# Patient Record
Sex: Male | Born: 1935 | Race: White | Hispanic: No | Smoking: Current every day smoker
Health system: Southern US, Community
[De-identification: ages and names within clinical notes are randomized; demographics above are authoritative.]

## PROBLEM LIST (undated history)

## (undated) DIAGNOSIS — R0981 Nasal congestion: Secondary | ICD-10-CM

## (undated) DIAGNOSIS — I639 Cerebral infarction, unspecified: Secondary | ICD-10-CM

## (undated) DIAGNOSIS — R531 Weakness: Secondary | ICD-10-CM

## (undated) DIAGNOSIS — E119 Type 2 diabetes mellitus without complications: Secondary | ICD-10-CM

## (undated) DIAGNOSIS — M1990 Unspecified osteoarthritis, unspecified site: Secondary | ICD-10-CM

## (undated) DIAGNOSIS — F0390 Unspecified dementia without behavioral disturbance: Secondary | ICD-10-CM

## (undated) DIAGNOSIS — F1910 Other psychoactive substance abuse, uncomplicated: Secondary | ICD-10-CM

## (undated) DIAGNOSIS — C801 Malignant (primary) neoplasm, unspecified: Secondary | ICD-10-CM

## (undated) DIAGNOSIS — I10 Essential (primary) hypertension: Secondary | ICD-10-CM

## (undated) DIAGNOSIS — R39198 Other difficulties with micturition: Secondary | ICD-10-CM

## (undated) HISTORY — DX: Malignant (primary) neoplasm, unspecified: C80.1

## (undated) HISTORY — DX: Unspecified dementia, unspecified severity, without behavioral disturbance, psychotic disturbance, mood disturbance, and anxiety: F03.90

## (undated) HISTORY — DX: Weakness: R53.1

## (undated) HISTORY — DX: Nasal congestion: R09.81

## (undated) HISTORY — DX: Essential (primary) hypertension: I10

## (undated) HISTORY — DX: Other psychoactive substance abuse, uncomplicated: F19.10

## (undated) HISTORY — DX: Cerebral infarction, unspecified: I63.9

## (undated) HISTORY — DX: Other difficulties with micturition: R39.198

## (undated) HISTORY — DX: Unspecified osteoarthritis, unspecified site: M19.90

---

## 1998-07-28 ENCOUNTER — Emergency Department (HOSPITAL_COMMUNITY): Admission: EM | Admit: 1998-07-28 | Discharge: 1998-07-28 | Payer: Self-pay | Admitting: Emergency Medicine

## 2003-09-19 ENCOUNTER — Ambulatory Visit: Admission: RE | Admit: 2003-09-19 | Discharge: 2003-10-24 | Payer: Self-pay | Admitting: Radiation Oncology

## 2003-09-23 ENCOUNTER — Inpatient Hospital Stay (HOSPITAL_COMMUNITY): Admission: EM | Admit: 2003-09-23 | Discharge: 2003-10-19 | Payer: Self-pay | Admitting: *Deleted

## 2003-09-29 ENCOUNTER — Encounter (INDEPENDENT_AMBULATORY_CARE_PROVIDER_SITE_OTHER): Payer: Self-pay | Admitting: *Deleted

## 2003-10-04 ENCOUNTER — Encounter (INDEPENDENT_AMBULATORY_CARE_PROVIDER_SITE_OTHER): Payer: Self-pay | Admitting: Cardiology

## 2003-10-26 ENCOUNTER — Inpatient Hospital Stay (HOSPITAL_COMMUNITY): Admission: EM | Admit: 2003-10-26 | Discharge: 2003-11-01 | Payer: Self-pay | Admitting: Emergency Medicine

## 2003-10-29 DIAGNOSIS — C61 Malignant neoplasm of prostate: Secondary | ICD-10-CM

## 2003-12-05 ENCOUNTER — Encounter: Payer: Self-pay | Admitting: *Deleted

## 2003-12-05 ENCOUNTER — Inpatient Hospital Stay (HOSPITAL_COMMUNITY): Admission: EM | Admit: 2003-12-05 | Discharge: 2003-12-08 | Payer: Self-pay | Admitting: *Deleted

## 2004-10-30 ENCOUNTER — Emergency Department (HOSPITAL_COMMUNITY): Admission: EM | Admit: 2004-10-30 | Discharge: 2004-10-30 | Payer: Self-pay | Admitting: Emergency Medicine

## 2005-02-21 ENCOUNTER — Inpatient Hospital Stay (HOSPITAL_COMMUNITY): Admission: EM | Admit: 2005-02-21 | Discharge: 2005-02-25 | Payer: Self-pay | Admitting: Emergency Medicine

## 2005-08-29 IMAGING — CT CT HEAD W/O CM
1 of 2 series · 15 of 30 positions shown, 19 images · non-contrast
Comparison: none

CLINICAL DATA: Status post fall.  Altered mental status. 
 ROUTINE UNENHANCED CT BRAIN 09/23/03
 Moderate age-related atrophy.  The cerebral and cerebellar parenchyma are symmetric and have a normal appearance.  Negative for extraaxial fluid collections.  The mastoid air cells and sinuses are clear.  
 IMPRESSION
 CT brain is negative for acute intracranial hemorrhage or edema.

[Series 2: brain · axial · 0.47mm/px · z∈[+133,+258]mm · 15 of 38 slices shown, 19 images]
[im 3/38  brain]
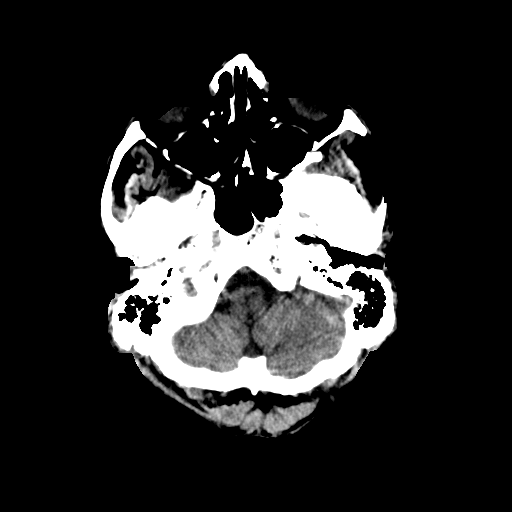
[im 3/38  bone]
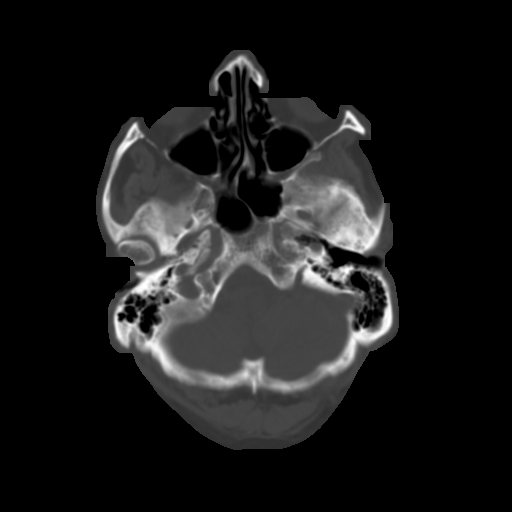
[im 5/38  brain]
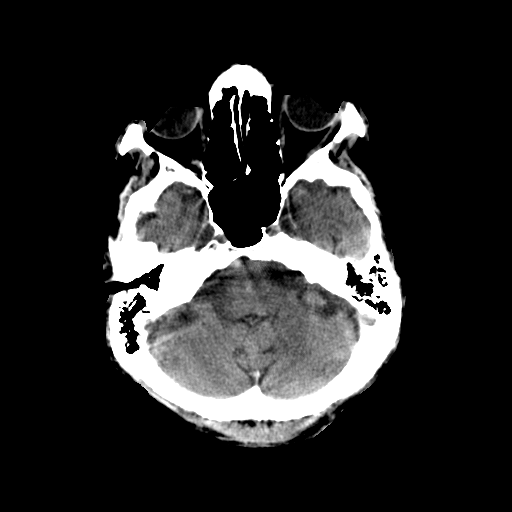
[im 7/38  brain]
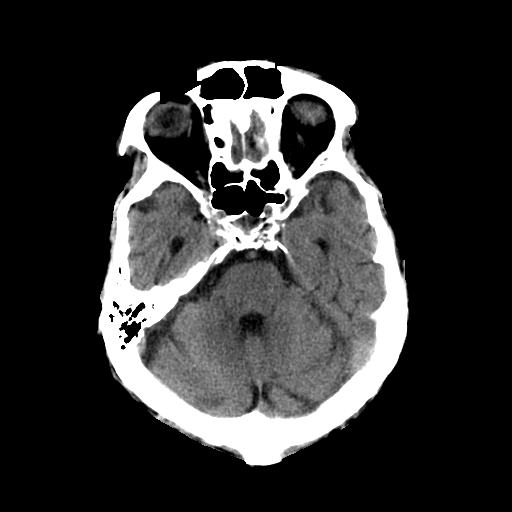
[im 10/38  brain]
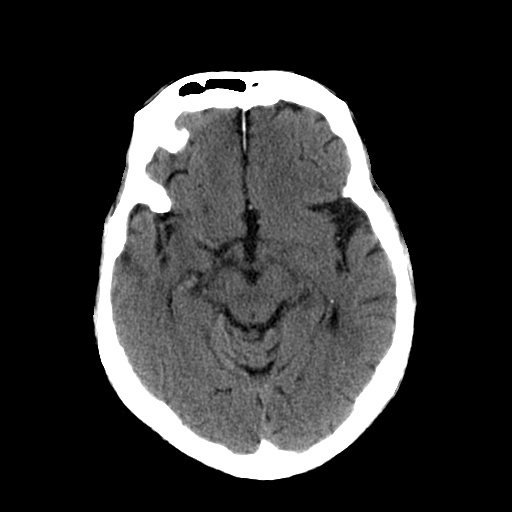
[im 12/38  brain]
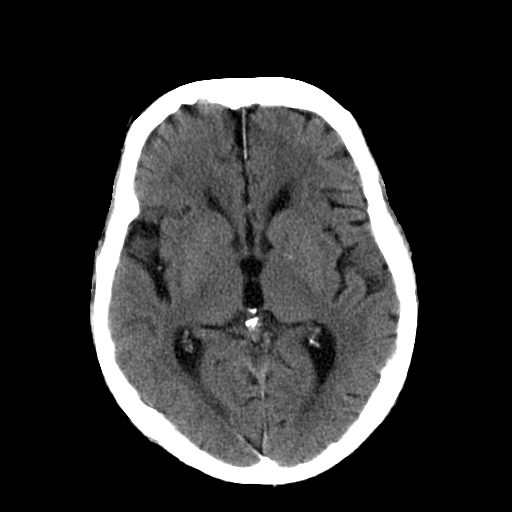
[im 12/38  bone]
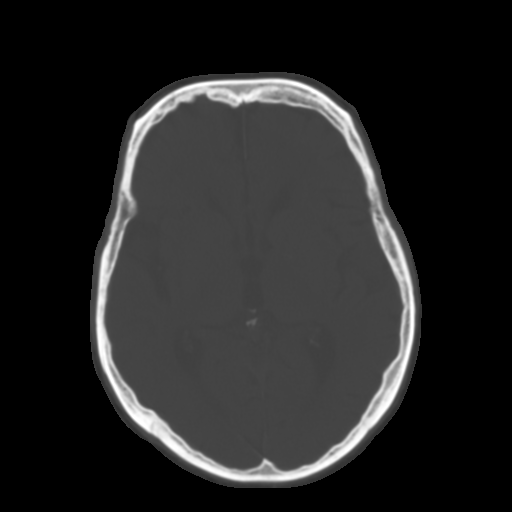
[im 14/38  brain]
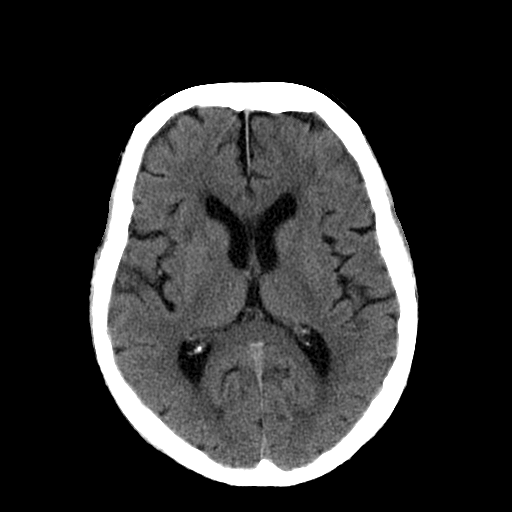
[im 17/38  brain]
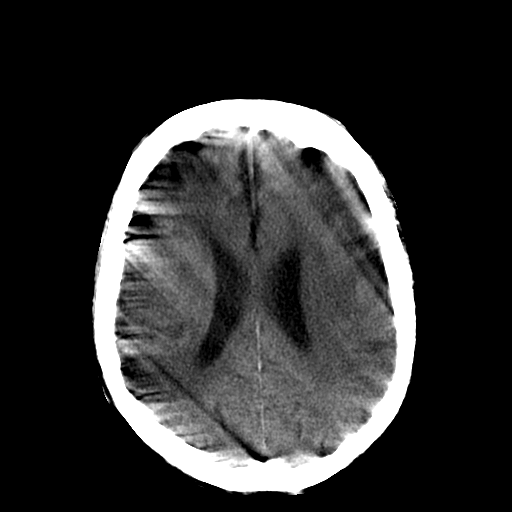
[im 19/38  brain]
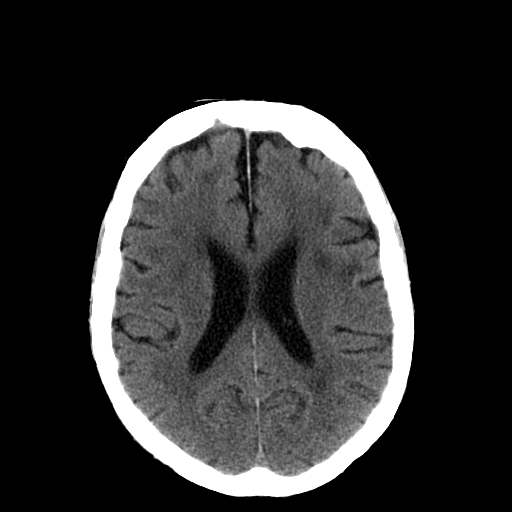
[im 21/38  brain]
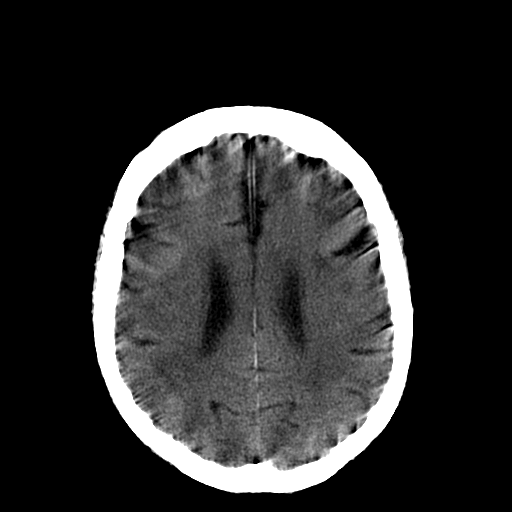
[im 21/38  bone]
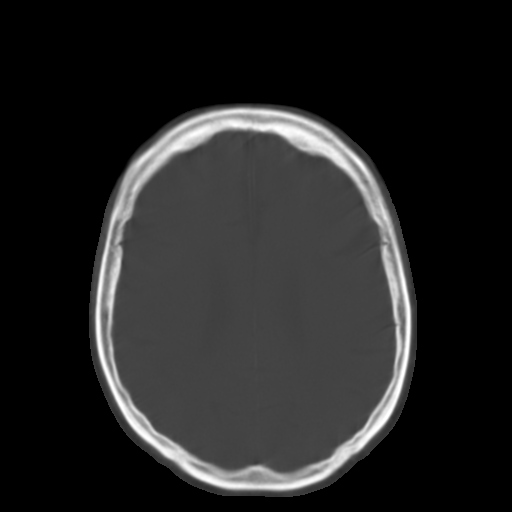
[im 24/38  brain]
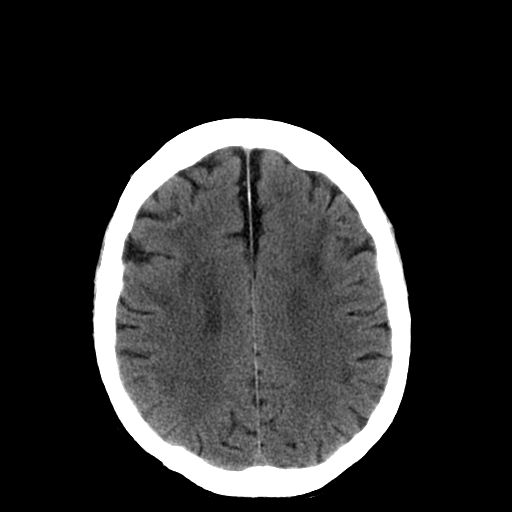
[im 26/38  brain]
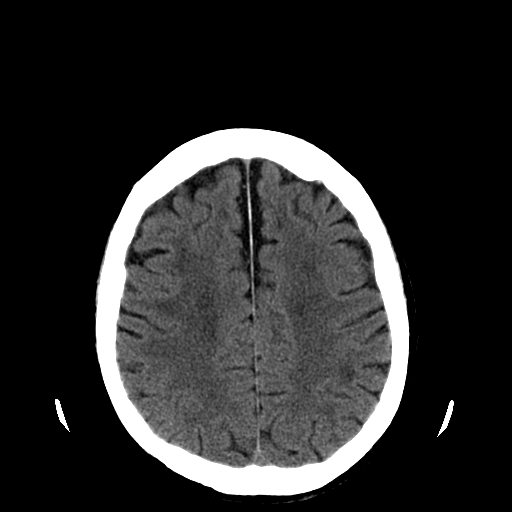
[im 28/38  brain]
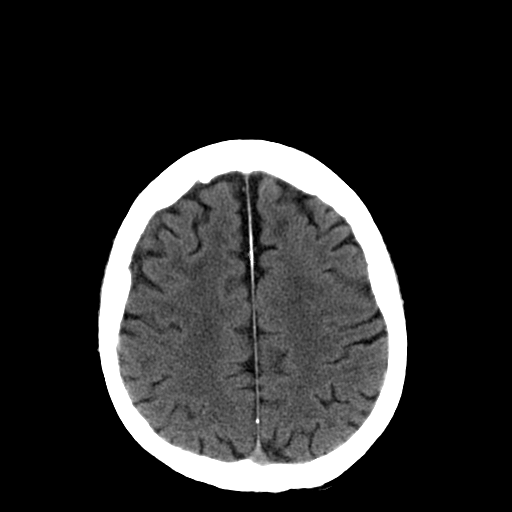
[im 31/38  brain]
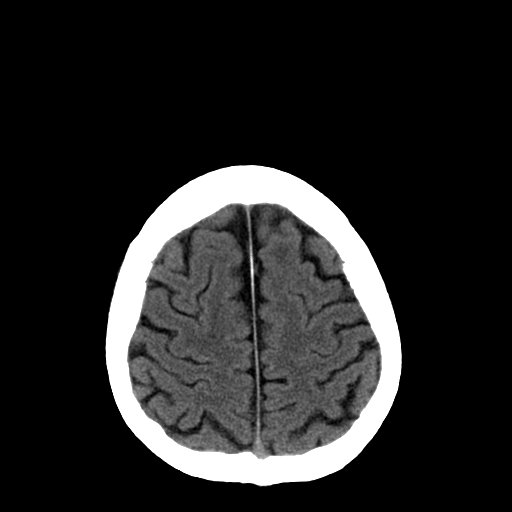
[im 31/38  bone]
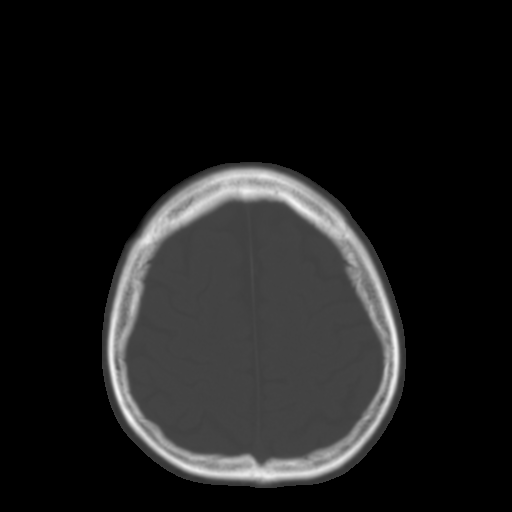
[im 33/38  brain]
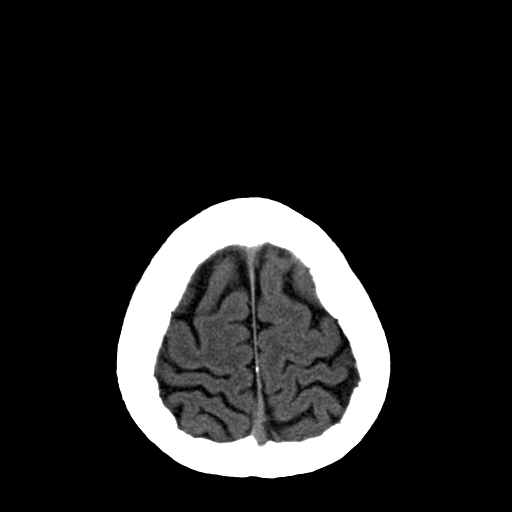
[im 35/38  brain]
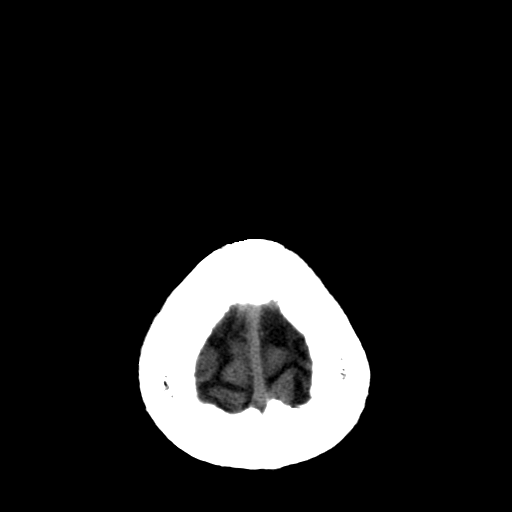

[15 of 30 positions shown; findings below may reference images not displayed]

## 2007-08-14 ENCOUNTER — Ambulatory Visit (HOSPITAL_COMMUNITY): Admission: RE | Admit: 2007-08-14 | Discharge: 2007-08-14 | Payer: Self-pay | Admitting: Urology

## 2007-12-24 ENCOUNTER — Emergency Department (HOSPITAL_COMMUNITY): Admission: EM | Admit: 2007-12-24 | Discharge: 2007-12-24 | Payer: Self-pay | Admitting: Emergency Medicine

## 2009-01-26 ENCOUNTER — Ambulatory Visit: Payer: Self-pay | Admitting: Internal Medicine

## 2009-01-26 DIAGNOSIS — F0280 Dementia in other diseases classified elsewhere without behavioral disturbance: Secondary | ICD-10-CM | POA: Insufficient documentation

## 2009-01-26 DIAGNOSIS — M25579 Pain in unspecified ankle and joints of unspecified foot: Secondary | ICD-10-CM

## 2009-01-26 DIAGNOSIS — G4700 Insomnia, unspecified: Secondary | ICD-10-CM

## 2009-01-26 DIAGNOSIS — R439 Unspecified disturbances of smell and taste: Secondary | ICD-10-CM

## 2009-01-26 DIAGNOSIS — G309 Alzheimer's disease, unspecified: Secondary | ICD-10-CM

## 2009-01-26 LAB — CONVERTED CEMR LAB
CO2: 21 meq/L (ref 19–32)
Calcium: 9.6 mg/dL (ref 8.4–10.5)
Potassium: 4.3 meq/L (ref 3.5–5.3)

## 2009-02-01 ENCOUNTER — Telehealth (INDEPENDENT_AMBULATORY_CARE_PROVIDER_SITE_OTHER): Payer: Self-pay | Admitting: Internal Medicine

## 2009-02-02 ENCOUNTER — Encounter (INDEPENDENT_AMBULATORY_CARE_PROVIDER_SITE_OTHER): Payer: Self-pay | Admitting: Internal Medicine

## 2009-07-11 ENCOUNTER — Encounter: Admission: RE | Admit: 2009-07-11 | Discharge: 2009-07-13 | Payer: Self-pay | Admitting: Neurology

## 2010-01-29 ENCOUNTER — Emergency Department (HOSPITAL_COMMUNITY): Admission: EM | Admit: 2010-01-29 | Discharge: 2010-01-29 | Payer: Self-pay | Admitting: Emergency Medicine

## 2010-03-21 ENCOUNTER — Ambulatory Visit: Payer: Self-pay | Admitting: Internal Medicine

## 2010-03-21 ENCOUNTER — Observation Stay (HOSPITAL_COMMUNITY): Admission: EM | Admit: 2010-03-21 | Discharge: 2010-03-23 | Payer: Self-pay | Admitting: Emergency Medicine

## 2010-03-22 ENCOUNTER — Encounter (INDEPENDENT_AMBULATORY_CARE_PROVIDER_SITE_OTHER): Payer: Self-pay | Admitting: Family Medicine

## 2010-04-04 ENCOUNTER — Telehealth (INDEPENDENT_AMBULATORY_CARE_PROVIDER_SITE_OTHER): Payer: Self-pay | Admitting: *Deleted

## 2010-04-05 ENCOUNTER — Encounter (HOSPITAL_COMMUNITY): Admission: RE | Admit: 2010-04-05 | Discharge: 2010-05-29 | Payer: Self-pay | Admitting: Cardiovascular Disease

## 2010-04-05 ENCOUNTER — Encounter: Payer: Self-pay | Admitting: Cardiovascular Disease

## 2010-04-05 ENCOUNTER — Ambulatory Visit: Payer: Self-pay

## 2010-04-05 ENCOUNTER — Ambulatory Visit: Payer: Self-pay | Admitting: Cardiovascular Disease

## 2010-05-25 ENCOUNTER — Encounter (INDEPENDENT_AMBULATORY_CARE_PROVIDER_SITE_OTHER): Payer: Self-pay | Admitting: *Deleted

## 2010-07-26 ENCOUNTER — Ambulatory Visit: Payer: Self-pay | Admitting: Internal Medicine

## 2010-08-05 ENCOUNTER — Emergency Department (HOSPITAL_COMMUNITY)
Admission: EM | Admit: 2010-08-05 | Discharge: 2010-08-06 | Payer: Self-pay | Source: Home / Self Care | Admitting: Emergency Medicine

## 2010-08-07 ENCOUNTER — Encounter (INDEPENDENT_AMBULATORY_CARE_PROVIDER_SITE_OTHER): Payer: Self-pay | Admitting: Internal Medicine

## 2010-08-09 ENCOUNTER — Telehealth (INDEPENDENT_AMBULATORY_CARE_PROVIDER_SITE_OTHER): Payer: Self-pay | Admitting: Internal Medicine

## 2010-09-04 ENCOUNTER — Ambulatory Visit (HOSPITAL_COMMUNITY): Admission: RE | Admit: 2010-09-04 | Discharge: 2010-09-04 | Payer: Self-pay | Admitting: Urology

## 2010-09-07 ENCOUNTER — Telehealth (INDEPENDENT_AMBULATORY_CARE_PROVIDER_SITE_OTHER): Payer: Self-pay | Admitting: Internal Medicine

## 2010-09-13 ENCOUNTER — Encounter (INDEPENDENT_AMBULATORY_CARE_PROVIDER_SITE_OTHER): Payer: Self-pay | Admitting: Internal Medicine

## 2010-09-23 ENCOUNTER — Ambulatory Visit: Payer: Self-pay | Admitting: Internal Medicine

## 2010-09-23 ENCOUNTER — Observation Stay (HOSPITAL_COMMUNITY): Admission: EM | Admit: 2010-09-23 | Discharge: 2010-09-24 | Payer: Self-pay | Admitting: Emergency Medicine

## 2010-09-28 ENCOUNTER — Ambulatory Visit: Payer: Self-pay | Admitting: Internal Medicine

## 2010-09-28 ENCOUNTER — Telehealth (INDEPENDENT_AMBULATORY_CARE_PROVIDER_SITE_OTHER): Payer: Self-pay | Admitting: Internal Medicine

## 2010-09-28 DIAGNOSIS — R079 Chest pain, unspecified: Secondary | ICD-10-CM

## 2010-09-28 DIAGNOSIS — F1010 Alcohol abuse, uncomplicated: Secondary | ICD-10-CM | POA: Insufficient documentation

## 2010-10-01 ENCOUNTER — Encounter (INDEPENDENT_AMBULATORY_CARE_PROVIDER_SITE_OTHER): Payer: Self-pay | Admitting: Internal Medicine

## 2010-10-28 HISTORY — PX: HERNIA REPAIR: SHX51

## 2010-11-01 ENCOUNTER — Telehealth (INDEPENDENT_AMBULATORY_CARE_PROVIDER_SITE_OTHER): Payer: Self-pay | Admitting: Internal Medicine

## 2010-11-05 ENCOUNTER — Telehealth (INDEPENDENT_AMBULATORY_CARE_PROVIDER_SITE_OTHER): Payer: Self-pay | Admitting: Internal Medicine

## 2010-11-09 ENCOUNTER — Telehealth (INDEPENDENT_AMBULATORY_CARE_PROVIDER_SITE_OTHER): Payer: Self-pay | Admitting: *Deleted

## 2010-11-12 ENCOUNTER — Encounter (INDEPENDENT_AMBULATORY_CARE_PROVIDER_SITE_OTHER): Payer: Self-pay | Admitting: Internal Medicine

## 2010-11-17 ENCOUNTER — Encounter: Payer: Self-pay | Admitting: Urology

## 2010-11-18 ENCOUNTER — Encounter: Payer: Self-pay | Admitting: Urology

## 2010-11-19 ENCOUNTER — Telehealth (INDEPENDENT_AMBULATORY_CARE_PROVIDER_SITE_OTHER): Payer: Self-pay | Admitting: Internal Medicine

## 2010-11-29 NOTE — Letter (Signed)
Summary: REQUESTING RECORDS FROM DR.[facility]  REQUESTING RECORDS FROM DR.ELKINS   Imported By: Silvio Pate Stanislawscyk 08/07/2010 15:42:09  _____________________________________________________________________  External Attachment:    Type:   Image     Comment:   External Document
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
## 2010-11-29 NOTE — Letter (Signed)
Summary: Generic Letter  [facility]  [address] [place], [place] [address]   Phone: [phone]  Fax: [phone]    11/12/2010  Hoveround  Re:  [name]      [address]      [place], [place]  [address]  To Whom It May Concern:  [name] is and 77 yo male who receives his primary medical care at our office.  Please see the enclosed office note from December regarding a mobility exam performed related to his difficulties with ambulation and falls.  He has had multiple falls due to generalized weakness, shortness of breath and balance difficulties in the past year, often with resulting significant injuries requiring ED visits or hospital stays.    We are asking for consideration for him to obtain a Hoveround to decrease his difficulties both with mobility and injury.  Thank you for attention to his concerns.       Sincerely,   Julieanne Manson MD

## 2010-11-29 NOTE — Assessment & Plan Note (Signed)
Summary: TO RENEW HIS MEDS///KT   Vital Signs:  Patient profile:   77 year old male Weight:      123.3 pounds Temp:     98.4 degrees F oral Pulse rate:   88 / minute Pulse rhythm:   regular Resp:     18 per minute BP sitting:   136 / 84  (left arm)  Vitals Entered By: Michelle Nasuti (July 26, 2010 3:49 PM) CC: pt is having trouble falling asleep Pain Assessment Patient in pain? no        CC:  pt is having trouble falling asleep.  History of Present Illness: 1.  Needs meds refilled.  Owes a copay to his primary physician, so is back here.  2.  Would like to get a Hovaround--has had several falls with broken bones.  Just gives out--very tired, dyspneic.  Sometimes loses balance.  Has had a fractured nose,  left ankle, rib fracture since beginning of 2010.    Since the ankle fracture, falls have been worse.  3.  Insomnia:  Chronic issue.  Goes to bed at 8 p.m.  Tosses and turns.  Lies in bed--does not get out.  Thinks he falls asleep about 3-4 in A.M.  Gets up at 8 a.m. and does not feel rested.  Little in way of caffeine.  Does go outside every day for 4-5 hours.  Tries to nap at times.  Lunesta not much help.  Allergies: No Known Drug Allergies  Physical Exam  General:  Thin elderly male, NAD Lungs:  Normal respiratory effort, chest expands symmetrically. Lungs are clear to auscultation, no crackles or wheezes. Heart:  Normal rate and regular rhythm. S1 and S2 normal without gallop, murmur, click, rub or other extra sounds.  Radial pulses normal and equal.   Impression & Recommendations:  Problem # 1:  ALZHEIMER'S DISEASE (ICD-331.0) Encouraged to take Donepezil daily and not miss or use a lower dose. Increase to 10 mg daily. Consider addition of Namenda   Problem # 2:  INSOMNIA (ICD-780.52) Discussed regular exercise, getting outside for sun exposure, avoiding naps and other sleep hygiene improvements. Consider low dose Trazodone if above no help. Consider  having him go to bed later. His updated medication list for this problem includes:    Lunesta 1 Mg Tabs (Eszopiclone) .Marland Kitchen... 1 tab by mouth at bedtime.  only 30 tabs every 30 days  Complete Medication List: 1)  Donepezil Hcl 10 Mg Tabs (Donepezil hcl) .Marland Kitchen.. 1 tab by mouth daily 2)  Lunesta 1 Mg Tabs (Eszopiclone) .Marland Kitchen.. 1 tab by mouth at bedtime.  only 30 tabs every 30 days  Patient Instructions: 1)  Release of information from Dr. Elesa Hacker 2)  Follow up with Dr. Delrae Alfred in 3 months --dementia and insomnia Prescriptions: LUNESTA 1 MG TABS (ESZOPICLONE) 1 tab by mouth at bedtime.  Only 30 tabs every 30 days  #30 x 1   Entered and Authorized by:   Julieanne Manson MD   Signed by:   Julieanne Manson MD on 07/26/2010   Method used:   Print then Give to Patient   RxID:   [id number] DONEPEZIL HCL 10 MG TABS (DONEPEZIL HCL) 1 tab by mouth daily  #30 x 4   Entered and Authorized by:   Julieanne Manson MD   Signed by:   Julieanne Manson MD on 07/26/2010   Method used:   [facility]  [address] [phone]  [address]       [place], [place]  [address]       Ph: [phone] or [phone]       Fax: [phone]   RxID:   [id number]

## 2010-11-29 NOTE — Letter (Signed)
Summary: [name] Rice   Imported By: Arta Bruce 10/01/2010 16:14:00  _____________________________________________________________________  External Attachment:    Type:   Image     Comment:   External Document

## 2010-11-29 NOTE — Progress Notes (Signed)
Summary: Nuclear Pre-Procedure  Phone Note Outgoing Call   Call placed by: Milana Na, EMT-P,  April 04, 2010 2:46 PM Summary of Call: Reviewed information on Myoview Information Sheet (see scanned document for further details).  No Answer     Nuclear Med Background Indications for Stress Test: Evaluation for Ischemia, Post Hospital  Indications Comments: 03/21/10 Atypical CP (-) enzymes x3  History: COPD, Echo  History Comments: 05/11 ECHO EF 60-65% COPD  Symptoms: Chest Pain, SOB    Nuclear Pre-Procedure Cardiac Risk Factors: Lipids, NIDDM, RBBB Height (in): 63  Nuclear Med Study Referring MD:  P.Sara Lee

## 2010-11-29 NOTE — Progress Notes (Signed)
Summary: ARE HOVEAROUND FORMS READY  Phone Note Call from Patient Call back at Home Phone [phone]   Caller: [name] - AID Summary of Call: [name] CALLED TO SEE IF THE HOVEAROUND FORMS ARE FILLED OUT. THEY NEED TO BE FAXED TO [phone] NO LATER THAN MONDAY THE 16th AND IF THEY DON'T GET THEM BY MONDA, SHE WAS TOLD THAT HE WOULD HAVE TO COME BACK IN AND START OVER. Initial call taken by: Leodis Rains,  November 09, 2010 3:50 PM  Follow-up for Phone Call        Let them know I apologize.  I thought the note for the evaluation was already sent. See office note from 12.2/11--this needs to be faxed today to the number above along with the letter dated today. Follow-up by: Julieanne Manson MD,  November 12, 2010 3:30 PM  Additional Follow-up for Phone Call Additional follow up Details #1::        FAXED REQUESTED DOCUMENTS Additional Follow-up by: Arta Bruce,  November 12, 2010 3:56 PM
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
## 2010-11-29 NOTE — Letter (Signed)
Summary: [employer]IES  [employer]IES   Imported By: Arta Bruce 09/13/2010 11:13:53  _____________________________________________________________________  External Attachment:    Type:   Image     Comment:   External Document

## 2010-11-29 NOTE — Progress Notes (Signed)
  Phone Note Outgoing Call   Summary of Call: Please let Mr. Lenderman and his caregiver know that the paperwork for the [facility] was received--there are many things we will have to address and have documented in his chart to make sure he is qualified--if I have an opening sooner than 12/29, please see if can get him in sooner to evaluate for this Initial call taken by: Julieanne Manson MD,  August 09, 2010 6:12 PM  Follow-up for Phone Call        pt and pts caregiver would like to keep appt as it is scheduled Follow-up by: Michelle Nasuti,  August 13, 2010 4:23 PM

## 2010-11-29 NOTE — Progress Notes (Signed)
  Phone Note Call from Patient   Summary of Call: spoke with pts nurse Toni Amend and she said that the pharmacy says the trazadone refill can not be picked up til Jan. 18.... nurse said provider told them that if they runs  out to call clinic and provider will call pharmacy to get med refilled earlier.... nurse says he is taking three pills to go to sleep... nurse says he is not drinking but she is not there 24 hours daily... [facility] nurse says they have to step out to run errands that if they dont answer the phone they will just give pharmacy a call to see if script was sent Initial call taken by: Armenia Shannon,  November 05, 2010 3:24 PM  Follow-up for Phone Call        Increased Rx sent. Follow-up by: Julieanne Manson MD,  November 05, 2010 10:30 PM  Additional Follow-up for Phone Call Additional follow up Details #1::        pt aware. Additional Follow-up by: Levon Hedger,  November 06, 2010 11:58 AM    New/Updated Medications: TRAZODONE HCL 50 MG TABS (TRAZODONE HCL) 3 tabs by mouth at bedtime Prescriptions: TRAZODONE HCL 50 MG TABS (TRAZODONE HCL) 3 tabs by mouth at bedtime  #90 x 2   Entered and Authorized by:   Julieanne Manson MD   Signed by:   Julieanne Manson MD on 11/05/2010   Method used:   [facility]  [address] [id number]       [address]       [place], [place]  [address]       Ph: [phone] or [phone]       Fax: [phone]   RxID:   [id number]

## 2010-11-29 NOTE — Progress Notes (Signed)
Summary: meds refill-Trazodone  Phone Note Refill Request   Refills Requested: Medication #1:  TRAZODONE HCL 50 MG TABS 1 tab by mouth at bedtime.  May increase by 50 mg every 24 hours to max of 200 mg if no response.[address]    Initial call taken by: Domenic Polite,  November 01, 2010 4:43 PM  Follow-up for Phone Call        Advised pt. of available refills and for him to call pharmacy-verbalized agreement.  Dutch Quint RN  November 01, 2010 5:51 PM

## 2010-11-29 NOTE — Progress Notes (Signed)
Summary: In home care  Phone Note Outgoing Call   Summary of Call: Nora--needs referral through Medicare for in home care--feel he cannot live alone at this point with dementia, alcoholism, frequent falls.  He already has an in home care person, but is not being covered through Medicare.  Also, please fax a request to [facility] for an application for one of their scooters.  Actually--may need to call, as cannot find a fax number [phone] [phone] Initial call taken by: Julieanne Manson MD,  September 28, 2010 1:25 PM  Follow-up for Phone Call        I SEND THE REFERRAL TO [facility]  I WILL F/U .Marland KitchenCheryll Dessert  October 10, 2010 6:12 PM [facility] call me back and sue the nurse told me that they can't help him because his medicare is evercare and i will try another places .Marland KitchenCheryll Dessert  October 12, 2010 9:30 AM  * I call [facility] and the nurse told me that medicare [employer] don't pay for personal care or aid  the only want that they pay for is for a bath visit  I don't know where else to call .Marland KitchenCheryll Dessert  October 12, 2010 11:01 AM     Additional Follow-up for Phone Call Additional follow up Details #1::        Can you check with [facility] --not sure if he qualifies for them--see if they are aware of how to get his care covered--if they can get involved.  Julieanne Manson MD  October 17, 2010 1:05 PM  * I talk to [facility] she said that right know they are not taking medicare and maybe next year they will and she will let us know. I ask her if she knows someone and she said nobody take medicare  only both medicaid or medicaid  Additional Follow-up by: Cheryll Dessert,  October 18, 2010 9:01 AM    Additional Follow-up for Phone Call Additional follow up Details #2::    Nora--I think I need to redefine what needs to be done--He already has in home care, we are just looking for coverage.  Is there anything within Medicare we can check to see if his current home caregiver can be  covered?  I don't want another home health group involved, just would like coverage for what he has currently.  Julieanne Manson MD  October 25, 2010 8:39 AM   I talk to Reita Cliche the aid of Mr Gardella she get paid out of pocket  she told me that the [employer] [place] give her a name that they work basic sliding scale fee and is thru the help dept Toni Amend is suppose to call me back with the information so we can referral mr Jensen there she also mentioned that they probably need a letter from Dr Delrae Alfred about his medical condition. I still waiting for the nurse .Marland KitchenCheryll Dessert  October 31, 2010 4:43 PM I call Mr Lightsey to talk to his aid Ms Randa Evens and she call sick I still waiting for her information I will try back tomorrow .Marland KitchenCheryll Dessert  November 07, 2010 4:27 PM   Additional Follow-up for Phone Call Additional follow up Details #3:: Details for Additional Follow-up Action Taken: Ms Ellie Lunch call me and told me that she and Mr Lovelady they talk to the Medicared lady ans she said that in other for him to get the services with them he need to get another nurse aid so and  Mr Maldonado said nothat he wants Ms Randa Evens to keep been his aid   and she is happy working with him. Additional Follow-up by: Cheryll Dessert,  November 08, 2010 3:14 PM

## 2010-11-29 NOTE — Letter (Signed)
Summary: Appointment - Missed  [facility], Main Office  [address]   [place], [place] [address]   Phone: [phone]  Fax: [phone]     May 25, 2010 MRN: [id number]   [facility] [address] [place], [place]  [address]   Dear Mr. Lukacs,  Our records indicate you missed your appointment on  05/15/10 with Dr.Nishan . It is very important that we reach you to reschedule this appointment. We look forward to participating in your health care needs. Please contact us at the number listed above at your earliest convenience to reschedule this appointment.     Sincerely, Artist

## 2010-11-29 NOTE — Assessment & Plan Note (Signed)
Summary: Cardiology Nuclear Study  Nuclear Med Background Indications for Stress Test: Evaluation for Ischemia, Post Hospital  Indications Comments: 03/21/10 Atypical CP, (-) enzymes x3  History: COPD, Echo  History Comments: 05/11 Echo:EF=60-65%  Symptoms: Chest Pressure, DOE, Fatigue, Light-Headedness, Rapid HR, SOB  Symptoms Comments: Poor historian   Nuclear Pre-Procedure Cardiac Risk Factors: Family History - CAD, History of Smoking, Lipids, NIDDM, RBBB Caffeine/Decaff Intake: 10:30 am NPO After: 8:00 PM Lungs: Clear.  O2 Sat 94% on RA. IV 0.9% NS with Angio Cath: 20g     IV Site: (R) wrist area IV Started by: Stanton Kidney EMT-P Chest Size (in) 40     Height (in): 63 Weight (lb): 125 BMI: 22.22 Tech Comments: Patient was scheduled for lexiscan, but due to caffeine and transportation issues, he was changed to dobutamine.  Nuclear Med Study 1 or 2 day study:  1 day     Stress Test Type:  Dobutamine Reading MD:  Charlton Haws, MD     Referring MD:  Charlton Haws, MD Resting Radionuclide:  Technetium 26m Tetrofosmin     Resting Radionuclide Dose:  9.0 mCi  Stress Radionuclide:  Technetium 60m Tetrofosmin     Stress Radionuclide Dose:  32.0 mCi   Stress Protocol Exercise Time (min):  12:30 min     Max HR:  134 bpm     Predicted Max HR:  137 bpm  Max Systolic BP: 125 mm Hg     Percent Max HR:  97.81 %Rate Pressure Product:  98119  Dose of Atropine: 0.25 mg  Dose of Dobutamine:  40 mcg/kg/min (at max HR)  Stress Test Technologist:  Rea [employer] CMA-N     Nuclear Technologist:  Burna Mortimer Deal RT-N  Rest Procedure  Myocardial perfusion imaging was performed at rest 45 minutes following the intravenous administration of Myoview Technetium 63m Tetrofosmin.  Stress Procedure  The patient received IV dobutamine and 0.25 mg IV atropine. There were no significant changes with infusion, only occasional PVC's. Myoview was injected at peak heart rate and quantitative spect images were  obtained after a 45 minute delay.  QPS Raw Data Images:  Normal; no motion artifact; normal heart/lung ratio. Stress Images:  NI: Uniform and normal uptake of tracer in all myocardial segments. Rest Images:  Normal homogeneous uptake in all areas of the myocardium. Subtraction (SDS):  Normal Transient Ischemic Dilatation:  1.28  (Normal <1.22)  Lung/Heart Ratio:  .34  (Normal <0.45)  Quantitative Gated Spect Images QGS EDV:  51 ml QGS ESV:  22 ml QGS EF:  57 % QGS cine images:  normal  Findings Normal nuclear study      Overall Impression  Exercise Capacity: Dobutamine study with no exercise. BP Response: Normal blood pressure response. Clinical Symptoms: No chest pain ECG Impression: No significant ST segment change suggestive of ischemia. Overall Impression: Normal stress nuclear study.  Appended Document: Cardiology Nuclear Study normal nuclear study  Appended Document: Cardiology Nuclear Study pt aware of results

## 2010-12-05 NOTE — Progress Notes (Signed)
  Phone Note Call from Patient   Summary of Call: nurse called to let us know that hooverround has to start over again because provider did not send it in on time and left out the prescription order to recieve the hooverround.... they are scheduled to come back on Feb 9 to reapply for the hoverround again... nurse said they semi-closed his case til next office visit Initial call taken by: Arm[place] Shannon,  November 19, 2010 2:52 PM  Follow-up for Phone Call        Called Mr. Tyminski--[name], his caregiver not there currenly.  Tried to cal her cell, but temporarily not in service.l I previously called Hover[employer] and in discussion, became evident that we did not receive the provider's information for filling out the paperwork--did not realize they required a prescription as I was going by the paperwork the pt; brought in.  The letter required was actually faxed on the 16th They are sending the required paperwork Follow-up by: Julieanne Manson MD,  November 21, 2010 2:03 PM

## 2010-12-06 ENCOUNTER — Encounter: Payer: Self-pay | Admitting: Internal Medicine

## 2010-12-06 ENCOUNTER — Encounter (INDEPENDENT_AMBULATORY_CARE_PROVIDER_SITE_OTHER): Payer: Self-pay | Admitting: Internal Medicine

## 2010-12-06 DIAGNOSIS — R0602 Shortness of breath: Secondary | ICD-10-CM | POA: Insufficient documentation

## 2010-12-06 DIAGNOSIS — R269 Unspecified abnormalities of gait and mobility: Secondary | ICD-10-CM | POA: Insufficient documentation

## 2010-12-07 ENCOUNTER — Encounter (INDEPENDENT_AMBULATORY_CARE_PROVIDER_SITE_OTHER): Payer: Self-pay | Admitting: Internal Medicine

## 2010-12-10 ENCOUNTER — Encounter (INDEPENDENT_AMBULATORY_CARE_PROVIDER_SITE_OTHER): Payer: Self-pay | Admitting: Internal Medicine

## 2010-12-13 NOTE — Letter (Signed)
Summary: HOVEROUND//FAXED  HOVEROUND//FAXED   Imported By: Arta Bruce 12/07/2010 10:45:04  _____________________________________________________________________  External Attachment:    Type:   Image     Comment:   External Document

## 2010-12-13 NOTE — Assessment & Plan Note (Signed)
Summary: Paperwork for hooveround   Vital Signs:  Patient profile:   77 year old male Height:      63 inches (160.02 cm) Weight:      127.38 pounds (57.90 kg) Temp:     96.7 degrees F (35.94 degrees C) oral Pulse rate:   90 / minute Pulse rhythm:   regular Resp:     28 per minute BP sitting:   102 / 58  (left arm) Cuff size:   regular  Vitals Entered By: Hale Drone CMA (December 06, 2010 3:59 PM) CC: Needs paperwork for [facility] completed. Paperwork was not rec'd on time last time it was completed. Having back pain. Having problems w/Trazadon. When he wakes up he feels like he is drunk and falling over.  Is Patient Diabetic? No Pain Assessment Patient in pain? yes     Location: back Intensity: 8 Type: thobbing  Does patient need assistance? Functional Status Self care Ambulation Normal   CC:  Needs paperwork for Hoveround completed. Paperwork was not rec'd on time last time it was completed. Having back pain. Having problems w/Trazadon. When he wakes up he feels like he is drunk and falling over. Marland Kitchen  History of Present Illness:  Pt. here again for mobility evaluation--never received paperwork to complete in time previously.  Pt. has had multiple falls in past year and states he just feels weak at times.  Falls going to the mailbox--1/8 of mile to mailbox.  States does not trip, legs just give out from weakness.  Also, gets dyspneic with walking around in house frequently.   Impacts life in home with getting to the bathroom without a fall or in a timely fashion to prevent soiling or wetting clothes.  Difficulty preparing a small simple meal with falls moving back and forth in kitchen.  Difficulty answering the phone.  Has had several broken bones requiring hospital or ED visits following falls.  Needs to be able to go from the different living areas of his home:  kitchen, living room, bathroom without falling and without getting dyspneic. Lives in double wide mobile home and  space too limited for regular scooter.   Pt. already using a cane and still having above difficulties.   He has used a walker in the past, and was not strong enough to use--became weaker and dyspneic more quickly. Has used a manual wheelchair in the past, and again, does not have the strength nor respiratory capacity to use adequately.   Caregiver states she feels pt. will be able to operate a power wheelchair at home.   He states he is very willing and motivated to learn use of power wheelchair. Plan would be for ramp to be built at steps for pt. to safely use power wheelchair to mailbox, etc.   He promises not use to mobilize beyond mailbox without caregiver's supervision.  Current Medications (verified): 1)  Donepezil Hcl 10 Mg Tabs (Donepezil Hcl) .Marland Kitchen.. 1 Tab By Mouth Daily 2)  Omeprazole 20 Mg Cpdr (Omeprazole) .... Take One Tablet By Mouth Daily 3)  Thiamine Hcl 100 Mg Tabs (Thiamine Hcl) .... Take One Tablet By Mouth Daily 4)  Folic Acid 1 Mg Tabs (Folic Acid) .... Take One Tablet By Mouth Daily 5)  Zofran 4 Mg Tabs (Ondansetron Hcl) .... Take One Tablet By Mouth Two Times A Day 6)  Trazodone Hcl 50 Mg Tabs (Trazodone Hcl) .... 3 Tabs By Mouth At Bedtime  Allergies (verified): No Known Drug Allergies  Physical Exam  General:  NAD, but generalized weakness with walking in hallway. Desaturates to 87% with walking 25 feet, has to stop every 4-5 steps as feels weak and unsteady. Lungs:  Normal respiratory effort, chest expands symmetrically. Lungs are clear to auscultation, no crackles or wheezes. Heart:  Normal rate and regular rhythm. S1 and S2 normal without gallop, murmur, click, rub or other extra sounds.  Radial pulses normal and equal Neurologic:  Unsteady gait, even with cane walking in hallway.  Has definite decreased strength throughout all limbs at 4/5 strength.   cranial nerves II-XII intact and DTRs symmetrical and normal.     Impression & Recommendations:  Problem # 1:   GAIT DISTURBANCE (ICD-781.2) Feel pt. would definitely benefit from use of power wheelchair in home--hopefully will help avoid further injury and ability to perform more of ADLs.  Problem # 2:  ACCIDENTAL FALLS, RECURRENT (ICD-E888.9) Multiple falls in past 1 1/2 years resulting in broken bones, injuries.  Problem # 3:  DYSPNEA (ICD-786.05) With desaturation to 87% with walking a short distance in clinic--suspect due to COPD. Pt. has upcoming visit where we will readdress this issue, but will certainly benefit with motorized wheelchair regarding this as well.  Complete Medication List: 1)  Donepezil Hcl 10 Mg Tabs (Donepezil hcl) .Marland Kitchen.. 1 tab by mouth daily 2)  Omeprazole 20 Mg Cpdr (Omeprazole) .... Take one tablet by mouth daily 3)  Thiamine Hcl 100 Mg Tabs (Thiamine hcl) .... Take one tablet by mouth daily 4)  Folic Acid 1 Mg Tabs (Folic acid) .... Take one tablet by mouth daily 5)  Zofran 4 Mg Tabs (Ondansetron hcl) .... Take one tablet by mouth two times a day 6)  Trazodone Hcl 50 Mg Tabs (Trazodone hcl) .... 3 tabs by mouth at bedtime 7)  Hoveround Group 2 Power Wheelchair  .... Date of exam:  12/06/10 dx:  dyspnea, gait disturbance, recurrent falls length of need:  99 months weight:  127 lbs  Patient Instructions: 1)  OV next available to discuss meds. Prescriptions: HOVEROUND GROUP 2 POWER WHEELCHAIR Date of exam:  12/06/10 DX:  Dyspnea, gait disturbance, recurrent falls Length of Need:  99 months Weight:  127 lbs  #1 x 0   Entered and Authorized by:   Julieanne Manson MD   Signed by:   Julieanne Manson MD on 12/07/2010   Method used:   Printed then faxed to ...       [facility]  [address] [id number] ([facility])       [address]       [place], [place]  [address]       Ph: [phone] or [phone]       Fax: [phone]   RxID:   [id number] DONEPEZIL HCL 10 MG TABS (DONEPEZIL HCL) 1 tab by mouth daily  #30 x 4   Entered and Authorized by:   Julieanne Manson MD   Signed by:   Julieanne Manson MD on 12/06/2010   Method used:   Electronically to        [facility]  [address] [id number] ([facility]       [address]       [place], [place]  [address]       Ph: [phone] or [phone]       Fax: [phone]   RxID:   [id number]    Orders Added: 1)  Est. Patient Level III [51761] 2)  Est. Patient Level IV [id number]

## 2010-12-18 ENCOUNTER — Encounter (INDEPENDENT_AMBULATORY_CARE_PROVIDER_SITE_OTHER): Payer: Self-pay | Admitting: Internal Medicine

## 2010-12-18 ENCOUNTER — Ambulatory Visit: Payer: Medicare Other | Attending: Radiation Oncology | Admitting: Radiation Oncology

## 2010-12-18 DIAGNOSIS — C61 Malignant neoplasm of prostate: Secondary | ICD-10-CM | POA: Insufficient documentation

## 2010-12-18 DIAGNOSIS — K219 Gastro-esophageal reflux disease without esophagitis: Secondary | ICD-10-CM | POA: Insufficient documentation

## 2010-12-18 DIAGNOSIS — E785 Hyperlipidemia, unspecified: Secondary | ICD-10-CM | POA: Insufficient documentation

## 2010-12-18 DIAGNOSIS — Z87891 Personal history of nicotine dependence: Secondary | ICD-10-CM | POA: Insufficient documentation

## 2010-12-18 DIAGNOSIS — E119 Type 2 diabetes mellitus without complications: Secondary | ICD-10-CM | POA: Insufficient documentation

## 2010-12-18 DIAGNOSIS — Z79899 Other long term (current) drug therapy: Secondary | ICD-10-CM | POA: Insufficient documentation

## 2010-12-18 DIAGNOSIS — Z809 Family history of malignant neoplasm, unspecified: Secondary | ICD-10-CM | POA: Insufficient documentation

## 2010-12-19 NOTE — Letter (Signed)
Summary: HOVEROUND//FAXED  HOVEROUND//FAXED   Imported By: Arta Bruce 12/10/2010 10:16:54  _____________________________________________________________________  External Attachment:    Type:   Image     Comment:   External Document

## 2010-12-28 ENCOUNTER — Encounter (INDEPENDENT_AMBULATORY_CARE_PROVIDER_SITE_OTHER): Payer: Self-pay | Admitting: Internal Medicine

## 2011-01-03 NOTE — Letter (Signed)
Summary: [facility] ONCOLOGY  RADIATION ONCOLOGY   Imported By: Arta Bruce 12/28/2010 10:16:55  _____________________________________________________________________  External Attachment:    Type:   Image     Comment:   External Document

## 2011-01-03 NOTE — Miscellaneous (Signed)
Summary: PRostate cancer update--Dr. Chinita Greenland onc  Clinical Lists Changes  Problems: Changed problem from PROSTATE CANCER, HX OF (ICD-V10.46) - Dr. at [facility] to ADENOCARCINOMA, PROSTATE, GLEASON GRADE 7 (ICD-185) - Dr. Laverle Patter:   Stage T3a Considering IMRT--Last seen 12/18/10 by Dr. Dayton Scrape, [facility]

## 2011-01-08 LAB — POCT CARDIAC MARKERS
CKMB, poc: <1 ng/mL — ABNORMAL LOW (ref 1.0–8.0)
Myoglobin, poc: 98.1 ng/mL (ref 12–200)
Myoglobin, poc: 98.1 ng/mL (ref 12–200)

## 2011-01-08 LAB — URINALYSIS, ROUTINE W REFLEX MICROSCOPIC
Hgb urine dipstick: NEGATIVE
Ketones, ur: NEGATIVE mg/dL
Protein, ur: NEGATIVE mg/dL
Specific Gravity, Urine: 1.015 (ref 1.005–1.030)
Urobilinogen, UA: 0.2 mg/dL (ref 0.0–1.0)
pH: 6 (ref 5.0–8.0)

## 2011-01-08 LAB — CBC
HCT: 41.2 % (ref 39.0–52.0)
HCT: 42.7 % (ref 39.0–52.0)
Hemoglobin: 14.1 g/dL (ref 13.0–17.0)
Hemoglobin: 14.5 g/dL (ref 13.0–17.0)
MCH: 31.7 pg (ref 26.0–34.0)
MCH: 31.8 pg (ref 26.0–34.0)
MCHC: 34 g/dL (ref 30.0–36.0)
MCHC: 34.2 g/dL (ref 30.0–36.0)
MCV: 92.8 fL (ref 78.0–100.0)
MCV: 93.2 fL (ref 78.0–100.0)
Platelets: 280 10*3/uL (ref 150–400)
RBC: 4.44 MIL/uL (ref 4.22–5.81)
RDW: 13.7 % (ref 11.5–15.5)
WBC: 5.6 10*3/uL (ref 4.0–10.5)

## 2011-01-08 LAB — BASIC METABOLIC PANEL
BUN: 16 mg/dL (ref 6–23)
Chloride: 105 mEq/L (ref 96–112)
Creatinine, Ser: 0.96 mg/dL (ref 0.4–1.5)
GFR calc Af Amer: >60 mL/min (ref >60)
GFR calc Af Amer: >60 mL/min (ref >60)
GFR calc non Af Amer: >60 mL/min (ref >60)
GFR calc non Af Amer: >60 mL/min (ref >60)
Sodium: 139 mEq/L (ref 135–145)

## 2011-01-08 LAB — DIFFERENTIAL
Eosinophils Absolute: 0.4 10*3/uL (ref 0.0–0.7)
Monocytes Absolute: 0.5 10*3/uL (ref 0.1–1.0)
Neutro Abs: 2.7 10*3/uL (ref 1.7–7.7)
Neutrophils Relative %: 55 % (ref 43–77)

## 2011-01-08 LAB — CARDIAC PANEL(CRET KIN+CKTOT+MB+TROPI)
CK, MB: 1.3 ng/mL (ref 0.3–4.0)
CK, MB: 1.4 ng/mL (ref 0.3–4.0)
Relative Index: 1.7 (ref 0.0–2.5)
Relative Index: INVALID (ref 0.0–2.5)
Relative Index: INVALID (ref 0.0–2.5)
Total CK: 88 U/L (ref 7–232)
Troponin I: 0.03 ng/mL (ref 0.00–0.06)

## 2011-01-08 LAB — LIPID PANEL
HDL: 50 mg/dL (ref >39)
LDL Cholesterol: 150 mg/dL — ABNORMAL HIGH (ref 0–99)

## 2011-01-08 LAB — PROTIME-INR: INR: 0.92 (ref 0.00–1.49)

## 2011-01-09 LAB — CBC
MCHC: 35 g/dL (ref 30.0–36.0)
MCV: 92.5 fL (ref 78.0–100.0)
Platelets: 322 10*3/uL (ref 150–400)

## 2011-01-09 LAB — DIFFERENTIAL
Basophils Relative: 0 % (ref 0–1)
Eosinophils Absolute: 0 10*3/uL (ref 0.0–0.7)
Monocytes Absolute: 0.8 10*3/uL (ref 0.1–1.0)
Monocytes Relative: 7 % (ref 3–12)

## 2011-01-09 LAB — ETHANOL: Alcohol, Ethyl (B): <5 mg/dL (ref 0–10)

## 2011-01-09 LAB — COMPREHENSIVE METABOLIC PANEL
AST: 30 U/L (ref 0–37)
Albumin: 4 g/dL (ref 3.5–5.2)
Alkaline Phosphatase: 77 U/L (ref 39–117)
BUN: 14 mg/dL (ref 6–23)
CO2: 23 mEq/L (ref 19–32)
Calcium: 9.3 mg/dL (ref 8.4–10.5)
Chloride: 102 mEq/L (ref 96–112)
Creatinine, Ser: 1.02 mg/dL (ref 0.4–1.5)
GFR calc Af Amer: >60 mL/min (ref >60)
GFR calc non Af Amer: >60 mL/min (ref >60)
Potassium: 3.5 mEq/L (ref 3.5–5.1)
Total Protein: 7.5 g/dL (ref 6.0–8.3)

## 2011-01-09 LAB — URINALYSIS, ROUTINE W REFLEX MICROSCOPIC
Bilirubin Urine: NEGATIVE
Glucose, UA: NEGATIVE mg/dL
Nitrite: NEGATIVE
Urobilinogen, UA: 0.2 mg/dL (ref 0.0–1.0)

## 2011-01-09 LAB — URINE MICROSCOPIC-ADD ON

## 2011-01-09 LAB — APTT: aPTT: 22 seconds — ABNORMAL LOW (ref 24–37)

## 2011-01-09 LAB — PROTIME-INR: INR: 0.91 (ref 0.00–1.49)

## 2011-01-14 LAB — BASIC METABOLIC PANEL
Calcium: 9.3 mg/dL (ref 8.4–10.5)
Chloride: 106 mEq/L (ref 96–112)
Creatinine, Ser: 1.03 mg/dL (ref 0.4–1.5)
GFR calc Af Amer: >60 mL/min (ref >60)
Sodium: 138 mEq/L (ref 135–145)

## 2011-01-14 LAB — POCT CARDIAC MARKERS
CKMB, poc: <1 ng/mL — ABNORMAL LOW (ref 1.0–8.0)
Troponin i, poc: <0.05 ng/mL (ref 0.00–0.09)

## 2011-01-14 LAB — TROPONIN I: Troponin I: 0.01 ng/mL (ref 0.00–0.06)

## 2011-01-14 LAB — LIPID PANEL
Cholesterol: 214 mg/dL — ABNORMAL HIGH (ref 0–200)
LDL Cholesterol: 116 mg/dL — ABNORMAL HIGH (ref 0–99)
VLDL: 35 mg/dL (ref 0–40)

## 2011-01-14 LAB — CARDIAC PANEL(CRET KIN+CKTOT+MB+TROPI)
CK, MB: 1.6 ng/mL (ref 0.3–4.0)
Relative Index: INVALID (ref 0.0–2.5)
Total CK: 66 U/L (ref 7–232)
Troponin I: <0.01 ng/mL (ref 0.00–0.06)

## 2011-01-14 LAB — CBC
Hemoglobin: 14.6 g/dL (ref 13.0–17.0)
MCV: 98.1 fL (ref 78.0–100.0)
RBC: 4.33 MIL/uL (ref 4.22–5.81)
WBC: 5.9 10*3/uL (ref 4.0–10.5)

## 2011-01-14 LAB — CK TOTAL AND CKMB (NOT AT ARMC): CK, MB: 1.9 ng/mL (ref 0.3–4.0)

## 2011-01-14 LAB — HEMOGLOBIN A1C
Hgb A1c MFr Bld: 5.9 % — ABNORMAL HIGH (ref <5.7)
Mean Plasma Glucose: 123 mg/dL — ABNORMAL HIGH (ref <117)

## 2011-01-14 LAB — BRAIN NATRIURETIC PEPTIDE: Pro B Natriuretic peptide (BNP): 30 pg/mL (ref 0.0–100.0)

## 2011-01-15 ENCOUNTER — Emergency Department (HOSPITAL_COMMUNITY): Payer: PRIVATE HEALTH INSURANCE

## 2011-01-15 ENCOUNTER — Emergency Department (HOSPITAL_COMMUNITY)
Admission: EM | Admit: 2011-01-15 | Discharge: 2011-01-15 | Disposition: A | Discharge status: Home / Self Care | Payer: PRIVATE HEALTH INSURANCE | Attending: Emergency Medicine | Admitting: Emergency Medicine

## 2011-01-15 DIAGNOSIS — S2239XA Fracture of one rib, unspecified side, initial encounter for closed fracture: Secondary | ICD-10-CM | POA: Insufficient documentation

## 2011-01-15 DIAGNOSIS — S2249XA Multiple fractures of ribs, unspecified side, initial encounter for closed fracture: Secondary | ICD-10-CM | POA: Insufficient documentation

## 2011-01-15 DIAGNOSIS — R109 Unspecified abdominal pain: Secondary | ICD-10-CM | POA: Insufficient documentation

## 2011-01-15 DIAGNOSIS — F0390 Unspecified dementia without behavioral disturbance: Secondary | ICD-10-CM | POA: Insufficient documentation

## 2011-01-15 DIAGNOSIS — R0602 Shortness of breath: Secondary | ICD-10-CM | POA: Insufficient documentation

## 2011-01-15 DIAGNOSIS — Y92009 Unspecified place in unspecified non-institutional (private) residence as the place of occurrence of the external cause: Secondary | ICD-10-CM | POA: Insufficient documentation

## 2011-01-15 DIAGNOSIS — R296 Repeated falls: Secondary | ICD-10-CM | POA: Insufficient documentation

## 2011-01-15 DIAGNOSIS — E119 Type 2 diabetes mellitus without complications: Secondary | ICD-10-CM | POA: Insufficient documentation

## 2011-01-15 DIAGNOSIS — Z8546 Personal history of malignant neoplasm of prostate: Secondary | ICD-10-CM | POA: Insufficient documentation

## 2011-01-15 DIAGNOSIS — R079 Chest pain, unspecified: Secondary | ICD-10-CM | POA: Insufficient documentation

## 2011-01-15 LAB — COMPREHENSIVE METABOLIC PANEL
ALT: 20 U/L (ref 0–53)
Alkaline Phosphatase: 84 U/L (ref 39–117)
CO2: 24 mEq/L (ref 19–32)
Calcium: 9 mg/dL (ref 8.4–10.5)
GFR calc non Af Amer: 60 mL/min — ABNORMAL LOW (ref >60)
Glucose, Bld: 131 mg/dL — ABNORMAL HIGH (ref 70–99)
Sodium: 136 mEq/L (ref 135–145)

## 2011-01-15 LAB — DIFFERENTIAL
Basophils Absolute: 0.1 10*3/uL (ref 0.0–0.1)
Lymphocytes Relative: 11 % — ABNORMAL LOW (ref 12–46)
Lymphs Abs: 1.4 10*3/uL (ref 0.7–4.0)
Monocytes Absolute: 1 10*3/uL (ref 0.1–1.0)
Monocytes Relative: 8 % (ref 3–12)
Neutro Abs: 9.6 10*3/uL — ABNORMAL HIGH (ref 1.7–7.7)

## 2011-01-15 LAB — CBC
HCT: 44.2 % (ref 39.0–52.0)
Hemoglobin: 15.3 g/dL (ref 13.0–17.0)
MCHC: 34.6 g/dL (ref 30.0–36.0)
MCV: 93.8 fL (ref 78.0–100.0)

## 2011-01-15 MED ORDER — IOHEXOL 300 MG/ML  SOLN
100.0000 mL | Freq: Once | INTRAMUSCULAR | Status: AC | PRN
  Administered 2011-01-15: 100 mL via INTRAVENOUS

## 2011-01-16 LAB — BASIC METABOLIC PANEL
Calcium: 9.1 mg/dL (ref 8.4–10.5)
Chloride: 107 mEq/L (ref 96–112)
Creatinine, Ser: 0.94 mg/dL (ref 0.4–1.5)
GFR calc Af Amer: >60 mL/min (ref >60)

## 2011-01-16 LAB — DIFFERENTIAL
Lymphocytes Relative: 32 % (ref 12–46)
Lymphs Abs: 1.4 10*3/uL (ref 0.7–4.0)
Monocytes Relative: 9 % (ref 3–12)
Neutro Abs: 2.2 10*3/uL (ref 1.7–7.7)
Neutrophils Relative %: 53 % (ref 43–77)

## 2011-01-16 LAB — POCT CARDIAC MARKERS
CKMB, poc: 1.5 ng/mL (ref 1.0–8.0)
CKMB, poc: <1 ng/mL — ABNORMAL LOW (ref 1.0–8.0)
Myoglobin, poc: 60.7 ng/mL (ref 12–200)
Myoglobin, poc: 81.5 ng/mL (ref 12–200)
Troponin i, poc: <0.05 ng/mL (ref 0.00–0.09)

## 2011-01-16 LAB — CBC
RBC: 4.64 MIL/uL (ref 4.22–5.81)
WBC: 4.3 10*3/uL (ref 4.0–10.5)

## 2011-01-16 LAB — PROTIME-INR
INR: 0.99 (ref 0.00–1.49)
Prothrombin Time: 13 seconds (ref 11.6–15.2)

## 2011-01-17 ENCOUNTER — Telehealth (INDEPENDENT_AMBULATORY_CARE_PROVIDER_SITE_OTHER): Payer: Self-pay | Admitting: Internal Medicine

## 2011-01-22 ENCOUNTER — Encounter: Payer: Self-pay | Admitting: Internal Medicine

## 2011-01-22 ENCOUNTER — Encounter (INDEPENDENT_AMBULATORY_CARE_PROVIDER_SITE_OTHER): Payer: Self-pay | Admitting: Internal Medicine

## 2011-01-29 NOTE — Progress Notes (Signed)
  Phone Note From Other Clinic   Summary of Call: ED note with hx of minimally displace left posterior rib fracture after fall--please call and see if doing okay--occurred on 21st. Schedule a follow up next week if not doing well. Initial call taken by: Julieanne Manson MD,  January 17, 2011 6:03 PM  Follow-up for Phone Call        Dr. Delrae Alfred... called pt. and he says he's in a lot of pain.Marland KitchenMarland KitchenTaking Vicodin 325 about 4 times daily... Could not get a good hx but at first he said he did not have any more left and needed more but then I asked him how many pills he had left and he said his nurse had them... Not sure but I told him to call his nurse and ask her/him to give him his Vicodin if he's due.... He said he would... He has a f/u appt w/you on 01/22/11 @ 9:30... Hale Drone CMA  January 18, 2011 3:49 PM  Follow-up by: Julieanne Manson MD,  January 22, 2011 10:08 AM

## 2011-01-29 NOTE — Assessment & Plan Note (Signed)
Summary: f/u for sleep and hosp f/u    Vital Signs:  Patient profile:   77 year old male Weight:      124.56 pounds BMI:     22.14 Temp:     98.0 degrees F oral Pulse rate:   76 / minute Pulse rhythm:   regular Resp:     16 per minute BP sitting:   167 / 76  (right arm) Cuff size:   small  Vitals Entered By: Hale Drone CMA (January 22, 2011 10:02 AM) CC: Hosp f/u for broken ribs... Pt. is still not sleeping well.  Is Patient Diabetic? No Pain Assessment Patient in pain? yes       Does patient need assistance? Functional Status Self care Ambulation Normal, Impaired:Risk for fall   CC:  Hosp f/u for broken ribs... Pt. is still not sleeping well. Marland Kitchen  History of Present Illness: 1.  Fx  9th and likely 10th Left posterior ribs:  Pt. wants more pain meds, but nurse's aide, who accompanies him today, states he and a friend drank 37 beers the night he fell off his porch and fractured his ribs.  Pt. apparently drank 3 more beers the next day.  Using anincentive spirometer once to three times a day to avoid pneumonia.  No coughing.  No definite increase in dyspnea.  Was having more pain yesterday.  Nurse's aide is holding onto pain meds for him--Hydrocodone APAP 5 mg.  Has 6 tabs left.  Currently taking 2 tabs two times a day.   Has not received his Hovaround yet.  2.  Insomnia:  Pt. states Trazodone not working, but Animator, his aide, states the Trazodone was stopped as he was drinking and she was worried about him taking the med as well.  He has restarted the Trazodone since his fall, but also taking Hydrocodone at same time at bedtime.  Falls asleep, but awakens from the pain and cannot get back to sleep.  Current Medications (verified): 1)  Donepezil Hcl 10 Mg Tabs (Donepezil Hcl) .Marland Kitchen.. 1 Tab By Mouth Daily 2)  Omeprazole 20 Mg Cpdr (Omeprazole) .... Take One Tablet By Mouth Daily 3)  Thiamine Hcl 100 Mg Tabs (Thiamine Hcl) .... Take One Tablet By Mouth Daily 4)  Folic Acid 1 Mg Tabs  (Folic Acid) .... Take One Tablet By Mouth Daily 5)  Zofran 4 Mg Tabs (Ondansetron Hcl) .... Take One Tablet By Mouth Two Times A Day 6)  Trazodone Hcl 50 Mg Tabs (Trazodone Hcl) .... 3 Tabs By Mouth At Bedtime 7)  Hoveround Group 2 Power Wheelchair .... Date of Exam:  12/06/10 Dx:  Dyspnea, Gait Disturbance, Recurrent Falls Length of Need:  99 Months Weight:  127 Lbs 8)  Hydrocodone-Acetaminophen 5-325 Mg Tabs (Hydrocodone-Acetaminophen) .Marland Kitchen.. 1-2 Tabs By Mouth Every 4-6 Hours As Needed For Pain ([facility])  Allergies (verified): No Known Drug Allergies  Physical Exam  General:  NAD--speaks easily and at rest, breathing easily Lungs:  Clear with good air movement throughout. Heart:  Normal rate and regular rhythm. S1 and S2 normal without gallop, murmur, click, rub or other extra sounds. Extremities:  skin tear, about 3 cm in diameter on left forearm.  No surrounding erythema.  Old bandage removed and redressed with 2X2 and Kling.  Bacitracin applied to keep from sticking to dressing.   Impression & Recommendations:  Problem # 1:  ALCOHOL ABUSE (ICD-305.00)  Will have SW see today to discuss alcoholism and depression.  Orders: Psychology Referral (Psychology)  Problem # 2:  CLOSED FRACTURE OF TWO RIBS (ICD-807.02) 9th and 10th posterior left ribs. Pain control Continue incentive spirometry--brace with pillow.  Problem # 3:  INSOMNIA (ICD-780.52) Will readdress whether Trazodone works or not when returns in 3-4 months. Discussed how alcohol can raise havoc with sleep as well. Pt. denies alcohol abuse.  Complete Medication List: 1)  Donepezil Hcl 10 Mg Tabs (Donepezil hcl) .Marland Kitchen.. 1 tab by mouth daily 2)  Omeprazole 20 Mg Cpdr (Omeprazole) .... Take one tablet by mouth daily 3)  Thiamine Hcl 100 Mg Tabs (Thiamine hcl) .... Take one tablet by mouth daily 4)  Folic Acid 1 Mg Tabs (Folic acid) .... Take one tablet by mouth daily 5)  Zofran 4 Mg Tabs (Ondansetron hcl) .... Take one  tablet by mouth two times a day 6)  Trazodone Hcl 50 Mg Tabs (Trazodone hcl) .... 3 tabs by mouth at bedtime 7)  Hoveround Group 2 Power Wheelchair  .... Date of exam:  12/06/10 dx:  dyspnea, gait disturbance, recurrent falls length of need:  99 months weight:  127 lbs 8)  Hydrocodone-acetaminophen 5-325 Mg Tabs (Hydrocodone-acetaminophen) .Marland Kitchen.. 1-2 tabs by mouth every 4-6 hours as needed for pain ([facility])  Patient Instructions: 1)  No alcohol. 2)  Follow up with Dr. Delrae Alfred in 3 months -4 months 3)  Call if develops increased shortness of breath, cough, fever 4)  Pain pills to be controlled by Toni Amend Prescriptions: HYDROCODONE-ACETAMINOPHEN 5-325 MG TABS (HYDROCODONE-ACETAMINOPHEN) 1-2 tabs by mouth every 4-6 hours as needed for pain ([facility])  #20 x 0   Entered and Authorized by:   Julieanne Manson MD   Signed by:   Julieanne Manson MD on 01/22/2011   Method used:   Print then Give to Patient   RxID:   [id number]    Orders Added: 1)  Est. Patient Level IV [13244] 2)  Psychology Referral [Psychology]

## 2011-03-15 NOTE — Discharge Summary (Signed)
NAME:  [name], [name]                            ACCOUNT NO.:  [id number]   MEDICAL RECORD NO.:  [id number]                   PATIENT TYPE:  INP   LOCATION:  5004                                 FACILITY:  [facility]   PHYSICIAN:  Jackie Plum, M.D.             DATE OF BIRTH:  Jun 07, 1925   DATE OF ADMISSION:  09/23/2003  DATE OF DISCHARGE:                                 DISCHARGE SUMMARY   DISCHARGE DIAGNOSES:  1. Ventilator dependent hypoxic respiratory failure secondary to methicillin-     sensitive aspiration pneumonia, resolved.  2. Septic shock secondary to #1, resolved.  3. Toxic metabolic encephalopathy secondary to alcohol withdrawal and acute     illness, resolved.  4. Alcohol dependence.  5. Normocytic anemia and thrombocytosis secondary to acute illness,     outpatient follow-up recommended per primary care physician.  6. History of depression, anxiety and asthma.  7. Aspiration.   DISPOSITION:  The patient is going home with [facility] PT, home  health social worker follow-up, and PCS.   CONSULTATIONS:  1. Antonietta Breach, M.D., of psychiatry.  2. Danice Goltz, M.D., of critical care.   PROCEDURE:  Oral intubation on October 01, 2003.   CONDITION ON DISCHARGE:  Improved and stable.   DISCHARGE LABORATORY DATA:  The WBC count 7.7, hemoglobin 10.8, hematocrit  32.3, MCV 96.4, platelet count 652.  Sodium 136, potassium 3.5, chloride  103, CO2 26, BUN 18, glucose 99, creatinine 1.3, calcium 8.8.   DIET:  The patient will be changed from a regular diet to mechanical soft  diet with thin liquids.   DISCHARGE INSTRUCTIONS:  The patient instructed to stop drinking alcohol.  He is to follow up with [facility] as discussed and just prior to discharge, he is  to call his primary care physician, Dr. Lillia Mountain, for appointment to see him  in the office next week.  The patient is to call for appointment.   DISCHARGE MEDICATIONS:  1. Combivent MDI two puffs q.i.d.  2.  Iron sulfate 300 mg/5 mL t.i.d.  3. Aspirin 325 daily.  4. Multivitamins one tablet daily.  5. Vitamin 100 mg daily.  6. Protonix 40 mg daily.  7. Seroquel 15 mg b.i.d.  8. Ensure vanilla pudding 150 mL p.o. t.i.d.  9. Klonopin.   REASON FOR ADMISSION:  [name] presented with confusion.  He was found  down on the floor by neighbors.  He was disoriented.  In the ER, CBC,  electrolytes and ammonia as well as alcohol level of within normal limits.  CT of the head was done which was negative for any acute changes and Dr.  Hollice Espy, who was the admitting Hospitalist on call, was asked to  evaluate the patient for admission for confusion with probable dementia  versus acute delirium.  At the time of admission there were no signs of  infection or acute neurologic deficits.  Therefore, the patient was admitted  for further evaluation and management.   Please see the admission H&P dictated by Dr. Virginia Rochester dated September 23, 2003, for further insights into the patient's presenting signs and  symptoms and assessment and plan at that time.   HOSPITAL COURSE:  The patient was admitted to the Hospitalist Service.  Initial metabolic work-up including TSH and comprehensive metabolic profile  were unremarkable.  He was started on supportive care including IV fluid  supplementation.  EKG and CBC were unremarkable.  He became agitated while  he was in the hospital and had to be restrained.  He had a follow-up CT with  MRI of the head which was also negative.  He also was found to exhibit signs  of withdrawal and therefore, it was thought that his basic metabolic  presentation was related to his alcohol withdrawal.  He was started on  alcohol withdrawal protocol with Librium taper.  He also received  intermittent Haldol for control of his agitation.  Ativan was added for  synergistic effect with Haldol for control of his agitation with some  minimal effect.  On September 28, 2003,  there were concerns about aspiration  and the patient was started on dysphagia I with honey-thick liquids.  He was  to receive full supervision with p.o. intake and medications were crushed  and given in puree.  He had some electrolyte abnormalities with respect to  his potassium which was low and was appropriately corrected.  His diet was  changed to dysphagia II diet with thin liquids.  However, on October 01, 2003, the patient was noted to be febrile with temperatures in the range of  104 to 105 degrees Fahrenheit with increasing work of breathing.  and  decreasing O2 saturation.  He was suspected to have aspirated and he was  started on IV Zosyn.  Critical care was consulted immediately and he was  transferred to the ICU.  Critical care agreed with IV antibiotics and  thoughts regarding aspiration. He was started on aggressive oxygen  treatment, DVT prophylaxis and GI prophylaxis.  On the same day, the  patient's blood pressures dropped and he was started on pressor support.  His trachea aspirate culture grew gram positive cocci in clusters.  Therefore, he was started on vancomycin in addition to the Zosyn.   A family conference was held with the help of critical care on October 04, 2003, to discuss the patient's clinical status and it was agreed that the  patient be made no code blue since he did not want any prolonged life  sustaining measures as necessary.   On October 05, 2003, the patient's aspirate culture grew methicillin-  sensitive Staphylococcus aureus, therefore vancomycin was discontinued.  Blood cultures remain negative at the time of discharge.  The patient was  supported in the ICU on parenteral nutrition.  He was successfully extubated  on October 06, 2003, and was gradually weaned from face mask oxygen finally  to nasal cannula.  He was ultimately transferred to the floor where his total metabolic encephalopathy was supported with treatment with Klonopin  and  Seroquel with subsequent improvement at the time of discharge.  At the  time of discharge, the patient's sensorium had cleared to his baseline.  In  fact, he was deemed competent by psychiatry, Dr. Jeanie Sewer, yesterday in  consultation.   Social concerns:  The patient's brother told us that the patient lives in a  trailer and did not have  appropriate window coverage and wanted the patient  to be placed.  However, the patient has refused and insists on going back to  where he came from.  Psychiatrist says the patient is competent and  therefore we cannot hold the patient against his will in the hospital and he  is being discharged home today with appropriate outpatient follow-up by home  health PT, social worker follow-up and personal care services.  The patient  is advised to follow up with [facility] for his alcohol dependence and he has  agreed in principal to do so.  The patient will need to follow up with [facility]  as mentioned above.  Seroquel taper can take place along with alcohol  counseling.   I spent more than 45 minutes preparing this patient for discharge.  Carefully we reviewed his prolonged stay in the hospital to make sure that  no important information is left out in his outpatient planning.                                                Jackie Plum, M.D.    GO/MEDQ  D:  10/19/2003  T:  10/19/2003  Job:  [id number]   cc:   Danice Goltz, M.D. [facility]   Windle Guard, M.D.

## 2011-03-15 NOTE — Consult Note (Signed)
NAME:  [name] [name], BORN                            ACCOUNT NO.:  [id number]   MEDICAL RECORD NO.:  [id number]                   PATIENT TYPE:  INP   LOCATION:  0357                                 FACILITY:  [facility]   PHYSICIAN:  Iva Boop, M.D. [facility]           DATE OF BIRTH:  11-16-1924   DATE OF CONSULTATION:  12/07/2003  DATE OF DISCHARGE:                                   CONSULTATION   REQUESTING PHYSICIAN:  Hannah Beat, M.D.   REASON FOR CONSULTATION:  Heme-positive stool and anemia.   HISTORY:  This is a 77 year old white man that was admitted to the hospital  with confusion and was found to be demented by psychiatry.  He has a history  of chronic alcohol use and abuse with several recent admissions.  He was on  a ventilator in December of last year for pneumonia with MRSA.  He was  discharged four days prior to readmission on February 7th with confusion and  weakness.  He really cannot give much of a history otherwise because of his  dementia.  There is no known history GI bleeding reported.  No other  significant GI history reported as well.  He did have hemoglobin in the 10  range in December.  Now his hemoglobin is 9.7.  He has an MCV of 91.  His  stool was found to be Hemoccult positive this admission.  He denies any  abdominal pain.  He is just trying to sleep.  He does not give much history  other than that.   MEDICATIONS:  1. Protonix 40 mg daily.  2. He is placed on Lovenox subcu here.  3. Combivent.  4. Iron sulfate.  5. Folic acid.  6. Ativan.   ALLERGIES:  None known.   PAST MEDICAL AND SURGICAL HISTORY:  1. COPD.  2. Alcoholism.  3. MRSA pneumonia.  4. Acute renal failure in the past.  5. Prior rhabdomyolysis.   SOCIAL HISTORY:  He had lived alone.  He has a niece that is power of  attorney.  Has been a beer drinker, former smoker.   FAMILY HISTORY:  Is unclear and really difficult to obtain at this time.   REVIEW OF SYSTEMS:  Unobtainable other  than that mentioned above.   PHYSICAL EXAMINATION:  GENERAL:  A thin, chronically-ill, disheveled white  man, elderly.  He does know that he is in [place] and that it is 2005.  VITAL SIGNS:  Temperature 98, blood pressure 130/78, pulse 80s.  HEENT:  Eyes:  Anicteric.  NECK:  Supple, no mass.  LUNGS:  Clear.  HEART:  S1 & S2.  No rubs, murmurs, or gallops.  ABDOMEN:  Soft, nontender.  No organomegaly or mass.  EXTREMITIES:  Without clubbing, cyanosis, or edema.  He has some healing  abrasions in the hands and wrists and knees.  SKIN:  Somewhat pale without acute rash.  LYMPH NODES:  No neck or supraclavicular nodes palpated.  NEUROLOGICAL:  He is alert and oriented as described above.  He drifts off  to sleep.  Will not really open his eyes voluntarily at this point.   ADDITIONAL LABORATORY DATA:  Iron 23, TIBC 27, ferritin 129, iron saturation  10%.  Transaminases:  Alk phos, bilirubin normal, albumin 2.9.  Sed rate 15.  Platelets 396, white count 6.2.  A chest CT showed a hyperdense lesion in  the right hepatic lobe, question hemangioma.  An ultrasound was recommended.   ASSESSMENT:  Hemoccult positive stool and anemia of chronic disease.  The  elevated ferritin or at least the normal ferritin and the low TIBC all goes  along with chronic disease.  He does have a Hemoccult positive stool and a  stable anemia without evidence for hemorrhage.  He has significant dementia  and alcoholism. No evidence for cirrhosis.  There is a right hepatic lobe  lesion that could be a hemangioma versus cyst versus other.   RECOMMENDATIONS AND PLAN:  1. Ultrasound to evaluate the cyst in the liver.  2. Check coags to see if there is any synthetic dysfunction though I do not     suspect any significant liver disease.  3. Colonoscopy plus or minus upper endoscopy are not unreasonable but I do     not think we need to be in any hurry to do this.  He has significant     other problems that need  attention first and this could be done     electively if at all in this man.  I think it would be reasonable and we     will certainly need to discuss it with the niece once health care power     of attorney is obtained.  That is underway.                                               Iva Boop, M.D. [facility]    CEG/MEDQ  D:  12/07/2003  T:  12/07/2003  Job:  [id number]   cc:   Hettie Holstein, D.O.  Fax: [phone]   Windle Guard, M.D.  [address]  [place], [place] [address]  Fax: [phone]

## 2011-03-15 NOTE — Discharge Summary (Signed)
[name] [name], [name] [name]                  ACCOUNT NO.:  [id number]   MEDICAL RECORD NO.:  [id number]          PATIENT TYPE:  INP   LOCATION:  5530                         FACILITY:  [facility]   PHYSICIAN:  Jackie Plum, M.D.DATE OF BIRTH:  10/10/1925   DATE OF ADMISSION:  02/20/2005  DATE OF DISCHARGE:  02/24/2005                                 DISCHARGE SUMMARY   DISCHARGE DIAGNOSES:  1.  Acute delirium deemed secondary to Elavil up titration within one week.  2.  Rhabdomyolysis, mild, almost completely resolved with discharge total      CPK of 228.  3.  Mild hypokalemia with discharge potassium of 3.4, the patient has been      repleted with potassium 80 mEq total.  Outpatient followup.  4.  Mild cognitive deficit.  The patient deemed not to qualify for diagnosis      of dementia per psychiatry.  5.  History of alcohol abuse.   DISCHARGE LABS:  WBC 4.7, hemoglobin 10.7, hematocrit 30.4, MCV 94.3,  platelet count 224, sodium 139, potassium 3.4, chloride 109, CO2 23, glucose  102, BUN 8, creatinine 0.9, total bilirubin 0.4, alkaline phosphatase 42,  AST 16, ALT 14, total protein 4.6, albumin 2.4, calcium 8.1.   CONSULTANTS:  Dr. Jeanie Sewer of Psychiatry.   PROCEDURES:  None performed.   CONDITION ON DISCHARGE:  Improved, satisfactory.   MEDICATIONS ON DISCHARGE:  1.  __________ 237 daily.  2.  Folic acid 1 mg daily.  3.  Multivitamins 1 capsule daily.  4.  Thiamine 100 mg daily.  5.  Desyrel 50 mg q.h.s.   REASON FOR ADMISSION:  Delirium.  The patient presented with delirium.  He  had been found down in his apartment by a friend.  In the emergency room  according to H&P by Dr. Lendell Caprice, the patient had a preliminary exam was  unremarkable, he was said to be mumbling incoherently and moved all  extremities.  He had a BUN of 32 with a creatinine of 1.3, on presentation  and a total CPK of 599.  His alcohol level was less than 5 and a CT scan of  the head was negative.  X-rays did  not show any acute infiltrate.  He was  therefore admitted for further management of his delirium.   HOSPITAL COURSE:  The patient was admitted to the Hospitalist Service, was  given IV fluid supplementation for his mild dehydration.  Supportive care  with control of his delirium and agitation with sedative hypnotics was also  initiated.  He was given thiamine and folate and multivitamins.  RPR was  done which was nonreactive.  We also checked his B12 and folate levels which  were all inconsequential within normal limits.  The patient overnight  started improving remarkably mental status wise to his baseline at time of  discharge.  He was seen in consultation by Dr. Jeanie Sewer of psychiatry who  indicated that the patient has capacity, his TSH was also normal.  He was  seen in consultation by Dr. Jeanie Sewer who had diagnosis of anxiety disorder  NOS and unspecified  persistent mental disorder NOS.  He said that the  patient does not meet criteria for dementia, but has some persistent mild  cognitive deficit.  He also felt that the patients delirium which had  resolved was due to Elavil.  The patients Elavil dose had been titrated up  from 25 mg q.h.s. to 50 mg q.h.s. over one week.  He recommended that the  patient never be given Elavil again.  He also mentioned that the patient  demonstrates a return of the ability to make a consistent choice to  differentiate between options, risks, benefits and appreciate his needs and  reason well and therefore he does have the capacity for informed consent.  He recommended initiation of Desyrel and ADS referral on discharge.  Otherwise today the patient is doing well, he is alert and oriented x3, does  not have any focal neurological deficit.  He is appropriate and he is  appropriate for discharge today.  Physical examination indicated a BP of  127/62 at 20:20 hours last night with temperature of 97.8 degrees  Fahrenheit, pulse of 81, respirations 18,  02saturation 95% on room air.  Vital signs taken this morning __________.  His pupils were equal, round,  reactive to light.  Extraocular movements intact.  Lungs clear to  auscultation.  Cardiac regular, no gallops.  Abdomen soft and nontender.  Extremities no cyanosis, no edema.  He therefore is planned for discharge  today and to follow up with his Corderius Saraceni at the outpatient level and also to  follow up with [facility] for alcohol rehabilitation.  I tried to call the patients  family listed telephone number [phone] __________ the patient's daughter,  to notify her of discharge, but could not reach her.  I am going to ask  social Designer, industrial/product to assist [place] to find the family to be sure they are  aware of the discharge before the patient left the hospital, preferably to  be discharged home with his family.      GO/MEDQ  D:  02/24/2005  T:  02/24/2005  Job:  [id number]

## 2011-03-15 NOTE — Consult Note (Signed)
NAME:  [name] [name], [name] [name]                            ACCOUNT NO.:  [id number]   MEDICAL RECORD NO.:  [id number]                   PATIENT TYPE:  INP   LOCATION:  2314                                 FACILITY:  [facility]   PHYSICIAN:  Wilber Bihari. Caryn Section, M.D.                DATE OF BIRTH:  1925-01-01   DATE OF CONSULTATION:  DATE OF DISCHARGE:                                   CONSULTATION   REASON FOR CONSULTATION:  [name] [name] is a 6 white man admitted with BUN of  59, creatinine of 2.9, and bicarbonate of 13. Renal consult was requested  because of renal insufficiency and metabolic acidosis.  [name] [name] is unable  to give any history. Discharge summary, dated December 22-26, 2004, showed  he was admitted for MRSA pneumonia (respiratory failure on ventilator),  alcoholic withdrawal. When discharged he had a BUN of 18, creatinine 1.3. He  was brought to the emergency room today with worsening confusion. Renal  consult was requested. [name] [name] is unable to give history, so most of this  information comes from [place] and old records.   DISCHARGE MEDICATIONS:  Medications on discharge include Combivent two puffs  b.i.d., iron t.i.d., aspirin 325 mg daily, multivitamins one a day, Protonix  40 daily, Seroquel 15 mg b.i.d.   Family history, social history, and review of systems are unable to be  obtained. His primary care physician is listed as Dr. Windle Guard.   PHYSICAL EXAMINATION:  GENERAL: He is obtunded, arousable, tremulousness. He  does not answer questions. He is not cooperative.  VITAL SIGNS: Temperature 99.7, pulse 100, respirations 20, blood pressure  116/50.  CHEST: Rhonchi are heard.  HEART: No rub is heard.  ABDOMEN: No scars are present. No organs or masses are felt. Bowel sounds  are present. The abdomen is nontender.  GU: Foley catheter is present in the penis.  EXTREMITIES: No edema. There are no atheroemboli. There are abrasions on the  knees.  NEUROLOGIC: He is tremulousness  and agitated.   LABORATORY:  PH 7.92, PO2 95, PCO2 17.  Sodium 148, potassium 4.5, chloride  114, CO2 7, BUN 66, CR 2.9, calcium 7.6, and albumin not measured. CK 3200.  Hemoglobin 10.1, hematocrit 30.7, WBC 193,300, platelet count 322,000.  Lactate 1.3, salicylate less than 4. ETOH less than 5.  Osmolality 331.  Ethylene glycol and serum ketones are pending. Ketones are positive in the  urine. Urinalysis shows no oxalate crystals. There is blood, 3-6 white  cells, 20-30 red cells (Foley catheter in place).   IMPRESSION:  Metabolic acidosis with increased anion gap. Looks like  alcoholic acidosis with positive ketones in urine as well as prerenal  azotemia in this debilitated patient. Blood cultures are pending.  He is on  Zosyn at the current time.   RECOMMENDATIONS:  Serum ketones (acetoacetate and beta hydroxybutyrate).  Check ethylene glycol level.  D5 1/2-normal  saline with one amp of  bicarbonate. Watch closely for alcoholic withdrawal. Follow CK. Suspect  rhabdomyolysis. Decrease Zosyn to 2.25 gm q.8h. Check FE, TIBC, and  ferritin.                                               Richard F. Caryn Section, M.D.    RFF/MEDQ  D:  10/26/2003  T:  10/27/2003  Job:  [id number]

## 2011-03-15 NOTE — Discharge Summary (Signed)
NAME:  [name] [name], JACUINDE                            ACCOUNT NO.:  [id number]   MEDICAL RECORD NO.:  [id number]                   PATIENT TYPE:  INP   LOCATION:  0357                                 FACILITY:  [facility]   PHYSICIAN:  Ara D. Tammi Klippel, M.D.                DATE OF BIRTH:  Jun 12, 1925   DATE OF ADMISSION:  12/05/2003  DATE OF DISCHARGE:                                 DISCHARGE SUMMARY   Accepting facility is [facility].  Primary care  physician is Dr. [name].  Consulting physician is Dr. [name].   FINAL DIAGNOSES:  1. Rhabdomyolysis, resolved.  2. Heme-positive stools with stable hemoglobin and hematocrit.  3. Alcoholic dementia.  4. Chronic alcohol abuse.   FINAL PROCEDURES:  1. Two-view of the chest performed December 04, 2003, showing wedge-shaped     opacity in the peripheral aspect of the left lung with subtle opacity in     the left upper lung.  CT of the chest is recommended.  2. CT of the head performed December 05, 2003, showing stable CT appearance     of the brain with no acute findings apparent on exam.  3. CT of the chest performed December 05, 2003, showing bi-apical pleural     parenchymal scarring likely from old granulomatous disease, hiatal     hernia, mild bibasilar atelectasis, no significant adenopathy at this     time, hepatorenal cyst, hyperdense peripheral lesion in the right hepatic     lobe statistically likely to represent hemangioma or complex cyst.     Nonobstructive right renal calculus.  4. Knee x-rays performed December 05, 2003, showing lateral knee soft tissue     swelling without acute bony abnormality or effusion.  5. Hip x-rays performed December 05, 2003, showing no definite acute     abnormality by plain radiography, decreased bone density.  6. Abdominal ultrasound, final dictation pending at the time of discharge.   PERTINENT LABORATORY DATA AND OTHER TEST RESULTS:  Potassium on day of  discharge 4.2.  PT is 12.9, INR  1.0, PTT 32.  Creatinine kinase peak was  1752, dropping to a low of 243.  Sodium 134, chloride 107, carbon dioxide  26, glucose 110, BUN 4, creatinine 0.7, calcium 7.7.  Fecal occult blood  positive.  Iron 23, TIBC 227, percent saturation 10.  Blood cultures x2, no  growth to this date.  Ferritin is 124.  TSH is 0.777.  RPR is nonreactive.  Hemoglobin A1C is 5.3.  Vitamin B12 is 437.  Urine drug screen was negative.  Alcohol level was less than 5.   SUMMARY OF HOSPITAL COURSE BY SYSTEM:  The patient is a 77 year old white  male with past history as listed above, who was brought in again to the  [facility] emergency room for what appeared to be altered mental status.   Problem 1.  NEUROPSYCHIATRIC:  There were no signs and symptoms of  meningitis, encephalitis, or CVA.  The patient had no acute episodes of  psychosis or mania.  He was evaluated by Dr. Jeanie Sewer of psychiatry, who  once again states that the patient lacks any capacity to make decisions and  that he clearly does not appreciate his disabilities or risks or his health  care needs.  The patient initially did require soft restraints for the first  few days; however, the patient was not violent and did not act out and  required only an occasional p.r.n. lorazepam dosing to help with the  patient's symptoms.  He was maintained on thiamine and folate and was  monitored for alcohol withdrawal, of which there were no signs or symptoms.   Problem 2.  PULMONARY:  There were no in-hospital episodes of hypoxemia,  orthopnea, or PND.   Problem 3.  CARDIOVASCULAR:  There were no in-hospital episodes of chest  pain or cardiac arrhythmia.   Problem 4.  RENAL:  The patient had a slight degree of rhabdomyolysis, which  was secondary to him being found on the floor after being running away from  masked men with machine guns.  The patient was given IV fluids without the  need for bicarb.  His CKs dropped appropriately without any  alteration in  renal function.  Nephrotoxins were avoided, and medications were renally  dosed when appropriate.   Problem 5.  GASTROINTESTINAL:  The patient does have a normocytic anemia  with heme-positive stools.  Gastroenterology was consulted, who felt that  the patient would be better served having an outpatient EGD and colonoscopy  as well as further evaluation of his hepatic cyst by ultrasound.   Problem 6.  FLUIDS, ELECTROLYTES, AND NUTRITION:  The patient was volume-  resuscitated with normal saline and then this was med-locked once his CK  levels dropped accordingly.  Initially the patient was hypokalemic, and this  responded well to p.o. supplementation.  The patient was seen by nutrition,  who offered vanilla Ensure with fiber t.i.d. between meals.   Problem 7.  INFECTIOUS DISEASE:  No active issues.   Problem 8.  ENDOCRINE:  No active issues.   Problem 9.  HEMATOLOGY/ONCOLOGY:  No active issues.   Problem 10.  PROPHYLAXIS:  The patient was full p.o. for GI prophylaxis and  was placed on subcu heparin for DVT prophylaxis.   DISPOSITION:  The patient is being discharged in stable condition to  [facility].  He is a full code.   DISCHARGE MEDICATIONS:  1. Folic acid 1 mg p.o. daily.  2. Multivitamin one tablet p.o. daily.  3. Iron sulfate 325 mg p.o. daily.  4. Lorazepam 0.5 mg p.o. q.8h. p.r.n. anxiety.   DISCHARGE INSTRUCTIONS:  1. He is to be admitted at [facility].  2. He is to have follow-up with Dr. [name] of [facility], the number there     is area code [phone], for an EGD and colonoscopy in approximately     two to four weeks.  The patient is to have his health care power of     attorney available in order to sign consent forms.  3. He is to follow up with Dr. [name] at his next regularly scheduled     appointment.  4. He is to abstain from alcohol ingestion. 5. He is to return to the emergency room if he feels worse.  Ara D. Tammi Klippel, M.D.    ADM/MEDQ  D:  12/08/2003  T:  12/08/2003  Job:  [id number]   cc:   [facility]   Windle Guard, M.D.  [address]  [place], [place] [address]  Fax: [phone]   Iva Boop, M.D. [facility]

## 2011-03-15 NOTE — H&P (Signed)
NAME:  [name] [name], [name] [name]                            ACCOUNT NO.:  [id number]   MEDICAL RECORD NO.:  [id number]                   PATIENT TYPE:  INP   LOCATION:  5008                                 FACILITY:  [facility]   PHYSICIAN:  Hollice Espy, M.D.            DATE OF BIRTH:  [birth year]   DATE OF ADMISSION:  09/23/2003  DATE OF DISCHARGE:                                HISTORY & PHYSICAL   PRIMARY CARE DOCTOR:  Windle Guard, M.D., family practice.   HISTORY OF PRESENT ILLNESS:  This is a 77 year old white male with a past  medical history of question whether or not this is confirmed of asthma,  anxiety, depression, who was found down on the floor by neighbors at his  home.  The patient was found with confusion.  This report is given by the  EMS report.  Unable to get a history from the patient as he mumbles his  speech and appears to be a poor historian.  He was brought to the ED by EMS.  We do not know of his baseline.  He has family in [place] which could not  be reached.  His home phone which is listed on his charge sheet is  disconnected.  PCP is at [facility].  The patient mumbles  his speech.  He denies any medical problems.  He says he feels fine.  He  denies that he takes any medication, but according to the ER report he is on  amitriptyline and Viagra and when I asked about these, he does say, yes, I  do take these,  but he did not know the dose or the last time he took his  medicines, especially his amitriptyline.  He tells me that he does have a  wife who is deceased.  He has family who does live here.  A workup including  ABG, CBC, lytes, ammonia, alcohol level, and CT of the head were all normal.  CT of the head was negative for any acute changes.  I am unable to get a  further review of history from the patient secondary to his confusion and  mumbled speech.  It does not appear to be slurred.   PAST MEDICAL HISTORY:  1. Asthma.  2. Anxiety.  3.  Depression.  4. Some form of cancer.   MEDICATIONS:  1. Amitriptyline  2. Viagra, doses unknown.   ALLERGIES:  NO KNOWN DRUG ALLERGIES.   SOCIAL HISTORY:  He denies any tobacco or drug use.  He does state that he  drinks alcohol about two beers a day.   FAMILY HISTORY:  Noncontributory.   PHYSICAL EXAMINATION:  VITAL SIGNS:  On admission are 123/102, pulse 107,  respirations 20, temp 99.5.  O2 sat 97% on room air.  GENERAL:  He is alert and oriented x 2.  He states that he thinks that the  year is 2002.  Could not tell me the month.  He does know that he is in  [facility], and he knows his own name.  HEENT:  Normocephalic, atraumatic.  He has no carotid bruits.  His mucous  membranes are slightly dry.  HEART:  Regular rate and rhythm, S1, S2.  LUNGS:  Clear to auscultation bilaterally.  ABDOMEN:  Soft, nontender, nondistended.  Positive bowel sounds.  EXTREMITIES:  No clubbing, cyanosis or edema.  NEUROLOGIC:  Although I had a hard time, I was able to do some sort of focal  neurological exam and could not find any deficits.  He appears to have no  focal weakness of his extremities, upper or lower, flexion, extension or  grip.  Negative Babinski.  No evidence of any tremor.  The patient does have  a tendency to wander.   LAB WORK:  The patient's lab work shows he has a pH of 7.45, pCO2 of 30.6,  bicarb 21.  Sodium 140, potassium 3.9, chloride 108, bicarb 21, BUN 30,  creatinine not done.  Ammonia 28 which is normal.  INR 1.1 which is normal.  Lipase 14 which is normal.  Alcohol level less than 5 which is normal and a  CBC with a white count of 8.7, H&H 12.6, 36.2,  MCV of 97 which is slightly  elevated, platelet count of 264, 85% shift.   ASSESSMENT/PLAN:  This is a 77 year old white male past medical history of  asthma, anxiety and depression who was found down on the floor with  confusion.  I do not know if this confusion is chronic dementia or an acute  delirium.   He appears to have not signs of infection, no neurological  findings.  Noted his high blood pressure which initially I do not know if  that was accurate, on repeat appears to be normal.  This may be secondary to  hypertension.  This may be secondary to alcohol abuse, more likely chronic  and this may be a form of Wernicke encephalopathy or Korsakoff, although  against that would be the fact that he has no ocular findings, and he has no  ataxia.  So, we will go ahead and check a thyroid-stimulating hormone,  comprehensive metabolic panel, repeat a complete blood count.  We will give  him intravenous fluids.  With his BUN, he may have a mild dehydration.  I  will ask psychiatry to assess the patient.  We will try to tack down primary  care physician and family to discuss his baseline status.  He may need  placement.                                                Hollice Espy, M.D.    SKK/MEDQ  D:  09/23/2003  T:  09/23/2003  Job:  [id number]

## 2011-03-15 NOTE — H&P (Signed)
NAME:  [name] [name], [name] [name] NO.:  [id number]   MEDICAL RECORD NO.:  [id number]                   PATIENT TYPE:  EMS   LOCATION:  MAJO                                 FACILITY:  [facility]   PHYSICIAN:  Sherin Quarry, MD                   DATE OF BIRTH:  1925-08-13   DATE OF ADMISSION:  10/26/2003  DATE OF DISCHARGE:                                HISTORY & PHYSICAL   [name] [name] is a 77 year old man who was initially admitted on September 23, 2003, when he presented to the emergency room with profound mental  confusion. Subsequently, it became clear that the patient was experiencing  alcohol withdrawal syndrome. During his early evaluation in the hospital he  underwent both a CT scan of the brain and an MRI scan which was negative.  The patient was placed on a lithium alcohol-withdrawal protocol and was also  administered intermittent Haldol for control of his agitation. The patient  became increasingly sedated and began to experience some difficulty with  dysphagia. On October 01, 2003, he became febrile and developed respiratory  distress and was felt to have aspirated. He was moved to the intensive care  unit and was intubated for acute respiratory management. Subsequently his  sputum cultures grew a methicillin-sensitive Staphylococcus aureus. The  patient was subsequently treated as a floor patient after he was extubated  and received Klonopin and Seroquel to control his agitation. Prior to the  patient's discharge on October 19, 2003, all family members agreed that he  was not capable of carrying for himself and that he needed to be placed in a  nursing home.  However, [name] [name] categorically refused and was felt to be  of sound mind and therefore was discharged home.  What has happened since  then is completely unclear. He is brought back to the emergency room tonight  in a state of mental confusion and agitation, and was dropped off here by  his niece  who is not available to answer any questions.  The patient is able  to answer only very simple questions.  As is usually the case, he states  that he has drank only one or two beers. He says that he is not experiencing  a headache. He denies chest pain or abdominal pain. Other than this I cannot  obtain any information from him. When he first came into the emergency room,  he was relatively hypotensive and was also hypothermic. These parameters  have improved with conservative treatment. He will be admitted for  evaluation of these symptoms.  It would appear that he has clearly  demonstrated that he is unable to care for himself at home.   PAST MEDICAL HISTORY:  Allergies--no known drug allergies.   CURRENT MEDICATIONS:  1. Combivent two puffs q.i.d.  2. Iron sulfate 300 mg t.i.d.  3. Aspirin 325 mg daily.  4. Multivitamins one daily.  5. Thiamine 100 mg daily.  6. Protonix 40 mg daily.  7. Seroquel the dose of which is probably 25 mg b.i.d.  8. Ensure Vanilla Pudding.  9. Klonopin the dose of which is uncertain.   ILLNESSES:  As described above.   OPERATIONS:  The patient had reported to Dr. Rito Ehrlich that he had had some  type of cancer operation in the past. The details of this are unclear.   FAMILY HISTORY:  Cannot be obtained at this time.   SOCIAL HISTORY:  The patient lives in a trailer near other family members.  He has a long history of alcohol abuse. Other than this I cannot provide any  further information.   REVIEW OF SYSTEMS:  Essentially cannot be obtained.   PHYSICAL EXAMINATION:  GENERAL: The patient is an agitated man who does  follow simple commands and appears to answer simple questions. His speech is  rather garbled, largely because of his edentulous state.  HEENT:  Remarkable for some generalized muscle rigidity.  CHEST: The chest is quite clear to auscultation.  CARDIOVASCULAR: Normal S1 and S2 without murmurs, rubs, or gallops.  ABDOMEN: Benign. There  are normal bowel sounds.  There are no masses or  tenderness. No guarding or rebound.  NEUROLOGIC/EXTREMITIES: Remarkable for the patient's confusion. He appears  to understand that he is in the hospital. He is not oriented to place.  His  speech is too garbled to really ask him any additional questions. He has  increased muscle tone and a mild tremor.   IMPRESSION:  1. Acute mental confusion. This could be an alcohol withdrawal syndrome.  2. History of respiratory failure attributed to aspiration pneumonia.  3. History of alcohol withdrawal and delirium tremens.  4. Chronic alcohol abuse.  5. History of depression.  6. Chronic obstructive pulmonary disease, asthmatic bronchitis.   Will admit the patient for observation. Will give him intravenous fluids and  enact a modified Ativan alcohol withdrawal protocol. Will also see if we can  give him some Seroquel on a regular basis.  As a precaution, we will follow  his oxygenation and I am also going to empirically put him on broad-spectrum  antibiotics in case this could be an occult sepsis syndrome.  Clearly, this  gentleman needs placement at this point.                                                Sherin Quarry, MD    SY/MEDQ  D:  10/26/2003  T:  10/26/2003  Job:  [id number]   cc:   Windle Guard, M.D.  [address]  [place], [place] [address]  Fax: [phone]

## 2011-03-15 NOTE — H&P (Signed)
NAMECOLTEN, [name]                  ACCOUNT NO.:  [id number]   MEDICAL RECORD NO.:  [id number]          PATIENT TYPE:  INP   LOCATION:  1827                         FACILITY:  [facility]   PHYSICIAN:  Corinna L. Lendell Caprice, MDDATE OF BIRTH:  1925-01-26   DATE OF ADMISSION:  02/20/2005  DATE OF DISCHARGE:                                HISTORY & PHYSICAL   CHIEF COMPLAINT:  Fall.   HISTORY OF PRESENT ILLNESS:  [name] is a 77 year old demented white male  who was reportedly found down in his apartment by a friend.  The patient can  provide no history whatsoever.  All records are per EMS notes and old chart.  According to a bystander, the patient was not acting himself.  According to  old chart, he has a history of dementia from alcohol.   PAST MEDICAL HISTORY:  1.  As above.  2.  He has also had rhabdomyolysis.  3.  Heme positive stools.  4.  Alcohol abuse.   MEDICATIONS:  Unknown.   SOCIAL HISTORY:  Unknown.   FAMILY HISTORY:  Unknown.   REVIEW OF SYSTEMS:  Unknown.   PHYSICAL EXAMINATION:  VITAL SIGNS:  Temperature 98.2; blood pressure  136/94; pulse 122; respiratory rate 26; oxygen saturation 98% on room air.  GENERAL:  The patient is an unkempt thin white male.  HEENT:  Normocephalic, atraumatic.  Pupils equal, round, and reactive to  light.  He is edentulous.  Moist mucous membranes.  NECK:  Supple.  No C-spine tenderness or step-offs.  LUNGS:  Clear to auscultation bilaterally, without wheezes, rhonchi, or  rales.  CARDIOVASCULAR:  Regular rate and rhythm, without murmurs, gallops, or rubs.  ABDOMEN:  Soft, nontender, nondistended.  GU/RECTAL:  Deferred.  EXTREMITIES:  No clubbing, cyanosis, or edema.  SKIN:  No rash.  PSYCHIATRIC:  The patient is initially lying asleep, but when awakened  becomes somewhat agitated.  NEUROLOGIC:  The patient is initially asleep but arousable.  He is mumbling  incoherently and appears to be moving all extremities.   LABORATORIES:   White blood cell count 10.8, hemoglobin 13, hematocrit 38,  platelet count 277.  Complete metabolic panel is significant for BUN 32,  creatinine 1.3, otherwise fairly unremarkable.  Total CPK 599.  Blood  alcohol less than 5.  Lipase 14.  CT of the head shows nothing acute.  Chest  x-ray:  Nothing acute.   ASSESSMENT AND PLAN:  1.  Weakness/fall, with acute delirium.  Consider delirium tremens.  The      patient will get p.r.n. Ativan.  I will give thiamine and folate.  I      will get a PT/OT consult.  He may need placement, as I believe he lives      alone.  2.  History of alcohol abuse.  3.  Mild rhabdomyolysis.  This will be followed.  4.  In addition to above, I will check coagulation panel, TSH, B-12, folate,      and RPR.      CLS/MEDQ  D:  02/21/2005  T:  02/21/2005  Job:  [id number] 

## 2011-03-15 NOTE — Discharge Summary (Signed)
NAME:  [name] [name], [name] [name]                            ACCOUNT NO.:  [id number]   MEDICAL RECORD NO.:  [id number]                   PATIENT TYPE:  INP   LOCATION:  3734                                 FACILITY:  [facility]   PHYSICIAN:  Hollice Espy, M.D.            DATE OF BIRTH:  1925-07-22   DATE OF ADMISSION:  10/26/2003  DATE OF DISCHARGE:  11/01/2003                                 DISCHARGE SUMMARY   PRIMARY CARE PHYSICIAN:  Windle Guard, M.D.   DISCHARGE DIAGNOSES:  1. Metabolic acidosis felt to be secondary to alcoholic ketosis.  2. Alcohol abuse.  3. Alcoholic dementia.  4. Hypothermia.  5. Acute renal failure.  6. Dehydration contributing to renal failure.  7. Rhabdomyolysis.   CONSULTATIONS:  Antonietta Breach, M.D., psychiatry, and Wilber Bihari. Caryn Section, M.D.,  nephrology.   DISCHARGE MEDICATIONS:  1. Combivent two puffs q.i.d.  2. Iron sulfate 300 mg t.i.d.  3. Multivitamin one p.o. daily.  4. Thiamine 100 mg p.o. daily.  5. Aspirin 325 mg p.o. daily.  6. Protonix 40 mg p.o. daily.  7. Seroquel 25 mg p.o. b.i.d.  8. Ativan 0.5 mg p.o. q.6h p.r.n. agitation.   DIET:  Mechanical soft diet with thin liquids.   HOSPITAL COURSE:  This is a 77 year old white male who had been previously  admitted to [facility] with a history of alcohol abuse, acute  delirium, and secondary aspiration pneumonia, who was discharged on October 19, 2003, after the patient was thought to be within sound mind and refused  placement.  He was advised not to drink and he was told by his family that  they would continue to check on him while he was discharged.  The patient  spent approximately six to seven days at home and it was unknown what  happened on the afternoon of October 25, 2003.  The patient was brought to  the ED, it was not known who.  There was no history involved and the patient  was left in the ER.  He was found to be hypothermic with an elevated anion  gap of 26 in addition.   His BUN and creatinine were elevated at 61 and 3.  His CPK level was 3200 consistent with rhabdomyolysis indicating that the  patient probably had been down for an unknown period of time.  It was likely  suspected that he had consumed alcohol of some quantity from some source,  but it was not known how much.  His liver enzymes were slightly elevated at  89 and 45 for AST and ALT, respectively.  Cardiac enzymes including a  troponin I were negative.  The patient's lactic acid level and salicylate  levels were checked and these were normal.  Dr. Caryn Section from nephrology was  consulted for the patient's acidosis and felt that this could be ketosis  versus Ethylene Glycol acidosis.  Ethylene Glycol levels were sent  and this  was stable.   HOSPITAL COURSE:  Problem 1.  The patient's metabolic acidosis.  He was sent  down to the stepdown unit and he was put on IV fluids and IV bicarb.  He was  made NPO.  The patient's gap shortly resolved in the subsequent days and by  October 27, 2003, his anion gap was only 8.  Alcoholic ketosis was  suspected.   Problem 2.  Rhabdomyolysis.  Initially this was felt to be secondary to the  patient being down for an unknown period of time.  He was given IV fluids  and his CPK levels continued to improve.  By October 30, 2003, his CPK level  was 119.  He continued to receive more IV fluids.   Problem 3.  History of alcohol abuse.  Likely suspect that he abused alcohol  on returning home.  Antonietta Breach, M.D. assessed the patient once he was  in the stepdown unit after his acidosis had resolved.  The patient was able  to converse with him, but Dr. Jeanie Sewer felt that the patient could not  appreciate or identify his medical problems and felt his judgment was  impaired.  He felt that the patient had a chronic dementia likely secondary  to alcohol and does not have the capacity for informed consent.  He was  recommended to continue him on his Seroquel with p.r.n.  Ativan for  agitation.  The family conference was discussed and the patient's family  including his daughter and his brother both agreed that the patient needs to  be placed, although, both declined on signing any type of forms to admit the  patient to a nursing home.  Physical therapy assessed the patient and they  recommended further that the patient does need nursing home placement.  The  patient's daughter agreed that the patient's companion, a Mrs. Edwards, who  occasionally visits the patient, was given power to sign the patient to a  nursing facility, [facility] was available and the patient  is to be transferred today on November 01, 2003.  In terms of the patient's  chronic alcohol dementia, this is a chronic problem and is the cause of his  admission.  In terms of the patient's acute renal failure.  He was given IV  fluids and as his CPK levels went down, his renal function improved.  By  October 29, 2003, his BUN and creatinine were 9 and 0.9.  Electrolytes were  replaced as he continued to diurese.  The patient was felt to be medically  stable for transfer to the nursing home facility.   DISPOSITION:  Improved, although, his longterm problems with his dementia  will continue, although, with proper care, he will be in much better  condition than the social situation he presented with when he went home on  his previous discharge.   ACTIVITY:  As predicted by the nursing facility.                                                Hollice Espy, M.D.    SKK/MEDQ  D:  11/01/2003  T:  11/01/2003  Job:  [id number]   cc:   Windle Guard, M.D.  [address]  [place], [place] [address]  Fax: [phone]

## 2011-03-15 NOTE — H&P (Signed)
NAME:  [name] [name], [name] [name]                            ACCOUNT NO.:  [id number]   MEDICAL RECORD NO.:  [id number]                   PATIENT TYPE:  INP   LOCATION:  5008                                 FACILITY:  [facility]   PHYSICIAN:  Hollice Espy, M.D.            DATE OF BIRTH:  12-19-1924   DATE OF ADMISSION:  09/23/2003  DATE OF DISCHARGE:                                HISTORY & PHYSICAL   ADDENDUM:  The patient arrived to the floor and his niece came by who knows  the patient well. I spoke with her over the phone and was able to obtain a  better history. Reportedly, the patient is usually very alert and oriented.  His speech is normal and very clear. He is a Community education officer. He was previously  well two days ago and then started yesterday became more like this very  confused and happened to fall a number of times which is what he has  actually been brought in for as that he was found again not down by the  neighbors, but he had just fallen and was found by the neighbors. According  to the patient his only medical history is that he has a sleep disorder that  he has been on amitriptyline for a while, but the dose was increased lately.  In fact I do not know the dosage of the medication that he was taking, but  believe it to be 200 mg prior to this and then recently increased the dose.  In addition, the patient drinks approximately two 40-ounce beers a night, if  not more.  There is no history of drug use and does not know when his last  drink was. After speaking with the niece and discussing this with the nurse,  the patient has been reportedly trying to get out of bed repeatedly and he  may be having signs of alcohol withdrawal/Wernicke's encephalopathy. Again,  he has no ocular findings though. Will put the patient on a Librium  protocol. In addition, I will be checking an MRI of the head just to rule  out any further problems, to rule out this is not a CVA.  However, given the  acute  nature of his encephalopathy this may indeed be a withdrawal problem.                                                Hollice Espy, M.D.    SKK/MEDQ  D:  09/23/2003  T:  09/24/2003  Job:  [id number]
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
## 2011-07-05 ENCOUNTER — Encounter (INDEPENDENT_AMBULATORY_CARE_PROVIDER_SITE_OTHER): Payer: Self-pay | Admitting: Surgery

## 2011-07-10 ENCOUNTER — Encounter (INDEPENDENT_AMBULATORY_CARE_PROVIDER_SITE_OTHER): Payer: Self-pay | Admitting: Surgery

## 2011-07-10 ENCOUNTER — Ambulatory Visit (INDEPENDENT_AMBULATORY_CARE_PROVIDER_SITE_OTHER): Payer: Medicare Other | Admitting: Surgery

## 2011-07-10 VITALS — BP 130/74 | HR 80 | Temp 96.3°F | Ht 64.5 in | Wt 123.1 lb

## 2011-07-10 DIAGNOSIS — K4090 Unilateral inguinal hernia, without obstruction or gangrene, not specified as recurrent: Secondary | ICD-10-CM

## 2011-07-10 NOTE — Progress Notes (Signed)
Chief Complaint  Patient presents with  . Inguinal Hernia    HPI [name] Rice is a 77 y.o. male.   HPI This [facility] is [facility] by [facility] for evaluation of a large right inguinal hernia. The patient states that he first noticed this hernia about 7 months ago. The patient is a relatively poor historian due to Alzheimer's dementia. He is accompanied by his nurse who is also his legal guardian and power of attorney. The nurse states that this bulge has gotten much larger in the last few months. It is becoming uncomfortable. He has frequent diarrhea but she denies any obstructive symptoms. Past Medical History  Diagnosis Date  . Arthritis   . Cancer     prostate   . Stroke   . 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 Nasal congestion   . Hearing loss   . Difficulty urinating   . Weakness   . Dementia     Past surgical history - none  History reviewed. No pertinent family history.  Social History History  Substance Use Topics  . Smoking status: Former Games developer  . Smokeless tobacco: Not on file  . Alcohol Use: No    No Known Allergies  Current Outpatient Prescriptions  Medication Sig Dispense Refill  . donepezil (ARICEPT) 10 MG tablet Take 10 mg by mouth at bedtime as needed.        . eszopiclone (LUNESTA) 1 MG TABS Take 1 mg by mouth at bedtime. Take immediately before bedtime       . trospium (SANCTURA) 20 MG tablet Take 20 mg by mouth 2 (two) times daily.          Review of Systems Review of Systems  Blood pressure 130/74, pulse 80, temperature 96.3 F (35.7 C), height 5' 4.5" (1.638 m), weight 123 lb 2 oz (55.849 kg). ROS positive for hearing loss, nasal congestion, Alzheimer's dementia with confusion, weakness, difficulty urinating, recent hospitalization for chest pain. He was hospitalized last November for atypical chest pain. He had a negative stress Myoview and cardiology felt that there was no sign of any cardiac ischemia. Physical Exam Physical  Exam WDWN in NAD HEENT:  EOMI, sclera anicteric Neck:  No masses, no thyromegaly Lungs:  CTA bilaterally; normal respiratory effort CV:  Regular rate and rhythm; no murmurs Abd:  +bowel sounds, soft, non-tender, no masses GU:  Bilateral descended testicles; large right inguinal hernia - reducible; no sign of left inguinal hernia Ext:  Well-perfused; no edema Skin:  Warm, dry; no sign of jaundice  Data Reviewed none  Assessment    Large reducible right inguinal hernia Alzheimer's dementia    Plan    Right inguinal hernia repair with mesh.   I described the procedure in detail.  The patient was given educational material. We discussed the risks and benefits including but not limited to bleeding, infection, chronic inguinal pain, nerve entrapment, hernia recurrence, mesh complications, hematoma formation, urinary retention, injury to the testicles , numbness in the groin, blood clots, injury to the surrounding structures, and anesthesia risk. We also discussed the typical post operative recovery course, including no heavy lifting for 6 weeks.        Illya Gienger K. 07/10/2011, 3:20 PM

## 2011-07-19 LAB — CBC
MCHC: 34.4
MCV: 97.4
Platelets: 264
RDW: 13.6

## 2011-07-19 LAB — I-STAT 8, (EC8 V) (CONVERTED LAB)
BUN: 7
Bicarbonate: 23.8
Hemoglobin: 15.3
Operator id: 133351
Potassium: 4.4
Sodium: 136

## 2011-07-19 LAB — POCT CARDIAC MARKERS
Operator id: 133351
Troponin i, poc: <0.05

## 2011-07-19 LAB — D-DIMER, QUANTITATIVE: D-Dimer, Quant: 0.31

## 2011-08-21 ENCOUNTER — Other Ambulatory Visit (INDEPENDENT_AMBULATORY_CARE_PROVIDER_SITE_OTHER): Payer: Self-pay | Admitting: Surgery

## 2011-08-21 ENCOUNTER — Encounter (HOSPITAL_COMMUNITY): Payer: PRIVATE HEALTH INSURANCE

## 2011-08-21 LAB — CBC
Hemoglobin: 13.6 g/dL (ref 13.0–17.0)
MCHC: 33.2 g/dL (ref 30.0–36.0)
RDW: 12.8 % (ref 11.5–15.5)
WBC: 4.4 10*3/uL (ref 4.0–10.5)

## 2011-08-21 LAB — COMPREHENSIVE METABOLIC PANEL
ALT: 9 U/L (ref 0–53)
Albumin: 3.6 g/dL (ref 3.5–5.2)
Alkaline Phosphatase: 61 U/L (ref 39–117)
Chloride: 104 mEq/L (ref 96–112)
Glucose, Bld: 95 mg/dL (ref 70–99)
Potassium: 5 mEq/L (ref 3.5–5.1)
Sodium: 139 mEq/L (ref 135–145)
Total Bilirubin: 0.3 mg/dL (ref 0.3–1.2)
Total Protein: 6.8 g/dL (ref 6.0–8.3)

## 2011-08-27 ENCOUNTER — Ambulatory Visit (HOSPITAL_COMMUNITY)
Admission: RE | Admit: 2011-08-27 | Discharge: 2011-08-29 | Disposition: A | Discharge status: Home / Self Care | Payer: PRIVATE HEALTH INSURANCE | Source: Ambulatory Visit | Attending: Surgery | Admitting: Surgery

## 2011-08-27 DIAGNOSIS — K4090 Unilateral inguinal hernia, without obstruction or gangrene, not specified as recurrent: Secondary | ICD-10-CM

## 2011-08-27 DIAGNOSIS — F0280 Dementia in other diseases classified elsewhere without behavioral disturbance: Secondary | ICD-10-CM | POA: Insufficient documentation

## 2011-08-27 DIAGNOSIS — Z01810 Encounter for preprocedural cardiovascular examination: Secondary | ICD-10-CM | POA: Insufficient documentation

## 2011-08-27 DIAGNOSIS — R197 Diarrhea, unspecified: Secondary | ICD-10-CM | POA: Insufficient documentation

## 2011-08-27 DIAGNOSIS — M129 Arthropathy, unspecified: Secondary | ICD-10-CM | POA: Insufficient documentation

## 2011-08-27 DIAGNOSIS — G309 Alzheimer's disease, unspecified: Secondary | ICD-10-CM | POA: Insufficient documentation

## 2011-08-27 DIAGNOSIS — H9190 Unspecified hearing loss, unspecified ear: Secondary | ICD-10-CM | POA: Insufficient documentation

## 2011-08-27 DIAGNOSIS — R339 Retention of urine, unspecified: Secondary | ICD-10-CM | POA: Insufficient documentation

## 2011-08-27 DIAGNOSIS — Z8546 Personal history of malignant neoplasm of prostate: Secondary | ICD-10-CM | POA: Insufficient documentation

## 2011-08-27 DIAGNOSIS — Z8673 Personal history of transient ischemic attack (TIA), and cerebral infarction without residual deficits: Secondary | ICD-10-CM | POA: Insufficient documentation

## 2011-08-27 LAB — GLUCOSE, CAPILLARY
Glucose-Capillary: 103 mg/dL — ABNORMAL HIGH (ref 70–99)
Glucose-Capillary: 135 mg/dL — ABNORMAL HIGH (ref 70–99)

## 2011-08-27 NOTE — Op Note (Signed)
  NAMESAAHIL, HERBSTER NO.:  [id number]  MEDICAL RECORD NO.:  [id number]  LOCATION:  1531                         FACILITY:  [facility]  PHYSICIAN:  Wilmon Arms. Corliss Skains, M.D. DATE OF BIRTH:  08/28/1925  DATE OF PROCEDURE:  08/27/2011 DATE OF DISCHARGE:                              OPERATIVE REPORT   PREOPERATIVE DIAGNOSIS:  Right inguinal hernia.  POSTOPERATIVE DIAGNOSIS:  Right inguinal hernia.  PROCEDURE:  Right inguinal hernia repair with mesh.  SURGEON:  Wilmon Arms. Anant Agard, M.D..  ANESTHESIA:  General.  INDICATIONS:  This is an 77 year old male who suffers from chronic dementia, who presents with a bulge in his right groin that has been present for several months.  This has become fairly symptomatic.  He presents now for repair.  DESCRIPTION OF PROCEDURE:  The patient was brought to the operating room, placed in a supine position on operating table.  After adequate level of general anesthesia was obtained, the patient's right groin was shaved, prepped with ChloraPrep and draped in sterile fashion.  Time-out was taken to ensure the proper patient and proper procedure.  We made an oblique incision over the right inguinal ligament.  Dissection was carried down in the external oblique fascia.  We opened the fascia and dissected around the spermatic cord.  There was a large area of laxity in the inguinal floor, but no direct hernia defect.  We skeletonized the spermatic cord.  This is a moderate-sized hydrocele which was opened and drained.  Moderate-sized indirect hernia sac was reduced up to the internal ring along with the cord lipoma.  We tightened up the internal ring with 2-0 Vicryl.  We closed the floor of the inguinal canal with a 0 Vicryl.  Ultrapro mesh was cut in a keyhole shape, was secured with 2- 0 Prolene, beginning at the pubic tubercle.  We ran this along the shelving edge inferiorly and an internal oblique fascia superiorly.  The tails of the  mesh was sutured together behind the spermatic cord.  The fascia was reapproximated with 2-0 Vicryl.  We infiltrated the subcutaneous tissues and fascia with 20 mL Exparel.  3-0 Vicryl was used to close subcutaneous tissues and 4-0 Monocryl was used to close the skin.  Steri-Strips and clean dressings were applied.  The patient was extubated and brought to recovery in stable condition.  All sponge, instrument, and needle counts were correct.     Wilmon Arms. Corliss Skains, M.D.    MKT/MEDQ  D:  08/27/2011  T:  08/27/2011  Job:  [id number]  Electronically Signed by Manus Rudd M.D. on 08/27/2011 11:00:48 PM

## 2011-08-28 LAB — GLUCOSE, CAPILLARY: Glucose-Capillary: 120 mg/dL — ABNORMAL HIGH (ref 70–99)

## 2011-08-29 LAB — GLUCOSE, CAPILLARY: Glucose-Capillary: 105 mg/dL — ABNORMAL HIGH (ref 70–99)

## 2011-08-29 NOTE — Discharge Summary (Signed)
  NAMEJOE, GEE NO.:  [id number]  MEDICAL RECORD NO.:  [id number]  LOCATION:  1531                         FACILITY:  [facility]  PHYSICIAN:  Wilmon Arms. Corliss Skains, M.D. DATE OF BIRTH:  [birth year]  DATE OF ADMISSION:  08/27/2011 DATE OF DISCHARGE:  08/29/2011                              DISCHARGE SUMMARY   ADMISSION DIAGNOSIS:  Right inguinal hernia.  DISCHARGE DIAGNOSIS:  Right inguinal hernia.  BRIEF HISTORY:  This is an 77 year old male with baseline dementia, who presents with uncomfortable bulge in his right groin.  This was felt to be an inguinal hernia.  He presents now for repair.  HOSPITAL COURSE:  The patient underwent a hernia repair with mesh.  He was kept overnight due to his age and medical comorbidities.  However, he had difficulty with voiding and required an extra night of hospitalization.  He remains mildly confused, but is otherwise cooperative.  On the date of discharge, his incision has minimal amounts of bruising, but it otherwise looks good.  Minimal swelling.  The patient reports only limited amounts of pain.  DISCHARGE INSTRUCTIONS:  He is given Vicodin p.r.n. for pain.  He may resume all of his other home medications.  No lifting or straining.  He should take stool softeners and laxatives as needed to avoid constipation.  Follow up in 2 to 3 weeks.     Wilmon Arms. Corliss Skains, M.D.     MKT/MEDQ  D:  08/29/2011  T:  08/29/2011  Job:  [id number]  Electronically Signed by Manus Rudd M.D. on 08/29/2011 09:28:46 PM

## 2011-09-03 ENCOUNTER — Encounter (INDEPENDENT_AMBULATORY_CARE_PROVIDER_SITE_OTHER): Payer: Self-pay | Admitting: Surgery

## 2011-09-13 ENCOUNTER — Encounter (INDEPENDENT_AMBULATORY_CARE_PROVIDER_SITE_OTHER): Payer: Medicare Other | Admitting: Surgery

## 2011-09-30 ENCOUNTER — Encounter (INDEPENDENT_AMBULATORY_CARE_PROVIDER_SITE_OTHER): Payer: Medicare Other | Admitting: Surgery

## 2011-10-11 ENCOUNTER — Encounter (INDEPENDENT_AMBULATORY_CARE_PROVIDER_SITE_OTHER): Payer: Self-pay | Admitting: Surgery

## 2012-02-25 ENCOUNTER — Emergency Department (INDEPENDENT_AMBULATORY_CARE_PROVIDER_SITE_OTHER)
Admission: EM | Admit: 2012-02-25 | Discharge: 2012-02-25 | Disposition: A | Discharge status: Home / Self Care | Payer: PRIVATE HEALTH INSURANCE | Source: Home / Self Care | Attending: Emergency Medicine | Admitting: Emergency Medicine

## 2012-02-25 ENCOUNTER — Encounter (HOSPITAL_COMMUNITY): Payer: Self-pay | Admitting: *Deleted

## 2012-02-25 DIAGNOSIS — G4700 Insomnia, unspecified: Secondary | ICD-10-CM

## 2012-02-25 MED ORDER — HYDROXYZINE HCL 25 MG PO TABS: 25.0000 mg | ORAL_TABLET | Freq: Four times a day (QID) | ORAL | Status: AC

## 2012-02-25 NOTE — ED Provider Notes (Signed)
History     CSN: [id number]  Arrival date & time 02/25/12  1209   First MD Initiated Contact with Patient 02/25/12 1217      Chief Complaint  Patient presents with  . Medication Refill    (Consider location/radiation/quality/duration/timing/severity/associated sxs/prior treatment) HPI Comments: Patient presents urgent care today requesting a sleeping pill. Patient reports he has trouble falling asleep and wakes up the middle of the night several times. Describes it normally goes sleep around midnight and wakes up around 9 to 10 AM but wakes up during the night several times. [facility] in [facility] past Lunesta to help with his sleep. Patient describes that he was seen by his provider put did not get the prescription that he wanted.  The history is provided by the patient.    Past Medical History  Diagnosis Date  . Arthritis   . Cancer     prostate   . Stroke   . 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 Nasal congestion   . Hearing loss   . Difficulty urinating   . Weakness   . Dementia     Past Surgical History  Procedure Date  . Hernia repair 2012    History reviewed. No pertinent family history.  History  Substance Use Topics  . Smoking status: Former Games developer  . Smokeless tobacco: Not on file  . Alcohol Use: No      Review of Systems  Constitutional: Negative for activity change and appetite change.  Neurological: Negative for dizziness, weakness, light-headedness and headaches.  Psychiatric/Behavioral: Negative for confusion, dysphoric mood and agitation.    Allergies  Review of patient's allergies indicates no known allergies.  Home Medications   Current Outpatient Rx  Name Route Sig Dispense Refill  . DONEPEZIL HCL 10 MG PO TABS Oral Take 10 mg by mouth at bedtime as needed.      Marland Kitchen ESZOPICLONE 1 MG PO TABS Oral Take 1 mg by mouth at bedtime. Take immediately before bedtime     . HYDROXYZINE HCL 25 MG PO TABS  Oral Take 1 tablet (25 mg total) by mouth every 6 (six) hours. 12 tablet 0  . TROSPIUM CHLORIDE 20 MG PO TABS Oral Take 20 mg by mouth 2 (two) times daily.        BP 169/79  Pulse 96  Temp(Src) 98.2 F (36.8 C) (Oral)  Resp 16  SpO2 98%  Physical Exam  Nursing note and vitals reviewed. Constitutional: He is oriented to person, place, and time. He appears well-developed.  Non-toxic appearance. He does not have a sickly appearance. He does not appear ill. No distress.  Neurological: He is alert and oriented to person, place, and time.  Skin: He is not diaphoretic.  Psychiatric: He has a normal mood and affect. His behavior is normal. Thought content normal. His affect is not angry, not blunt, not labile and not inappropriate. His speech is not rapid and/or pressured, not delayed and not slurred. Cognition and memory are not impaired.    ED Course  Procedures (including critical care time)  Labs Reviewed - No data to display No results found.   1. Insomnia       MDM  Patient requesting further refills for for his Lunesta. Patient saw his primary care Dr. this week and is uncertain at this point what happened during the office encounter but he did not obtain a prescription. Presents here today to urgent  care accompanied by a home care assistant requesting something to help him sleep.        Jimmie Molly, MD 02/25/12 [phone]

## 2012-02-25 NOTE — ED Notes (Signed)
Pt   Requests  A  rx  For    A  Sleeping pill      -   He  Reports  He  Has  Not  Slept in  3  Days  He is  Taking  lunesta  But he  Says  It is  Not  Working  -  He  States  [facility]  [facility]  [facility]  And  Saw  Dr vollmer   Who  Would  Not  Give  Him a  rx  -  Caregiver  reposrts  Someone at [employer] at [facility]  Told  Him to come  Here    He  Is  Sitting upright on exam table       Speaking in  Complete  sentances

## 2012-02-25 NOTE — Discharge Instructions (Signed)
    Take this tablet 2 hours prior to sleep time. As discussed up to [facility] doctor this medicine OP induce her sleep we also talked about a leak about sleep habits that you need to change   Insomnia Insomnia means you have trouble falling or staying asleep. It affects about one person in three at different times and is usually related to stress from work, school, or personal relations. Insomnia is also a sign of depression or anxiety. Other medical problems that cause insomnia include conditions that cause pain, night leg cramps, coughing, shortness of breath, urinary problems, and fevers. Sleep apnea is an abnormal breathing pattern at night that can cause insomnia and loud snoring. Certain medications and excess intake of caffeine drinks (coffee, tea, colas) can also interfere with normal sleep. Treatment for insomnia depends on the cause. Besides specific medical treatment, the following measures can help you relax and get better sleep. Get regular exercise every day, at least several hours before bed time. Try to get to bed at the same time every night. Take a hot bath before retiring to help you relax. Do not stay in bed if you are unable to sleep. During the daytime avoid staying in bed to watch television, eat, or read. Reduce unwanted noise and light in your room. Keep your room at a comfortable temperature. Avoid alcohol as it causes one to sleep less soundly, may cause you to awaken during the night, and can leave you feeling groggy the next day. Using a mild sedative prescribed or suggested by your caregiver may be needed, but the daily use of sleeping pills is not recommended. Anti-depressant medicines can improve sleep in people with depression. Please call your doctor for follow up care to better understand the cause and proper treatment of your insomnia. Document Released: 11/21/2004 Document Revised: 10/03/2011 Document Reviewed: 10/14/2005 [facility] Patient Information 2012 [place],  [place].

## 2013-03-16 ENCOUNTER — Emergency Department (HOSPITAL_COMMUNITY)
Admission: EM | Admit: 2013-03-16 | Discharge: 2013-03-17 | Disposition: A | Discharge status: Home / Self Care | Payer: PRIVATE HEALTH INSURANCE | Attending: Emergency Medicine | Admitting: Emergency Medicine

## 2013-03-16 ENCOUNTER — Emergency Department (HOSPITAL_COMMUNITY): Payer: PRIVATE HEALTH INSURANCE

## 2013-03-16 DIAGNOSIS — S0180XA Unspecified open wound of other part of head, initial encounter: Secondary | ICD-10-CM | POA: Insufficient documentation

## 2013-03-16 DIAGNOSIS — S0510XA Contusion of eyeball and orbital tissues, unspecified eye, initial encounter: Secondary | ICD-10-CM | POA: Insufficient documentation

## 2013-03-16 DIAGNOSIS — F0390 Unspecified dementia without behavioral disturbance: Secondary | ICD-10-CM | POA: Insufficient documentation

## 2013-03-16 DIAGNOSIS — H9190 Unspecified hearing loss, unspecified ear: Secondary | ICD-10-CM | POA: Insufficient documentation

## 2013-03-16 DIAGNOSIS — Z79899 Other long term (current) drug therapy: Secondary | ICD-10-CM | POA: Insufficient documentation

## 2013-03-16 DIAGNOSIS — M129 Arthropathy, unspecified: Secondary | ICD-10-CM | POA: Insufficient documentation

## 2013-03-16 DIAGNOSIS — Z87891 Personal history of nicotine dependence: Secondary | ICD-10-CM | POA: Insufficient documentation

## 2013-03-16 DIAGNOSIS — Z8546 Personal history of malignant neoplasm of prostate: Secondary | ICD-10-CM | POA: Insufficient documentation

## 2013-03-16 DIAGNOSIS — Z8673 Personal history of transient ischemic attack (TIA), and cerebral infarction without residual deficits: Secondary | ICD-10-CM | POA: Insufficient documentation

## 2013-03-16 DIAGNOSIS — S0990XA Unspecified injury of head, initial encounter: Secondary | ICD-10-CM | POA: Insufficient documentation

## 2013-03-16 DIAGNOSIS — S058X2A Other injuries of left eye and orbit, initial encounter: Secondary | ICD-10-CM

## 2013-03-16 LAB — BASIC METABOLIC PANEL
CO2: 24 mEq/L (ref 19–32)
Calcium: 9.2 mg/dL (ref 8.4–10.5)
Glucose, Bld: 99 mg/dL (ref 70–99)
Sodium: 136 mEq/L (ref 135–145)

## 2013-03-16 LAB — CBC WITH DIFFERENTIAL/PLATELET
Eosinophils Relative: 5 % (ref 0–5)
HCT: 39.4 % (ref 39.0–52.0)
Hemoglobin: 13.5 g/dL (ref 13.0–17.0)
Lymphocytes Relative: 33 % (ref 12–46)
Lymphs Abs: 1.6 10*3/uL (ref 0.7–4.0)
MCV: 93.1 fL (ref 78.0–100.0)
Monocytes Absolute: 0.5 10*3/uL (ref 0.1–1.0)
Monocytes Relative: 10 % (ref 3–12)
RBC: 4.23 MIL/uL (ref 4.22–5.81)
WBC: 4.9 10*3/uL (ref 4.0–10.5)

## 2013-03-16 LAB — PROTIME-INR: Prothrombin Time: 12.7 seconds (ref 11.6–15.2)

## 2013-03-16 MED ORDER — HYDROCODONE-ACETAMINOPHEN 5-325 MG PO TABS
1.0000 | ORAL_TABLET | Freq: Once | ORAL | Status: AC
  Administered 2013-03-16: 1 via ORAL
  Filled 2013-03-16: qty 1

## 2013-03-16 MED ORDER — HYDROCODONE-ACETAMINOPHEN 5-325 MG PO TABS: 1.0000 | ORAL_TABLET | Freq: Four times a day (QID) | ORAL | Status: DC | PRN

## 2013-03-16 MED ORDER — TOBRAMYCIN-DEXAMETHASONE 0.3-0.1 % OP OINT
TOPICAL_OINTMENT | Freq: Four times a day (QID) | OPHTHALMIC | Status: DC
  Administered 2013-03-16: 23:00:00 via OPHTHALMIC
  Filled 2013-03-16: qty 3.5
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
## 2013-03-16 NOTE — ED Notes (Signed)
Spoke with [name] Rice from Social work-states pt will have to board in the ED if not admitted to see the social worker tomorrow at [phone] when they come in.
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
## 2013-03-16 NOTE — ED Notes (Signed)
Caregiver here-this Clinical research associate and off-duty [employer] spoke with her.  Per [facility] Department, they are not filing charges at this time.  Pt states to this Clinical research associate that he wants his caregiver in the room with him.  Care giver states she is his Medical POA, but does not have the paperwork with her.  Care giver, Ellie Lunch, states pt does has family, but "they don't have anything to do with him".
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
## 2013-03-17 NOTE — ED Notes (Signed)
Waiting on pt caregiver for D/C

## 2013-04-21 ENCOUNTER — Encounter (HOSPITAL_COMMUNITY): Payer: Self-pay | Admitting: *Deleted

## 2013-04-21 ENCOUNTER — Emergency Department (HOSPITAL_COMMUNITY): Payer: PRIVATE HEALTH INSURANCE

## 2013-04-21 ENCOUNTER — Emergency Department (HOSPITAL_COMMUNITY)
Admission: EM | Admit: 2013-04-21 | Discharge: 2013-04-21 | Disposition: A | Discharge status: Home / Self Care | Payer: PRIVATE HEALTH INSURANCE | Attending: Emergency Medicine | Admitting: Emergency Medicine

## 2013-04-21 DIAGNOSIS — Z79899 Other long term (current) drug therapy: Secondary | ICD-10-CM | POA: Insufficient documentation

## 2013-04-21 DIAGNOSIS — Z87891 Personal history of nicotine dependence: Secondary | ICD-10-CM | POA: Insufficient documentation

## 2013-04-21 DIAGNOSIS — F0390 Unspecified dementia without behavioral disturbance: Secondary | ICD-10-CM | POA: Insufficient documentation

## 2013-04-21 DIAGNOSIS — Z8546 Personal history of malignant neoplasm of prostate: Secondary | ICD-10-CM | POA: Insufficient documentation

## 2013-04-21 DIAGNOSIS — M129 Arthropathy, unspecified: Secondary | ICD-10-CM | POA: Insufficient documentation

## 2013-04-21 DIAGNOSIS — H9190 Unspecified hearing loss, unspecified ear: Secondary | ICD-10-CM | POA: Insufficient documentation

## 2013-04-21 DIAGNOSIS — K529 Noninfective gastroenteritis and colitis, unspecified: Secondary | ICD-10-CM

## 2013-04-21 DIAGNOSIS — K5289 Other specified noninfective gastroenteritis and colitis: Secondary | ICD-10-CM | POA: Insufficient documentation

## 2013-04-21 DIAGNOSIS — K5900 Constipation, unspecified: Secondary | ICD-10-CM | POA: Insufficient documentation

## 2013-04-21 DIAGNOSIS — R109 Unspecified abdominal pain: Secondary | ICD-10-CM | POA: Insufficient documentation

## 2013-04-21 DIAGNOSIS — Z8673 Personal history of transient ischemic attack (TIA), and cerebral infarction without residual deficits: Secondary | ICD-10-CM | POA: Insufficient documentation

## 2013-04-21 LAB — URINALYSIS, ROUTINE W REFLEX MICROSCOPIC
Bilirubin Urine: NEGATIVE
Ketones, ur: NEGATIVE mg/dL
Nitrite: NEGATIVE
Urobilinogen, UA: 0.2 mg/dL (ref 0.0–1.0)

## 2013-04-21 LAB — POCT I-STAT, CHEM 8
Calcium, Ion: 1.11 mmol/L — ABNORMAL LOW (ref 1.13–1.30)
Glucose, Bld: 90 mg/dL (ref 70–99)
HCT: 45 % (ref 39.0–52.0)
Hemoglobin: 15.3 g/dL (ref 13.0–17.0)
TCO2: 20 mmol/L (ref 0–100)

## 2013-04-21 LAB — COMPREHENSIVE METABOLIC PANEL
ALT: 6 U/L (ref 0–53)
Alkaline Phosphatase: 92 U/L (ref 39–117)
CO2: 22 mEq/L (ref 19–32)
Calcium: 9.4 mg/dL (ref 8.4–10.5)
Chloride: 101 mEq/L (ref 96–112)
GFR calc Af Amer: >90 mL/min (ref >90)
GFR calc non Af Amer: 79 mL/min — ABNORMAL LOW (ref >90)
Glucose, Bld: 92 mg/dL (ref 70–99)
Potassium: 4.1 mEq/L (ref 3.5–5.1)
Sodium: 136 mEq/L (ref 135–145)
Total Bilirubin: 0.4 mg/dL (ref 0.3–1.2)

## 2013-04-21 LAB — CBC
Hemoglobin: 14.3 g/dL (ref 13.0–17.0)
MCH: 31.8 pg (ref 26.0–34.0)
RBC: 4.5 MIL/uL (ref 4.22–5.81)

## 2013-04-21 MED ORDER — IOHEXOL 300 MG/ML  SOLN
80.0000 mL | Freq: Once | INTRAMUSCULAR | Status: AC | PRN
  Administered 2013-04-21: 80 mL via INTRAVENOUS

## 2013-04-21 MED ORDER — ONDANSETRON HCL 4 MG/2ML IJ SOLN
4.0000 mg | Freq: Once | INTRAMUSCULAR | Status: AC
  Administered 2013-04-21: 4 mg via INTRAVENOUS
  Filled 2013-04-21: qty 2

## 2013-04-21 MED ORDER — POLYETHYLENE GLYCOL 3350 17 GM/SCOOP PO POWD: 17.0000 g | Freq: Every day | ORAL | Status: DC

## 2013-04-21 MED ORDER — IOHEXOL 300 MG/ML  SOLN
50.0000 mL | Freq: Once | INTRAMUSCULAR | Status: AC | PRN
  Administered 2013-04-21: 50 mL via ORAL

## 2013-04-21 MED ORDER — FENTANYL CITRATE 0.05 MG/ML IJ SOLN
25.0000 ug | INTRAMUSCULAR | Status: DC | PRN
  Administered 2013-04-21: 25 ug via INTRAVENOUS
  Filled 2013-04-21: qty 2

## 2013-04-21 MED ORDER — AMOXICILLIN-POT CLAVULANATE 875-125 MG PO TABS: 1.0000 | ORAL_TABLET | Freq: Two times a day (BID) | ORAL | Status: DC

## 2013-04-21 MED ORDER — SODIUM CHLORIDE 0.9 % IV SOLN
INTRAVENOUS | Status: DC
  Administered 2013-04-21: 125 mL/h via INTRAVENOUS

## 2013-04-21 NOTE — ED Provider Notes (Signed)
CT showed colitis and constipation. Patient feels better. Will d/c home.  Juliet Rude. Rubin Payor, MD 04/21/13 [phone]

## 2013-04-21 NOTE — ED Notes (Signed)
ZOX:[id number]<AV> Expected date:04/21/13<BR> Expected time: 3:47 AM<BR> Means of arrival:Ambulance<BR> Comments:<BR> abd pain  constipation
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
## 2013-04-21 NOTE — ED Notes (Signed)
Pt reports having a BM.  Pt reports stool is very black.

## 2013-04-21 NOTE — ED Provider Notes (Signed)
History    CSN: [id number] Arrival date & time 04/21/13  0402  First MD Initiated Contact with Patient 04/21/13 [id number]     Chief Complaint  Patient presents with  . Constipation   (Consider location/radiation/quality/duration/timing/severity/associated sxs/prior Treatment) HPI Hx per PT - lower ABD pain worse over the last 4 days with no BM in 4 days, took a box of chocolate laxatives last night with no relief. Pain is sharp in quality. No N/V. No F/C. No dysuria/ hematuria. Pain is mod to severe, no known alleviating factors    Past Medical History  Diagnosis Date  . Arthritis   . Cancer     prostate   . Stroke   . 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 Nasal congestion   . Hearing loss   . Difficulty urinating   . Weakness   . Dementia    Past Surgical History  Procedure Laterality Date  . Hernia repair  2012   No family history on file. History  Substance Use Topics  . Smoking status: Former Games developer  . Smokeless tobacco: Not on file  . Alcohol Use: No    Review of Systems  Constitutional: Negative for fever and chills.  HENT: Negative for neck pain and neck stiffness.   Eyes: Negative for pain.  Respiratory: Negative for shortness of breath.   Cardiovascular: Negative for chest pain.  Gastrointestinal: Positive for abdominal pain.  Genitourinary: Negative for dysuria.  Musculoskeletal: Negative for back pain.  Skin: Negative for rash.  Neurological: Negative for headaches.  All other systems reviewed and are negative.    Allergies  Review of patient's allergies indicates no known allergies.  Home Medications   Current Outpatient Rx  Name  Route  Sig  Dispense  Refill  . donepezil (ARICEPT) 10 MG tablet   Oral   Take 10 mg by mouth at bedtime.          . eszopiclone (LUNESTA) 1 MG TABS   Oral   Take 1 mg by mouth at bedtime. Take immediately before bedtime          . trospium (SANCTURA) 20 MG tablet   Oral   Take 20 mg by  mouth 2 (two) times daily.            BP 99/64  Pulse 103  Temp(Src) 97.4 F (36.3 C) (Oral)  Resp 20  Ht 5\' 3"  (1.6 m)  Wt 125 lb (56.7 kg)  BMI 22.15 kg/m2  SpO2 90% Physical Exam  Constitutional: He is oriented to person, place, and time. He appears well-developed and well-nourished.  HENT:  Head: Normocephalic and atraumatic.  Eyes: EOM are normal. Pupils are equal, round, and reactive to light. No scleral icterus.  Neck: Neck supple.  Cardiovascular: Normal rate, regular rhythm and intact distal pulses.   Pulmonary/Chest: Effort normal and breath sounds normal. No respiratory distress.  Abdominal: Soft.  TTP RLQ/ LLQ with dec bowel sounds  Genitourinary:  Rectal exam: soft brown stool in rectal vault, no tenderness  Musculoskeletal: Normal range of motion. He exhibits no edema.  Neurological: He is alert and oriented to person, place, and time.  Skin: Skin is warm and dry.    ED Course  Procedures (including critical care time) Results for orders placed during the hospital encounter of 04/21/13  CBC      Result Value Range   WBC 9.0  4.0 - 10.5 K/uL   RBC 4.50  4.22 -  5.81 MIL/uL   Hemoglobin 14.3  13.0 - 17.0 g/dL   HCT 96.0  45.4 - 09.8 %   MCV 92.4  78.0 - 100.0 fL   MCH 31.8  26.0 - 34.0 pg   MCHC 34.4  30.0 - 36.0 g/dL   RDW 11.9  14.7 - 82.9 %   Platelets 220  150 - 400 K/uL  COMPREHENSIVE METABOLIC PANEL      Result Value Range   Sodium 136  135 - 145 mEq/L   Potassium 4.1  3.5 - 5.1 mEq/L   Chloride 101  96 - 112 mEq/L   CO2 22  19 - 32 mEq/L   Glucose, Bld 92  70 - 99 mg/dL   BUN 11  6 - 23 mg/dL   Creatinine, Ser 5.62  0.50 - 1.35 mg/dL   Calcium 9.4  8.4 - 13.0 mg/dL   Total Protein 6.9  6.0 - 8.3 g/dL   Albumin 3.4 (*) 3.5 - 5.2 g/dL   AST 20  0 - 37 U/L   ALT 6  0 - 53 U/L   Alkaline Phosphatase 92  39 - 117 U/L   Total Bilirubin 0.4  0.3 - 1.2 mg/dL   GFR calc non Af Amer 79 (*) >90 mL/min   GFR calc Af Amer >90  >90 mL/min  LIPASE,  BLOOD      Result Value Range   Lipase 14  11 - 59 U/L  URINALYSIS, ROUTINE W REFLEX MICROSCOPIC      Result Value Range   Color, Urine YELLOW  YELLOW   APPearance CLEAR  CLEAR   Specific Gravity, Urine 1.019  1.005 - 1.030   pH 5.0  5.0 - 8.0   Glucose, UA NEGATIVE  NEGATIVE mg/dL   Hgb urine dipstick NEGATIVE  NEGATIVE   Bilirubin Urine NEGATIVE  NEGATIVE   Ketones, ur NEGATIVE  NEGATIVE mg/dL   Protein, ur NEGATIVE  NEGATIVE mg/dL   Urobilinogen, UA 0.2  0.0 - 1.0 mg/dL   Nitrite NEGATIVE  NEGATIVE   Leukocytes, UA NEGATIVE  NEGATIVE  POCT I-STAT, CHEM 8      Result Value Range   Sodium 138  135 - 145 mEq/L   Potassium 4.0  3.5 - 5.1 mEq/L   Chloride 104  96 - 112 mEq/L   BUN 10  6 - 23 mg/dL   Creatinine, Ser 8.65  0.50 - 1.35 mg/dL   Glucose, Bld 90  70 - 99 mg/dL   Calcium, Ion 7.84 (*) 1.13 - 1.30 mmol/L   TCO2 20  0 - 100 mmol/L   Hemoglobin 15.3  13.0 - 17.0 g/dL   HCT 69.6  29.5 - 28.4 %   IVFs, IV fentanyl CT scan pending and care Tx to Dr Rubin Payor MDM  ABD pain/ constipation/ tender lower ABD  Labs  IV narcotics  CT scan to evaluate    Sunnie Nielsen, MD 04/21/13 2309

## 2013-07-15 ENCOUNTER — Observation Stay (HOSPITAL_COMMUNITY)
Admission: EM | Admit: 2013-07-15 | Discharge: 2013-07-16 | Disposition: A | Discharge status: Home / Self Care | Payer: PRIVATE HEALTH INSURANCE | Attending: Internal Medicine | Admitting: Internal Medicine

## 2013-07-15 ENCOUNTER — Encounter (HOSPITAL_COMMUNITY): Payer: Self-pay | Admitting: Emergency Medicine

## 2013-07-15 ENCOUNTER — Emergency Department (HOSPITAL_COMMUNITY): Payer: PRIVATE HEALTH INSURANCE

## 2013-07-15 DIAGNOSIS — G309 Alzheimer's disease, unspecified: Secondary | ICD-10-CM | POA: Insufficient documentation

## 2013-07-15 DIAGNOSIS — S2249XK Multiple fractures of ribs, unspecified side, subsequent encounter for fracture with nonunion: Secondary | ICD-10-CM

## 2013-07-15 DIAGNOSIS — R61 Generalized hyperhidrosis: Secondary | ICD-10-CM | POA: Insufficient documentation

## 2013-07-15 DIAGNOSIS — Z8546 Personal history of malignant neoplasm of prostate: Secondary | ICD-10-CM | POA: Insufficient documentation

## 2013-07-15 DIAGNOSIS — K4090 Unilateral inguinal hernia, without obstruction or gangrene, not specified as recurrent: Secondary | ICD-10-CM

## 2013-07-15 DIAGNOSIS — R269 Unspecified abnormalities of gait and mobility: Secondary | ICD-10-CM

## 2013-07-15 DIAGNOSIS — I700 Atherosclerosis of aorta: Secondary | ICD-10-CM | POA: Insufficient documentation

## 2013-07-15 DIAGNOSIS — G4700 Insomnia, unspecified: Secondary | ICD-10-CM

## 2013-07-15 DIAGNOSIS — R079 Chest pain, unspecified: Principal | ICD-10-CM

## 2013-07-15 DIAGNOSIS — F0280 Dementia in other diseases classified elsewhere without behavioral disturbance: Secondary | ICD-10-CM

## 2013-07-15 DIAGNOSIS — K449 Diaphragmatic hernia without obstruction or gangrene: Secondary | ICD-10-CM | POA: Insufficient documentation

## 2013-07-15 DIAGNOSIS — M25579 Pain in unspecified ankle and joints of unspecified foot: Secondary | ICD-10-CM

## 2013-07-15 DIAGNOSIS — F1010 Alcohol abuse, uncomplicated: Secondary | ICD-10-CM

## 2013-07-15 DIAGNOSIS — R439 Unspecified disturbances of smell and taste: Secondary | ICD-10-CM

## 2013-07-15 DIAGNOSIS — C61 Malignant neoplasm of prostate: Secondary | ICD-10-CM

## 2013-07-15 DIAGNOSIS — Z8673 Personal history of transient ischemic attack (TIA), and cerebral infarction without residual deficits: Secondary | ICD-10-CM | POA: Insufficient documentation

## 2013-07-15 DIAGNOSIS — R0602 Shortness of breath: Secondary | ICD-10-CM

## 2013-07-15 LAB — CBC
MCH: 33 pg (ref 26.0–34.0)
MCHC: 35 g/dL (ref 30.0–36.0)
MCV: 94.3 fL (ref 78.0–100.0)
Platelets: 241 10*3/uL (ref 150–400)

## 2013-07-15 LAB — BASIC METABOLIC PANEL
Calcium: 8.8 mg/dL (ref 8.4–10.5)
Creatinine, Ser: 0.83 mg/dL (ref 0.50–1.35)
GFR calc non Af Amer: 77 mL/min — ABNORMAL LOW (ref >90)
Glucose, Bld: 94 mg/dL (ref 70–99)
Sodium: 136 mEq/L (ref 135–145)

## 2013-07-15 LAB — POCT I-STAT TROPONIN I

## 2013-07-15 LAB — TROPONIN I: Troponin I: <0.3 ng/mL (ref <0.30)

## 2013-07-15 MED ORDER — MORPHINE SULFATE 2 MG/ML IJ SOLN: 2.0000 mg | INTRAMUSCULAR | Status: DC | PRN

## 2013-07-15 MED ORDER — SERTRALINE HCL 25 MG PO TABS
25.0000 mg | ORAL_TABLET | Freq: Every day | ORAL | Status: DC
  Administered 2013-07-16 (×2): 25 mg via ORAL
  Filled 2013-07-15 (×2): qty 1

## 2013-07-15 MED ORDER — ONDANSETRON 8 MG PO TBDP
8.0000 mg | ORAL_TABLET | Freq: Once | ORAL | Status: AC
  Administered 2013-07-15: 8 mg via ORAL
  Filled 2013-07-15: qty 1

## 2013-07-15 MED ORDER — OXYCODONE-ACETAMINOPHEN 5-325 MG PO TABS
1.0000 | ORAL_TABLET | ORAL | Status: DC | PRN
  Administered 2013-07-16: 1 via ORAL
  Filled 2013-07-15: qty 1

## 2013-07-15 MED ORDER — HYDROCODONE-ACETAMINOPHEN 5-325 MG PO TABS
2.0000 | ORAL_TABLET | Freq: Once | ORAL | Status: AC
  Administered 2013-07-15: 2 via ORAL
  Filled 2013-07-15: qty 2

## 2013-07-15 MED ORDER — ALPRAZOLAM 0.25 MG PO TABS: 0.2500 mg | ORAL_TABLET | Freq: Two times a day (BID) | ORAL | Status: DC | PRN

## 2013-07-15 MED ORDER — ACETAMINOPHEN 325 MG PO TABS
650.0000 mg | ORAL_TABLET | ORAL | Status: DC | PRN
  Administered 2013-07-16: 650 mg via ORAL
  Filled 2013-07-15: qty 2

## 2013-07-15 MED ORDER — ONDANSETRON HCL 4 MG/2ML IJ SOLN: 4.0000 mg | Freq: Four times a day (QID) | INTRAMUSCULAR | Status: DC | PRN

## 2013-07-15 MED ORDER — NON FORMULARY: 1.0000 | Freq: Every day | Status: DC

## 2013-07-15 MED ORDER — ZOLPIDEM TARTRATE 5 MG PO TABS
5.0000 mg | ORAL_TABLET | Freq: Every day | ORAL | Status: DC
  Administered 2013-07-16: 5 mg via ORAL
  Filled 2013-07-15: qty 1

## 2013-07-15 MED ORDER — DONEPEZIL HCL 10 MG PO TABS
10.0000 mg | ORAL_TABLET | Freq: Every day | ORAL | Status: DC
  Administered 2013-07-16: 10 mg via ORAL
  Filled 2013-07-15 (×2): qty 1

## 2013-07-15 MED ORDER — ENOXAPARIN SODIUM 40 MG/0.4ML ~~LOC~~ SOLN
40.0000 mg | SUBCUTANEOUS | Status: DC
  Administered 2013-07-16: 40 mg via SUBCUTANEOUS
  Filled 2013-07-15 (×2): qty 0.4

## 2013-07-15 NOTE — ED Notes (Addendum)
Pt from [facility], Pt c/o left sided chest pain along with pain in both arms for about 2 days. C/o SOB as well, denies any other symptoms.

## 2013-07-15 NOTE — H&P (Signed)
[facility] History and Physical  [name] Rice ZOX:[id number] DOB: [birth year] DOA: 07/15/2013  Referring physician: ED physician PCP: No primary provider on file.   Chief Complaint: chest pain   HPI:  79 year old male with history of Arthritis, prostate cancer, stroke, alcohol abuse, hearing loss, dementia who presented to [facility] ED with main concern of progressively worsening substernal chest pain, pressure like, woke him  Up at 3 am and lasted about 15 minutes and resolved on its own. He explains this was associated with brief period of shortness of breath and the pain radiated to bilateral arms. Pt explains this has now been resolved and he would like to go home. He denies any pain at the time of this interview and denies any shortness of breath, no specific abdominal or urinary concerns. TRH asked to admit for observation and ACS rule out. First set of troponin is negative and 12 lead EKG is unremarkable.   Assessment and Plan:  Principal Problem:   CHEST PAIN - unclear etiology and certainly worrisome for cardiac etiology given advanced age but pt has no significant cardiac history which is encouraging - will admit for observation - monitor on telemetry and check TSH, CE's x 3 sets  - repeat 12 lead EKG in AM - provide supportive care with analgesia and antiemetics as needed  Active Problems:   ALCOHOL ABUSE - pt denies recent use - will place on CIWA protocol just in case    Alzheimer's disease - appears to be stable and at pt's baseline    Code Status: Full Family Communication: Pt at bedside Disposition Plan: Admit to telemetry bed    Review of Systems:  Constitutional: Negative for fever, chills and malaise/fatigue. Negative for diaphoresis.  HENT: Negative for hearing loss, ear pain, nosebleeds, congestion, sore throat, neck pain, tinnitus and ear discharge.   Eyes: Negative for blurred vision, double vision, photophobia, pain, discharge and redness.  Respiratory:  Negative for cough, hemoptysis, sputum production, wheezing and stridor.   Cardiovascular: Negative for palpitations, orthopnea, claudication and leg swelling.  Gastrointestinal: Negative for nausea, vomiting and abdominal pain. Negative for heartburn, constipation, blood in stool and melena.  Genitourinary: Negative for dysuria, urgency, frequency, hematuria and flank pain.  Musculoskeletal: Negative for myalgias, back pain, joint pain and falls.  Skin: Negative for itching and rash.  Neurological: Negative for tingling, tremors, sensory change, speech change, focal weakness, loss of consciousness and headaches.  Endo/Heme/Allergies: Negative for environmental allergies and polydipsia. Does not bruise/bleed easily.  Psychiatric/Behavioral: Negative for suicidal ideas. The patient is not nervous/anxious.      Past Medical History  Diagnosis Date  . Arthritis   . Cancer     prostate   . Stroke   . 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 Nasal congestion   . Hearing loss   . Difficulty urinating   . Weakness   . Dementia     Past Surgical History  Procedure Laterality Date  . Hernia repair  2012    Social History:  reports that he has quit smoking. He does not have any smokeless tobacco history on file. He reports that he does not drink alcohol or use illicit drugs.  No Known Allergies  No known family medical history   Prior to Admission medications   Medication Sig Start Date End Date Taking? Authorizing Provider  donepezil (ARICEPT) 10 MG tablet Take 10 mg by mouth at bedtime.   Yes Historical Provider, MD  eszopiclone (  LUNESTA) 1 MG TABS Take 1 mg by mouth at bedtime. Take immediately before bedtime    Yes Historical Provider, MD  sertraline (ZOLOFT) 25 MG tablet Take 25 mg by mouth daily.   Yes Historical Provider, MD    Physical Exam: Filed Vitals:   07/15/13 1721  BP: 135/80  Temp: 98.3 F (36.8 C)  TempSrc: Oral  Resp: 20  SpO2: 96%    Physical  Exam  Constitutional: Appears well-developed and well-nourished. No distress.  HENT: Normocephalic. External right and left ear normal. Oropharynx is clear and moist.  Eyes: Conjunctivae and EOM are normal. PERRLA, no scleral icterus.  Neck: Normal ROM. Neck supple. No JVD. No tracheal deviation. No thyromegaly.  CVS: RRR, S1/S2 +, no murmurs, no gallops, no carotid bruit.  Pulmonary: Effort and breath sounds normal, no stridor, rhonchi, wheezes, rales.  Abdominal: Soft. BS +,  no distension, tenderness, rebound or guarding.  Musculoskeletal: Normal range of motion. No edema and no tenderness.  Lymphadenopathy: No lymphadenopathy noted, cervical, inguinal. Neuro: Alert. Normal reflexes, muscle tone coordination. No cranial nerve deficit. Skin: Skin is warm and dry. No rash noted. Not diaphoretic. No erythema. No pallor.  Psychiatric: Normal mood and affect. Behavior, judgment, thought content normal.   Labs on Admission:  Basic Metabolic Panel:  Recent Labs Lab 07/15/13 1759  NA 136  K 4.0  CL 102  CO2 26  GLUCOSE 94  BUN 11  CREATININE 0.83  CALCIUM 8.8   CBC:  Recent Labs Lab 07/15/13 1759  WBC 5.2  HGB 14.4  HCT 41.1  MCV 94.3  PLT 241   Radiological Exams on Admission: Dg Chest 2 View 07/15/2013    1. Mild diffuse peribronchial cuffing may suggest acute or chronic bronchitis. Clinical correlation is recommended.  2. Atherosclerosis.  3. Large hiatal hernia.     EKG: Normal sinus rhythm, no ST/T wave changes  Debbora Presto, MD  [facility] Pager [phone]  If 7PM-7AM, please contact night-coverage [email or web address] Password [facility] 07/15/2013, 8:43 PM

## 2013-07-15 NOTE — ED Notes (Signed)
MD at bedside. 

## 2013-07-15 NOTE — ED Provider Notes (Signed)
  This was a shared visit with a mid-level provided (NP or PA).  Throughout the patient's course I was available for consultation/collaboration.  I saw the ECG (if appropriate), relevant labs and studies - I agree with the interpretation.  On my exam the patient was in no distress.  However, the patient was oriented only to self, time, grossly to place.  With his history of dementia, his description of pain which may represent atypical ischemia, patient was admitted for further evaluation and management      Gerhard Munch, MD 07/15/13 2307

## 2013-07-15 NOTE — ED Provider Notes (Signed)
CSN: [id number]     Arrival date & time 07/15/13  1702 History   First MD Initiated Contact with Patient 07/15/13 1732     Chief Complaint  Patient presents with  . Chest Pain   (Consider location/radiation/quality/duration/timing/severity/associated sxs/prior Treatment) HPI Comments: Patient is an 79 year old male with history of Arthritis, prostate cancer, stroke, substance abuse, hearing loss, weakness, dementia who presents today with sudden onset of chest pain. The chest pain woke him from sleep around 3 AM. Lasted for approximately 15 minutes and resolved without intervention. The patient reports that he did lean forward which helped the pain. It is associated with shortness of breath and diaphoresis. The pain radiated into both of his arms bilaterally. He currently feels well and would like to go home. He reports that he has no cardiac history and does not have a cardiologist. He is oriented to person, place, and time.   Patient is a 79 y.o. male presenting with chest pain. The history is provided by the patient. No language interpreter was used.  Chest Pain Associated symptoms: diaphoresis and shortness of breath   Associated symptoms: no abdominal pain and not vomiting     Past Medical History  Diagnosis Date  . Arthritis   . Cancer     prostate   . Stroke   . 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 Nasal congestion   . Hearing loss   . Difficulty urinating   . Weakness   . Dementia    Past Surgical History  Procedure Laterality Date  . Hernia repair  2012   No family history on file. History  Substance Use Topics  . Smoking status: Former Games developer  . Smokeless tobacco: Not on file  . Alcohol Use: No    Review of Systems  Constitutional: Positive for diaphoresis.  Respiratory: Positive for shortness of breath.   Cardiovascular: Positive for chest pain.  Gastrointestinal: Negative for vomiting and abdominal pain.  All other systems reviewed and are  negative.    Allergies  Review of patient's allergies indicates no known allergies.  Home Medications   Current Outpatient Rx  Name  Route  Sig  Dispense  Refill  . donepezil (ARICEPT) 10 MG tablet   Oral   Take 10 mg by mouth at bedtime.         . eszopiclone (LUNESTA) 1 MG TABS   Oral   Take 1 mg by mouth at bedtime. Take immediately before bedtime          . Multiple Vitamin (MULTIVITAMIN WITH MINERALS) TABS tablet   Oral   Take 1 tablet by mouth daily.         . sertraline (ZOLOFT) 25 MG tablet   Oral   Take 25 mg by mouth daily.          BP 135/80  Temp(Src) 98.3 F (36.8 C) (Oral)  Resp 20  SpO2 96% Physical Exam  Nursing note and vitals reviewed. Constitutional: He is oriented to person, place, and time. He appears well-developed and well-nourished. No distress.  HENT:  Head: Normocephalic and atraumatic.  Right Ear: External ear normal.  Left Ear: External ear normal.  Nose: Nose normal.  Eyes: Conjunctivae are normal. Right pupil is round and reactive. Left pupil is not round. Pupils are unequal.  Coloboma of left iris (x 3 months)  Neck: Normal range of motion. No tracheal deviation present.  Cardiovascular: Normal rate, regular rhythm and normal heart sounds.  Pulmonary/Chest: Effort normal and breath sounds normal. No stridor.  Abdominal: Soft. He exhibits no distension. There is no tenderness.  Musculoskeletal: Normal range of motion.  Neurological: He is alert and oriented to person, place, and time.  Skin: Skin is warm and dry. He is not diaphoretic.  Psychiatric: He has a normal mood and affect. His behavior is normal.    ED Course  Procedures (including critical care time) Labs Review Labs Reviewed  BASIC METABOLIC PANEL - Abnormal; Notable for the following:    GFR calc non Af Amer 77 (*)    GFR calc Af Amer 90 (*)    All other components within normal limits  CBC  POCT I-STAT TROPONIN I   Imaging Review Dg Chest 2  View  07/15/2013   CLINICAL DATA:  Left-sided chest pain and neck pain. Shortness of breath.  EXAM: CHEST  2 VIEW  COMPARISON:  CHEST x-ray 01/15/2011.  FINDINGS: Lung volumes are normal. No consolidative airspace disease. No pleural effusions. Mild diffuse peribronchial cuffing. No evidence of pulmonary edema. Heart size is normal. Large hiatal hernia. Upper mediastinal contours are within normal limits. Atherosclerosis in the thoracic aorta. Extensive bilateral apical pleural parenchymal thickening, partially calcified, most compatible with chronic post infectious or inflammatory scarring.  IMPRESSION: 1. Mild diffuse peribronchial cuffing may suggest acute or chronic bronchitis. Clinical correlation is recommended. 2. Atherosclerosis. 3. Large hiatal hernia.   Electronically Signed   By: Trudie Reed M.D.   On: 07/15/2013 18:40    MDM   1. Chest pain    Concern for cardiac etiology of Chest Pain. Cardiology has been consulted and will see patient in the ED for likely admit. Pt does not meet criteria for CP protocol and a further evaluation is recommended. Pt has been re-evaluated prior to consult and VSS, NAD, heart RRR, pain 0/10, lungs CTAB. No acute abnormalities found on EKG and first round of cardiac enzymes negative. This case was discussed with Dr. Jeraldine Loots who has seen the patient and agrees with plan to admit.      Mora Bellman, PA-C 07/15/13 2023

## 2013-07-16 DIAGNOSIS — IMO0002 Reserved for concepts with insufficient information to code with codable children: Secondary | ICD-10-CM

## 2013-07-16 DIAGNOSIS — F0280 Dementia in other diseases classified elsewhere without behavioral disturbance: Secondary | ICD-10-CM

## 2013-07-16 LAB — TSH: TSH: 1.617 u[IU]/mL (ref 0.350–4.500)

## 2013-07-16 LAB — CBC
HCT: 42.4 % (ref 39.0–52.0)
MCHC: 34 g/dL (ref 30.0–36.0)
Platelets: 253 10*3/uL (ref 150–400)
RDW: 14.1 % (ref 11.5–15.5)

## 2013-07-16 LAB — BASIC METABOLIC PANEL
BUN: 12 mg/dL (ref 6–23)
Calcium: 8.6 mg/dL (ref 8.4–10.5)
GFR calc Af Amer: 85 mL/min — ABNORMAL LOW (ref >90)
GFR calc non Af Amer: 73 mL/min — ABNORMAL LOW (ref >90)
Glucose, Bld: 93 mg/dL (ref 70–99)
Potassium: 3.5 mEq/L (ref 3.5–5.1)
Sodium: 139 mEq/L (ref 135–145)

## 2013-07-16 LAB — TROPONIN I
Troponin I: <0.3 ng/mL (ref <0.30)
Troponin I: <0.3 ng/mL (ref <0.30)

## 2013-07-16 MED ORDER — INFLUENZA VAC SPLIT QUAD 0.5 ML IM SUSP
0.5000 mL | Freq: Once | INTRAMUSCULAR | Status: AC
  Administered 2013-07-16: 0.5 mL via INTRAMUSCULAR
  Filled 2013-07-16: qty 0.5

## 2013-07-16 NOTE — Progress Notes (Signed)
07/16/13 1112  PT Time Calculation  PT Start Time 1053  PT Stop Time 1104  PT Time Calculation (min) 11 min  PT G-Codes **NOT FOR INPATIENT CLASS**  Functional Assessment Tool Used (clinical judgement)  Functional Limitation Mobility: Walking and moving around  Mobility: Walking and Moving Around Current Status (N8295) CJ  Mobility: Walking and Moving Around Goal Status (A2130) CI  PT General Charges  $$ ACUTE PT VISIT 1 Procedure  PT Evaluation  $Initial PT Evaluation Tier I 1 Procedure  PT Treatments  $Gait Training 8-22 mins   Rebeca Alert, [place] [phone]

## 2013-07-16 NOTE — Progress Notes (Signed)
Pt selected [facility] for [facility] needs. Appointment with [facility] 9/23 at 2:30 PM.

## 2013-07-16 NOTE — Progress Notes (Signed)
[facility]  Patient Status: New  [facility] is providing the following services: RN, PT, OT and HHA. I have called and set up and appointment for the patient at the [facility] at [address] [place] [address], for Tuesday 07/20/13 at 2:30pm.  Phone # [phone]  If patient discharges after hours, please call [phone].   Lanae Crumbly 07/16/2013, 1:13 PM

## 2013-07-16 NOTE — Evaluation (Signed)
Physical Therapy Evaluation Patient Details Name: [name] Rice MRN: [id number] DOB: Jan 06, 1925 Today's Date: 07/16/2013 Time: 4098-1191 PT Time Calculation (min): 11 min  PT Assessment / Plan / Recommendation History of Present Illness  79 yo male admitted with chest pain. Hx of prostate cancer, CVA, ETOH abuse, dementia  Clinical Impression  On eval, pt required Min guard-Min assist for mobility-able to ambulate ~75 feet without assistive device. Brief assessment of pt's balance-LOB x 2 with attempts to maintain narrow BOS and after making 360 degree turn. Recommend cane use and HHPT for balance training.     PT Assessment  Patient needs continued PT services    Follow Up Recommendations  Home health PT (for balance training)    Does the patient have the potential to tolerate intense rehabilitation      Barriers to Discharge        Equipment Recommendations  None recommended by PT    Recommendations for Other Services OT consult   Frequency Min 3X/week    Precautions / Restrictions Precautions Precautions: Fall Restrictions Weight Bearing Restrictions: No   Pertinent Vitals/Pain "burning" bil upper arms-unrated      Mobility  Bed Mobility Bed Mobility: Supine to Sit;Sit to Supine Supine to Sit: 6: Modified independent (Device/Increase time) Sit to Supine: 6: Modified independent (Device/Increase time) Transfers Transfers: Sit to Stand;Stand to Sit Sit to Stand: From bed;6: Modified independent (Device/Increase time) Stand to Sit: 6: Modified independent (Device/Increase time);To bed Ambulation/Gait Ambulation/Gait Assistance: 4: Min guard Ambulation Distance (Feet): 75 Feet Assistive device: None Ambulation/Gait Assistance Details: Short distance since pt only walks short distances at baseline (per pt household ambulation mostly). good gait speed.  Gait Pattern: Step-through pattern;Decreased stride length Stairs: No    Exercises     PT Diagnosis: Difficulty  walking  PT Problem List: Decreased balance PT Treatment Interventions: Gait training;DME instruction;Balance training;Patient/family education;Therapeutic activities;Therapeutic exercise;Functional mobility training     PT Goals(Current goals can be found in the care plan section) Acute Rehab PT Goals Patient Stated Goal: home PT Goal Formulation: With patient Time For Goal Achievement: 07/30/13 Potential to Achieve Goals: Good  Visit Information  Last PT Received On: 07/16/13 Assistance Needed: +1 History of Present Illness: 79 yo male admitted with chest pain. Hx of prostate cancer, CVA, ETOH abuse, dementia       Prior Functioning  Home Living Family/patient expects to be discharged to:: Private residence Living Arrangements: Alone Available Help at Discharge: Family Type of Home: Mobile home Home Access: Stairs to enter Entrance Stairs-Number of Steps: 3-4 Entrance Stairs-Rails: Right Home Layout: One level Home Equipment: Wheelchair - power;Cane - single point Additional Comments: granddaughter comes by "every other day" to administer medicines, transportation, grocery shopping Prior Function Level of Independence: Independent with assistive device(s) Communication Communication: HOH    Cognition  Cognition Arousal/Alertness: Awake/alert Behavior During Therapy: WFL for tasks assessed/performed Overall Cognitive Status: Within Functional Limits for tasks assessed    Extremity/Trunk Assessment Upper Extremity Assessment Upper Extremity Assessment: Overall WFL for tasks assessed Lower Extremity Assessment Lower Extremity Assessment: Generalized weakness Cervical / Trunk Assessment Cervical / Trunk Assessment: Normal   Balance Balance Balance Assessed: Yes Static Standing Balance Static Standing - Balance Support: No upper extremity supported Static Standing - Level of Assistance: 5: Stand by assistance Static Standing - Comment/# of Minutes: EO/EC,  withstanding of external perturbations -no difficulty. Pt  did have difficulty achieving/maintaining narrow BOS-only able to maintain for ~2 seconds before needing to use step strategy for LOB.  Dynamic Standing Balance Dynamic Standing - Balance Support: No upper extremity supported Dynamic Standing - Level of Assistance: 4: Min assist Dynamic Standing - Comments: 360 degree turn, pick up object - LOB at end of turn towards R side-required external assist to correct. Able topick up object but unsteady.   End of Session PT - End of Session Equipment Utilized During Treatment: Gait belt Activity Tolerance: Patient tolerated treatment well Patient left: in bed;with call bell/phone within reach;with bed alarm set  GP     Rebeca Alert, MPT Pager: [phone]

## 2013-07-16 NOTE — Discharge Summary (Signed)
Physician Discharge Summary  [name] Rice ZOX:[id number] DOB: 08/20/1925 DOA: 07/15/2013  PCP: No primary provider on file.  Admit date: 07/15/2013 Discharge date: 07/16/2013  Recommendations for Outpatient Follow-up:  1. you were hospitalized for chest pain. Your heart enzymes were within the normal limits. Please followup with your primary care physician in one to 2 weeks after discharge to make sure symptoms are controlled.  2. blood pressure at the time of discharge is 157/75. We will not start blood pressure medications during this hospital stay. Please followup with primary care physician and have your blood pressure rechecked. They may decide to start you on blood pressure medications.   Discharge Diagnoses:  Principal Problem:   CHEST PAIN Active Problems:   ALCOHOL ABUSE   Alzheimer's disease    Discharge Condition: Medically stable for discharge home today. Order placed for home health nurse and aide, PT/OT  Diet recommendation: as tolerated  History of present illness:  79 year old male with history of Arthritis, prostate cancer, stroke, alcohol abuse, hearing loss, dementia who presented to [facility] ED with main concern of progressively worsening substernal chest pain, pressure like, woke him up at 3 am and lasted about 15 minutes and resolved on its own. He explains this was associated with brief period of shortness of breath and the pain radiated to bilateral arms.   Assessment and Plan:   Principal Problem:  CHEST PAIN  - unclear etiology and certainly worrisome for cardiac etiology given advanced age but pt has no significant cardiac history which is encouraging  - 12-lead EKG within normal limits, cardiac enzymes for 3 sets are negative. Chest pain has completely resolved at this time. Patient wants to go home today.  Active Problems:  ALCOHOL ABUSE  - pt denies recent use   Alzheimer's disease  - appears to be stable and at pt's baseline   Code Status: Full  Family  Communication: Pt at bedside    Signed:  Manson Passey, MD  [facility] 07/16/2013, 11:29 AM  Pager #: [phone]  Procedures:  None   Consultations:  None   Discharge Exam: Filed Vitals:   07/16/13 0636  BP: 157/75  Pulse: 82  Temp: 97.8 F (36.6 C)  Resp: 20   Filed Vitals:   07/15/13 1721 07/15/13 2131 07/16/13 0636  BP: 135/80 142/70 157/75  Pulse:  79 82  Temp: 98.3 F (36.8 C) 98.3 F (36.8 C) 97.8 F (36.6 C)  TempSrc: Oral Oral Oral  Resp: 20 20 20   Height:  5\' 4"  (1.626 m)   Weight:  51.1 kg (112 lb 10.5 oz)   SpO2: 96% 94% 95%    General: Pt is alert, follows commands appropriately, not in acute distress Cardiovascular: Regular rate and rhythm, S1/S2 +, no murmurs, no rubs, no gallops Respiratory: Clear to auscultation bilaterally, no wheezing, no crackles, no rhonchi Abdominal: Soft, non tender, non distended, bowel sounds +, no guarding Extremities: no edema, no cyanosis, pulses palpable bilaterally DP and PT Neuro: Grossly nonfocal  Discharge Instructions  Discharge Orders   Future Orders Complete By Expires   Call MD for:  difficulty breathing, headache or visual disturbances  As directed    Call MD for:  persistant dizziness or light-headedness  As directed    Call MD for:  persistant nausea and vomiting  As directed    Call MD for:  severe uncontrolled pain  As directed    Diet - low sodium heart healthy  As directed    Discharge instructions  As  directed    Comments:     1. you were hospitalized for chest pain. Your heart enzymes were within the normal limits. Please followup with your primary care physician in one to 2 weeks after discharge to make sure symptoms are controlled. 2. blood pressure at the time of discharge is 157/75. We will not start blood pressure medications during this hospital stay. Please followup with primary care physician and have your blood pressure rechecked. They may decide to start you on blood pressure  medications.   Increase activity slowly  As directed        Medication List         donepezil 10 MG tablet  Commonly known as:  ARICEPT  Take 10 mg by mouth at bedtime.     eszopiclone 1 MG Tabs tablet  Commonly known as:  LUNESTA  Take 1 mg by mouth at bedtime. Take immediately before bedtime     multivitamin with minerals Tabs tablet  Take 1 tablet by mouth daily.     sertraline 25 MG tablet  Commonly known as:  ZOLOFT  Take 25 mg by mouth daily.           Follow-up Information   Please follow up. (primary provider in 2 weeks )        The results of significant diagnostics from this hospitalization (including imaging, microbiology, ancillary and laboratory) are listed below for reference.    Significant Diagnostic Studies: Dg Chest 2 View  07/15/2013   CLINICAL DATA:  Left-sided chest pain and neck pain. Shortness of breath.  EXAM: CHEST  2 VIEW  COMPARISON:  CHEST x-ray 01/15/2011.  FINDINGS: Lung volumes are normal. No consolidative airspace disease. No pleural effusions. Mild diffuse peribronchial cuffing. No evidence of pulmonary edema. Heart size is normal. Large hiatal hernia. Upper mediastinal contours are within normal limits. Atherosclerosis in the thoracic aorta. Extensive bilateral apical pleural parenchymal thickening, partially calcified, most compatible with chronic post infectious or inflammatory scarring.  IMPRESSION: 1. Mild diffuse peribronchial cuffing may suggest acute or chronic bronchitis. Clinical correlation is recommended. 2. Atherosclerosis. 3. Large hiatal hernia.   Electronically Signed   By: Trudie Reed M.D.   On: 07/15/2013 18:40    Microbiology: No results found for this or any previous visit (from the past 240 hour(s)).   Labs: Basic Metabolic Panel:  Recent Labs Lab 07/15/13 1759 07/16/13 0354  NA 136 139  K 4.0 3.5  CL 102 105  CO2 26 26  GLUCOSE 94 93  BUN 11 12  CREATININE 0.83 0.96  CALCIUM 8.8 8.6   Liver Function  Tests: No results found for this basename: AST, ALT, ALKPHOS, BILITOT, PROT, ALBUMIN,  in the last 168 hours No results found for this basename: LIPASE, AMYLASE,  in the last 168 hours No results found for this basename: AMMONIA,  in the last 168 hours CBC:  Recent Labs Lab 07/15/13 1759 07/16/13 0354  WBC 5.2 4.7  HGB 14.4 14.4  HCT 41.1 42.4  MCV 94.3 95.1  PLT 241 253   Cardiac Enzymes:  Recent Labs Lab 07/15/13 2208 07/16/13 0354 07/16/13 0926  TROPONINI <0.30 <0.30 <0.30   BNP: BNP (last 3 results) No results found for this basename: PROBNP,  in the last 8760 hours CBG: No results found for this basename: GLUCAP,  in the last 168 hours  Time coordinating discharge: Over 30 minutes

## 2013-07-16 NOTE — Progress Notes (Signed)
Patient discharge home with grand-daughter, discharge instructions given and explained to patient and he verbalized understanding. PAtient denies any pain/distress. Skin intact, no wound. Accompanied home by Grand-daughter, transported to the car via wheelchair.

## 2013-07-20 ENCOUNTER — Inpatient Hospital Stay: Payer: PRIVATE HEALTH INSURANCE

## 2013-08-04 ENCOUNTER — Encounter (HOSPITAL_COMMUNITY): Payer: Self-pay | Admitting: Emergency Medicine

## 2013-08-04 ENCOUNTER — Emergency Department (HOSPITAL_COMMUNITY): Payer: PRIVATE HEALTH INSURANCE

## 2013-08-04 ENCOUNTER — Emergency Department (HOSPITAL_COMMUNITY)
Admission: EM | Admit: 2013-08-04 | Discharge: 2013-08-04 | Disposition: A | Discharge status: Home / Self Care | Payer: PRIVATE HEALTH INSURANCE | Attending: Emergency Medicine | Admitting: Emergency Medicine

## 2013-08-04 DIAGNOSIS — Z79899 Other long term (current) drug therapy: Secondary | ICD-10-CM | POA: Insufficient documentation

## 2013-08-04 DIAGNOSIS — R0602 Shortness of breath: Secondary | ICD-10-CM | POA: Insufficient documentation

## 2013-08-04 DIAGNOSIS — M25512 Pain in left shoulder: Secondary | ICD-10-CM

## 2013-08-04 DIAGNOSIS — R5381 Other malaise: Secondary | ICD-10-CM | POA: Insufficient documentation

## 2013-08-04 DIAGNOSIS — Z8659 Personal history of other mental and behavioral disorders: Secondary | ICD-10-CM | POA: Insufficient documentation

## 2013-08-04 DIAGNOSIS — M25519 Pain in unspecified shoulder: Secondary | ICD-10-CM | POA: Insufficient documentation

## 2013-08-04 DIAGNOSIS — Z87891 Personal history of nicotine dependence: Secondary | ICD-10-CM | POA: Insufficient documentation

## 2013-08-04 DIAGNOSIS — Z8673 Personal history of transient ischemic attack (TIA), and cerebral infarction without residual deficits: Secondary | ICD-10-CM | POA: Insufficient documentation

## 2013-08-04 DIAGNOSIS — Z8709 Personal history of other diseases of the respiratory system: Secondary | ICD-10-CM | POA: Insufficient documentation

## 2013-08-04 DIAGNOSIS — Z8669 Personal history of other diseases of the nervous system and sense organs: Secondary | ICD-10-CM | POA: Insufficient documentation

## 2013-08-04 DIAGNOSIS — Z8546 Personal history of malignant neoplasm of prostate: Secondary | ICD-10-CM | POA: Insufficient documentation

## 2013-08-04 DIAGNOSIS — E860 Dehydration: Secondary | ICD-10-CM | POA: Insufficient documentation

## 2013-08-04 LAB — CBC WITH DIFFERENTIAL/PLATELET
Basophils Absolute: 0.1 10*3/uL (ref 0.0–0.1)
Basophils Relative: 1 % (ref 0–1)
Hemoglobin: 14.4 g/dL (ref 13.0–17.0)
MCHC: 34.1 g/dL (ref 30.0–36.0)
Monocytes Relative: 10 % (ref 3–12)
Neutro Abs: 4 10*3/uL (ref 1.7–7.7)
Neutrophils Relative %: 69 % (ref 43–77)
RDW: 14.2 % (ref 11.5–15.5)

## 2013-08-04 LAB — COMPREHENSIVE METABOLIC PANEL
ALT: 14 U/L (ref 0–53)
Calcium: 8.9 mg/dL (ref 8.4–10.5)
Chloride: 103 mEq/L (ref 96–112)
GFR calc Af Amer: >90 mL/min (ref >90)
GFR calc non Af Amer: 82 mL/min — ABNORMAL LOW (ref >90)
Potassium: 4.3 mEq/L (ref 3.5–5.1)
Total Protein: 6.6 g/dL (ref 6.0–8.3)

## 2013-08-04 LAB — URINALYSIS, ROUTINE W REFLEX MICROSCOPIC
Bilirubin Urine: NEGATIVE
Leukocytes, UA: NEGATIVE
Nitrite: NEGATIVE
Specific Gravity, Urine: 1.02 (ref 1.005–1.030)
Urobilinogen, UA: 0.2 mg/dL (ref 0.0–1.0)
pH: 5 (ref 5.0–8.0)

## 2013-08-04 LAB — POCT I-STAT TROPONIN I: Troponin i, poc: 0 ng/mL (ref 0.00–0.08)

## 2013-08-04 MED ORDER — SODIUM CHLORIDE 0.9 % IV BOLUS (SEPSIS)
1000.0000 mL | Freq: Once | INTRAVENOUS | Status: AC
  Administered 2013-08-04: 1000 mL via INTRAVENOUS

## 2013-08-04 NOTE — ED Notes (Signed)
Patient presents today with a chief complaint of chest pain that began this morning with a "burning" sensation radiating down left arm.  Patient also reporting generalized weakness and intermittent SOB. Denis pain at this time. Patient reports he was brought in by EMS, no EMS at bedside

## 2013-08-04 NOTE — ED Notes (Signed)
Pt return from XR.

## 2013-08-04 NOTE — ED Notes (Signed)
MD at bedside. 

## 2013-08-04 NOTE — ED Notes (Signed)
Pt undressed, in gown, on monitor, continuous pulse oximetry and blood pressure cuff 

## 2013-08-04 NOTE — ED Notes (Signed)
Pt handed an urinal to attempt to provide an urine specimen

## 2013-08-04 NOTE — ED Notes (Signed)
Patient transported to X-ray 

## 2013-08-04 NOTE — ED Provider Notes (Signed)
CSN: [id number]     Arrival date & time 08/04/13  1444 History   First MD Initiated Contact with Patient 08/04/13 1523     Chief Complaint  Patient presents with  . Chest Pain   (Consider location/radiation/quality/duration/timing/severity/associated sxs/prior Treatment) HPI Comments: 79 y/o male with history of prostate adenocarcinoma, dementia presenting with intermittent SOB and left shoulder pain. Denies current shortness of breath. Pain described as burning. He reports he was seen for chest pain and left shoulder pain 2 weeks ago with negative cardiac workup. History is limited by patients ability to describe symptoms and time of onset. Denies fever, productive cough, diaphoresis, palpitations, LE swelling. [facility] reports chest pain which patient specifically denies. No recent injury to left arm.   The history is provided by the patient. No language interpreter was used.    Past Medical History  Diagnosis Date  . Arthritis   . Cancer     prostate   . Stroke   . 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 Nasal congestion   . Hearing loss   . Difficulty urinating   . Weakness   . Dementia    Past Surgical History  Procedure Laterality Date  . Hernia repair  2012   No family history on file. History  Substance Use Topics  . Smoking status: Former Games developer  . Smokeless tobacco: Not on file  . Alcohol Use: No    Review of Systems  Constitutional: Positive for fatigue. Negative for fever.  Respiratory: Positive for shortness of breath. Negative for cough and wheezing.   Cardiovascular: Negative for chest pain, palpitations and leg swelling.  Gastrointestinal: Negative for nausea, vomiting, abdominal pain and abdominal distention.  Musculoskeletal: Positive for arthralgias. Negative for back pain, joint swelling and neck pain.  Neurological: Negative for light-headedness and numbness.  All other systems reviewed and are negative.    Allergies  Review of  patient's allergies indicates no known allergies.  Home Medications   Current Outpatient Rx  Name  Route  Sig  Dispense  Refill  . donepezil (ARICEPT) 10 MG tablet   Oral   Take 10 mg by mouth at bedtime.         . eszopiclone (LUNESTA) 1 MG TABS   Oral   Take 1 mg by mouth at bedtime. Take immediately before bedtime          . Multiple Vitamin (MULTIVITAMIN WITH MINERALS) TABS tablet   Oral   Take 1 tablet by mouth daily.         . sertraline (ZOLOFT) 25 MG tablet   Oral   Take 25 mg by mouth daily.          BP 114/91  Pulse 97  Temp(Src) 98.2 F (36.8 C) (Oral)  Resp 22  SpO2 94% Physical Exam  Vitals reviewed. Constitutional: No distress.  HENT:  Head: Atraumatic.  Eyes:  Left pupil not round or reactive  Neck: Neck supple. No JVD present. No tracheal deviation present.  Cardiovascular: Normal rate, regular rhythm, normal heart sounds and intact distal pulses.   Pulmonary/Chest: Effort normal and breath sounds normal. He has no wheezes. He has no rales.  Abdominal: Soft. Bowel sounds are normal. He exhibits no distension. There is no tenderness.  Musculoskeletal:       Left shoulder: He exhibits bony tenderness. He exhibits normal range of motion, no swelling and no deformity.       Right lower leg: He  exhibits no swelling.       Left lower leg: He exhibits no swelling.  Neurological: He is alert. He has normal strength and normal reflexes. No cranial nerve deficit or sensory deficit. Coordination and gait normal.  Oriented to person, place, and situation.   Skin: Skin is warm and dry. He is not diaphoretic.    ED Course  Procedures (including critical care time) Labs Review Labs Reviewed  COMPREHENSIVE METABOLIC PANEL - Abnormal; Notable for the following:    GFR calc non Af Amer 82 (*)    All other components within normal limits  URINALYSIS, ROUTINE W REFLEX MICROSCOPIC - Abnormal; Notable for the following:    Ketones, ur 15 (*)    All other  components within normal limits  CBC WITH DIFFERENTIAL  POCT I-STAT TROPONIN I   Imaging Review Dg Chest 2 View  08/04/2013   CLINICAL DATA:  Chest pain and shortness of breath  EXAM: CHEST  2 VIEW  COMPARISON:  07/15/2013  FINDINGS: The heart size and mediastinal contours are within normal limits. Large hiatal hernia is again noted. Biapical scarring noted. The lungs are hyperinflated and there are coarsened interstitial markings noted bilaterally. The visualized skeletal structures are unremarkable.  IMPRESSION: 1. No acute findings.  2. Hiatal hernia.   Electronically Signed   By: Signa Kell M.D.   On: 08/04/2013 16:21    MDM   1. Dehydration   2. Nontraumatic shoulder pain, left     Date: 08/04/2013  Rate: 96  Rhythm: normal sinus rhythm  QRS Axis: normal  Intervals: normal  ST/T Wave abnormalities: normal  Conduction Disutrbances:incomplete RBBB, LAFB  Narrative Interpretation: sinus rhythm, no ischemic changes, unchanged from prior  Old EKG Reviewed: unchanged   79 y/o male with history of alzheimer's disease presenting with left shoulder pain. Seen for chest pain and same shoulder pain 2 weeks ago and admitted for serial cardiac enzymes which were negative. No recent change in pain or new injury. Denies chest pain or SOB although he states that he has intermittent shortness of breath at baseline. No recent infectious symptoms: cough, fever, sputum. Afebrile, vitals stable. No distress on exam. Reports burning of left arm over the neck of the humerus. Used pain cream which relieved pain. No deformity noted. Full ROM with intact radial pulses. Lungs clear, no LE swelling or tenderness.   Labs remarkable for small ketones in urine. Otherwise unremarkable. Troponin negative. Fluid bolus given and he reports feeling well. Based on history and presentation, doubt ACS, PNA, PE, dissection, pneumothorax. Appropriate for discharge with PCP follow up.   Labs and imaging reviewed in my  medical decision making if ordered. Patient discussed with my attending, Dr. Doreatha Martin, MD 08/05/13 [phone]

## 2013-08-05 NOTE — ED Provider Notes (Signed)
I saw and evaluated the patient, reviewed the resident's note and I agree with the findings and plan. I have reviewed EKG and agree with the resident interpretation.  you Pt with vague arm pain better with OTC meds and muscle creams who presents due to not eating.  Denies N/V, chest pain, sob, diarrhea or abd pain.  Pt does drink alcohol and reports taking no meds.  Well appearing here and normal VS. EKG wnl and labs normal except for mild dehydration.  Pt feeling much better after fluids and d/ced home.  Gwyneth Sprout, MD 08/05/13 2253

## 2015-05-22 ENCOUNTER — Emergency Department (HOSPITAL_COMMUNITY): Admission: EM | Admit: 2015-05-22 | Discharge: 2015-05-22 | Discharge status: Absent without leave | Payer: Self-pay

## 2015-05-31 DIAGNOSIS — Z9889 Other specified postprocedural states: Secondary | ICD-10-CM | POA: Insufficient documentation

## 2015-05-31 DIAGNOSIS — W1839XA Other fall on same level, initial encounter: Secondary | ICD-10-CM | POA: Insufficient documentation

## 2015-05-31 DIAGNOSIS — G4700 Insomnia, unspecified: Secondary | ICD-10-CM | POA: Diagnosis not present

## 2015-05-31 DIAGNOSIS — M1990 Unspecified osteoarthritis, unspecified site: Secondary | ICD-10-CM | POA: Insufficient documentation

## 2015-05-31 DIAGNOSIS — Y939 Activity, unspecified: Secondary | ICD-10-CM | POA: Diagnosis not present

## 2015-05-31 DIAGNOSIS — Z720 Tobacco use: Secondary | ICD-10-CM | POA: Diagnosis not present

## 2015-05-31 DIAGNOSIS — S3991XA Unspecified injury of abdomen, initial encounter: Secondary | ICD-10-CM | POA: Insufficient documentation

## 2015-05-31 DIAGNOSIS — R110 Nausea: Secondary | ICD-10-CM | POA: Diagnosis not present

## 2015-05-31 DIAGNOSIS — Y929 Unspecified place or not applicable: Secondary | ICD-10-CM | POA: Insufficient documentation

## 2015-05-31 DIAGNOSIS — R3919 Other difficulties with micturition: Secondary | ICD-10-CM | POA: Diagnosis not present

## 2015-05-31 DIAGNOSIS — Y999 Unspecified external cause status: Secondary | ICD-10-CM | POA: Diagnosis not present

## 2015-05-31 DIAGNOSIS — Z8546 Personal history of malignant neoplasm of prostate: Secondary | ICD-10-CM | POA: Diagnosis not present

## 2015-05-31 DIAGNOSIS — Z79899 Other long term (current) drug therapy: Secondary | ICD-10-CM | POA: Diagnosis not present

## 2015-05-31 DIAGNOSIS — F0390 Unspecified dementia without behavioral disturbance: Secondary | ICD-10-CM | POA: Diagnosis not present

## 2015-06-01 ENCOUNTER — Emergency Department (HOSPITAL_COMMUNITY): Payer: Medicare HMO

## 2015-06-01 ENCOUNTER — Encounter (HOSPITAL_COMMUNITY): Payer: Self-pay | Admitting: Emergency Medicine

## 2015-06-01 ENCOUNTER — Emergency Department (HOSPITAL_COMMUNITY)
Admission: EM | Admit: 2015-06-01 | Discharge: 2015-06-01 | Disposition: A | Discharge status: Home / Self Care | Payer: Medicare HMO | Attending: Emergency Medicine | Admitting: Emergency Medicine

## 2015-06-01 DIAGNOSIS — R296 Repeated falls: Secondary | ICD-10-CM

## 2015-06-01 DIAGNOSIS — F0390 Unspecified dementia without behavioral disturbance: Secondary | ICD-10-CM

## 2015-06-01 DIAGNOSIS — R1032 Left lower quadrant pain: Secondary | ICD-10-CM

## 2015-06-01 LAB — COMPREHENSIVE METABOLIC PANEL
ALT: 9 U/L — ABNORMAL LOW (ref 17–63)
AST: 23 U/L (ref 15–41)
Albumin: 4.2 g/dL (ref 3.5–5.0)
Alkaline Phosphatase: 61 U/L (ref 38–126)
Anion gap: 8 (ref 5–15)
BUN: 13 mg/dL (ref 6–20)
CALCIUM: 9.2 mg/dL (ref 8.9–10.3)
CHLORIDE: 105 mmol/L (ref 101–111)
CO2: 24 mmol/L (ref 22–32)
CREATININE: 0.85 mg/dL (ref 0.61–1.24)
GFR calc Af Amer: >60 mL/min (ref >60)
Glucose, Bld: 96 mg/dL (ref 65–99)
POTASSIUM: 3.9 mmol/L (ref 3.5–5.1)
Sodium: 137 mmol/L (ref 135–145)
Total Bilirubin: 0.9 mg/dL (ref 0.3–1.2)
Total Protein: 7.4 g/dL (ref 6.5–8.1)

## 2015-06-01 LAB — CBC WITH DIFFERENTIAL/PLATELET
Basophils Absolute: 0.1 10*3/uL (ref 0.0–0.1)
Basophils Relative: 1 % (ref 0–1)
EOS ABS: 0.2 10*3/uL (ref 0.0–0.7)
Eosinophils Relative: 4 % (ref 0–5)
HCT: 43.8 % (ref 39.0–52.0)
HEMOGLOBIN: 15.6 g/dL (ref 13.0–17.0)
Lymphocytes Relative: 24 % (ref 12–46)
Lymphs Abs: 1.6 10*3/uL (ref 0.7–4.0)
MCH: 34.9 pg — AB (ref 26.0–34.0)
MCHC: 35.6 g/dL (ref 30.0–36.0)
MCV: 98 fL (ref 78.0–100.0)
Monocytes Absolute: 0.6 10*3/uL (ref 0.1–1.0)
Monocytes Relative: 9 % (ref 3–12)
Neutro Abs: 4.2 10*3/uL (ref 1.7–7.7)
Neutrophils Relative %: 62 % (ref 43–77)
PLATELETS: 245 10*3/uL (ref 150–400)
RBC: 4.47 MIL/uL (ref 4.22–5.81)
RDW: 12.9 % (ref 11.5–15.5)
WBC: 6.7 10*3/uL (ref 4.0–10.5)

## 2015-06-01 LAB — URINALYSIS, ROUTINE W REFLEX MICROSCOPIC
Bilirubin Urine: NEGATIVE
Glucose, UA: NEGATIVE mg/dL
Hgb urine dipstick: NEGATIVE
Ketones, ur: NEGATIVE mg/dL
LEUKOCYTES UA: NEGATIVE
Nitrite: NEGATIVE
PROTEIN: NEGATIVE mg/dL
Specific Gravity, Urine: 1.012 (ref 1.005–1.030)
Urobilinogen, UA: 0.2 mg/dL (ref 0.0–1.0)
pH: 6 (ref 5.0–8.0)

## 2015-06-01 MED ORDER — SERTRALINE HCL 25 MG PO TABS: 25.0000 mg | ORAL_TABLET | Freq: Every day | ORAL | Status: DC

## 2015-06-01 MED ORDER — IOHEXOL 300 MG/ML  SOLN
100.0000 mL | Freq: Once | INTRAMUSCULAR | Status: AC | PRN
  Administered 2015-06-01: 100 mL via INTRAVENOUS

## 2015-06-01 MED ORDER — TRAMADOL HCL 50 MG PO TABS
50.0000 mg | ORAL_TABLET | Freq: Four times a day (QID) | ORAL | Status: DC | PRN
  Administered 2015-06-01 (×2): 50 mg via ORAL
  Filled 2015-06-01 (×2): qty 1

## 2015-06-01 MED ORDER — FENTANYL CITRATE (PF) 100 MCG/2ML IJ SOLN
50.0000 ug | Freq: Once | INTRAMUSCULAR | Status: AC
  Administered 2015-06-01: 50 ug via INTRAVENOUS
  Filled 2015-06-01: qty 2

## 2015-06-01 MED ORDER — DONEPEZIL HCL 10 MG PO TABS: 10.0000 mg | ORAL_TABLET | Freq: Every day | ORAL | Status: DC

## 2015-06-01 MED ORDER — IOHEXOL 300 MG/ML  SOLN
50.0000 mL | Freq: Once | INTRAMUSCULAR | Status: AC | PRN
  Administered 2015-06-01: 50 mL via ORAL

## 2015-06-01 MED ORDER — DOCUSATE SODIUM 100 MG PO CAPS: 100.0000 mg | ORAL_CAPSULE | Freq: Two times a day (BID) | ORAL | Status: DC | PRN

## 2015-06-01 NOTE — ED Notes (Signed)
Pt and caregiver made aware of need for urine sample. Urinal given.
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
## 2015-06-01 NOTE — ED Notes (Signed)
Pt health care worker has returned to bedside. States pt requesting pain medication. MD made aware.

## 2015-06-01 NOTE — Progress Notes (Addendum)
1525 provided a list of [place] oncologist to assist with prostate issues Updated Cyril Mourning of advance on number changes and available doctors for any possible further [facility] orders afater leaving [facility]   1522 spoke with ashley at Dr Jeanie Cooks office [phone] who states he would assist pt if needed but has not seen him since march 2014 Caryl Pina confirms Dr Jeanie Cooks would need to see pt prior to assisting with further home health orders after pt leaves [facility] ED  1459 spoke with shanna at Dr Elna Breslow office to inform of ED visit and orders initiated for home health and discussed possible assistance with further home health orders after pt leaves [facility] ED  1456 sent Toniann Ket [phone] an EPIC in basket message about pt ED visit and made aware of initiation of [facility] services Left Cm # for return call [phone] spoke with Dr Colin Rhein about request for rx for pt until seen by new pcp. [place] reviewed pt rxs lunesta is 3 mg Dr Davonna Belling

## 2015-06-01 NOTE — ED Notes (Addendum)
[name], home health worker who states she has POA, may be reached at [phone]. She will be leaving bedside.

## 2015-06-01 NOTE — ED Notes (Signed)
Per caregiver pt has dementia  Pt has been c/o pain to his abdomen where he had a hernia repair  Pt has been dizzy, nauseated, falling, not sleeping  Pt has been out of his sleeping medication for a while now

## 2015-06-01 NOTE — Progress Notes (Signed)
Cm received consult from ED SW. Cm reviewed EPIC chart review, labs, previous EPIC notes Cm noted previous [facility] services and new schedule appt with Dr Mauricio Po for 07/03/15 at 1500 in EPIC  Cm attempted to reach guardian at 64 without success  Family conference schedule for 11am  CM spoke with [facility] about pt and possible referral for services at 22 Pt had [facility] in 06/2013  Cm went by pt room 1130 and guardian is not present Pt state he was informed she would be back soon

## 2015-06-01 NOTE — ED Notes (Signed)
Pt to CT
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
## 2015-06-01 NOTE — ED Notes (Signed)
CT tech at bedside with oral contrast.

## 2015-06-01 NOTE — Discharge Instructions (Signed)
Abdominal Pain Many things can cause abdominal pain. Usually, abdominal pain is not caused by a disease and will improve without treatment. It can often be observed and treated at home. Your health care provider will do a physical exam and possibly order blood tests and X-rays to help determine the seriousness of your pain. However, in many cases, more time must pass before a clear cause of the pain can be found. Before that point, your health care provider may not know if you need more testing or further treatment. HOME CARE INSTRUCTIONS  Monitor your abdominal pain for any changes. The following actions may help to alleviate any discomfort you are experiencing:  Only take over-the-counter or prescription medicines as directed by your health care provider.  Do not take laxatives unless directed to do so by your health care provider.  Try a clear liquid diet (broth, tea, or water) as directed by your health care provider. Slowly move to a bland diet as tolerated. SEEK MEDICAL CARE IF:  You have unexplained abdominal pain.  You have abdominal pain associated with nausea or diarrhea.  You have pain when you urinate or have a bowel movement.  You experience abdominal pain that wakes you in the night.  You have abdominal pain that is worsened or improved by eating food.  You have abdominal pain that is worsened with eating fatty foods.  You have a fever. SEEK IMMEDIATE MEDICAL CARE IF:   Your pain does not go away within 2 hours.  You keep throwing up (vomiting).  Your pain is felt only in portions of the abdomen, such as the right side or the left lower portion of the abdomen.  You pass bloody or black tarry stools. MAKE SURE YOU:  Understand these instructions.   Will watch your condition.   Will get help right away if you are not doing well or get worse.  Document Released: 07/24/2005 Document Revised: 10/19/2013 Document Reviewed: 06/23/2013 [facility] Patient Information  2015 [place], [place]. This information is not intended to replace advice given to you by your health care provider. Make sure you discuss any questions you have with your health care provider. Prostate Cancer  Prostate cancer is the abnormal growth of cells in your prostate gland. Your prostate gland is involved in the production of semen. It is located below your bladder and in front of your rectum. A normal prostate gland is the size of a walnut and surrounds the tube that carries urine from the bladder (urethra).  RISK FACTORS  Age older than 20 years.  African American race.  Obesity.  Family history of prostate cancer.  Family history of breast cancer. SIGNS AND SYMPTOMS   Frequent urination.  Weak or interrupted flow of urine.  Difficulty starting or stopping urination.  Inability to urinate.  Painful or burning urination.  Painful ejaculation.  Blood in urine or semen.  Persistent pain or discomfort in the lower back, lower abdomen, hips, or upper thighs.  Difficulty getting an erection.  Difficulty emptying your bladder completely. DIAGNOSIS  Prostate cancer can be diagnosed by a digital rectal exam, prostate-specific antigen (PSA) blood test, transrectal ultrasonography, and then a biopsy to test a tissue sample. Usually 8-12 samples are taken. The tissue is sent to a specialist who looks at tissues and cells (pathologist). If cancer is diagnosed, the next step is to stage the cancer. This means it is put in a category based on how far the cancer has spread. This is important for helping your  health care providers plan appropriate treatment. The different stages of prostate cancer are as follows:  Stage I. Cancer is found in the prostate only. It cannot be felt during a digital rectal exam and is not visible by imaging. It is usually found accidentally, such as during surgery for other prostate problems.  Stage II. Cancer is more advanced than in stage I but has not  spread outside the prostate.  Stage III. Cancer has spread beyond the outer layer of the prostate to nearby tissues. Cancer may be found in the seminal vesicles.  Stage IV. Cancer has spread to lymph nodes or to other parts of the body. The cancer may have spread to the bladder, rectum, bones, liver, or lungs. Prostate cancer often spreads to the bones. Imaging scans, such as a bone scan, CT, PET, or MRI, are done to help in the staging process. TREATMENT  Treatments such as surgery, medicines, and radiation may be recommended based on the stage of the cancer and other factors. Once cancer of the prostate has been diagnosed, your health care provider will discuss your treatment with you. Your health care provider will help you decide on the best course of treatment. Treatment often depends on your age, health, and other risk factors. The more common methods of treatment are:  Observation for early stage prostate cancer.  Open surgery. This involves a surgery to remove the prostate.  Laparoscopic prostatectomy to remove the prostate and lymph nodes.  Robotic prostatectomy to remove the prostate and lymph nodes.  External beam radiation, which aims beams of radiation from outside the body at the prostate.  Internal radiation (brachytherapy), which uses radioactive needles, 40-100 pellets (seeds), wires, or catheters implanted directly into the prostate gland.  High-intensity, focused ultrasonography to destroy cancer cells.  Cryosurgery to freeze and destroy prostate cancer cells.  Chemotherapy medicines to stop the growth of cancer cells either by killing them or by stopping them from multiplying.  Hormone treatment. Medicines that stop your body from producing testosterone, or medicines that block testosterone from reaching cancer cells.  Orchiectomy. This is surgery to remove your testicles. HOME CARE INSTRUCTIONS  Take medicines only as directed by your health care  provider.  Maintain a healthy diet.  Get plenty of sleep.  Consider joining a support group. This may help you learn to cope with the stress of having prostate cancer.  Seek advice to help you manage treatment side effects.  Keep all follow-up visits as directed by your health care provider.  Inform your cancer specialist if you are admitted to the hospital.  Continue sexual expression. If you experience erectile dysfunction, your natural reaction will be to pull away and avoid all sexual contact. Consider touching, holding, hugging, and caressing as ways to continue sharing sexuality with your partner. SEEK MEDICAL CARE IF:  You have trouble urinating.  You have blood in your urine.  You have trouble getting an erection.  You have pain in your hips, back, or chest. SEEK IMMEDIATE MEDICAL CARE IF:  You have weakness or numbness in your legs.  You have involuntary loss of urine or stool (incontinence). Document Released: 10/14/2005 Document Revised: 02/28/2014 Document Reviewed: 04/02/2013 [facility] Patient Information 2015 [place], [place]. This information is not intended to replace advice given to you by your health care provider. Make sure you discuss any questions you have with your health care provider.
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
## 2015-06-01 NOTE — ED Notes (Signed)
Pt health care worker requesting a notary "because I need to be able to pay his bills on his behalf, or they are going to cut the lights off today." Pt and health care worker made aware of social work and case management consult placed for later in the morning.

## 2015-06-01 NOTE — ED Notes (Signed)
Pt returned from CT °
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
## 2015-06-01 NOTE — ED Notes (Signed)
MD at bedside speaking to pt and health care worker at this time.

## 2015-06-01 NOTE — Progress Notes (Addendum)
CSW received consult to assess patient current living situation and assess any potential needs. There is concern regarding patient living alone, an RN who comes to assist 3x per week, and concern regarding patient utilities and paying bills. CSW went to the room, patient sleeping soundly. Patient with history of dementia. Patient caregiver expected to return per charge nurse. No identifying information for caregiver in chart. CSW called [name] listed on face sheet and left message requesting return call.   Belia Heman, LCSW  Clinical Social Work  [employer] [phone]    Addendum: CSW found number for pt caregiver, [name] [phone]. CSW called and spoke with Ms. Edwards. Per Ms. Edwards shared that patient would benefit from further home health services. Ms. Oletta Lamas states she will be coming back after 10. CSW and pt caregiver to meet at 11am in pt room.   Belia Heman, [place] Work  [employer] [phone]

## 2015-06-01 NOTE — Progress Notes (Signed)
Caregiver still not present at bedside. CSW called caregiver, who was still sleeping stating she will be here at 1230.  Pt now awake, csw completed assessment. Pt shares that he lives at home alone, and has had increased weakness and falls. Patient shares that his caregiver, [name] comes in 2-3x per week. Pt states he feels safe returning home and is interested in home health services for physical therapy, occupational therapy, rn, and social work. Patient shares that he has known courtney for 12 years and she has always helped by bringing groceries and helping to pay bills. Pt states, "I don't have checks, I just get cash from my social security and she goes to the places and pays them. Pt alert to date 06/01/2015 and states he got his check yesterday. Pt oriented to President, situation-came to hospital for weakness, and knows he has [place].   Belia Heman, [place] Work  [employer] [phone]
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
## 2015-06-06 NOTE — Progress Notes (Signed)
Cm followed up with [name] to see if pt was doing well  [name] states pt is doing very well and she has all the resources needed CM again left her office contact number for pt and courtney prn

## 2015-07-03 ENCOUNTER — Ambulatory Visit: Payer: Self-pay | Admitting: Family

## 2015-07-04 ENCOUNTER — Ambulatory Visit: Payer: Medicare HMO | Admitting: Family

## 2015-07-25 ENCOUNTER — Encounter (HOSPITAL_COMMUNITY): Payer: Self-pay | Admitting: Emergency Medicine

## 2015-07-25 ENCOUNTER — Emergency Department (HOSPITAL_COMMUNITY): Payer: Medicare HMO

## 2015-07-25 ENCOUNTER — Observation Stay (HOSPITAL_COMMUNITY)
Admission: EM | Admit: 2015-07-25 | Discharge: 2015-07-26 | Disposition: A | Discharge status: Home / Self Care | Payer: Medicare HMO | Attending: Internal Medicine | Admitting: Internal Medicine

## 2015-07-25 DIAGNOSIS — Z23 Encounter for immunization: Secondary | ICD-10-CM | POA: Insufficient documentation

## 2015-07-25 DIAGNOSIS — Z8673 Personal history of transient ischemic attack (TIA), and cerebral infarction without residual deficits: Secondary | ICD-10-CM | POA: Diagnosis not present

## 2015-07-25 DIAGNOSIS — I4510 Unspecified right bundle-branch block: Secondary | ICD-10-CM | POA: Diagnosis not present

## 2015-07-25 DIAGNOSIS — M1990 Unspecified osteoarthritis, unspecified site: Secondary | ICD-10-CM | POA: Diagnosis not present

## 2015-07-25 DIAGNOSIS — G309 Alzheimer's disease, unspecified: Secondary | ICD-10-CM | POA: Diagnosis not present

## 2015-07-25 DIAGNOSIS — Z79899 Other long term (current) drug therapy: Secondary | ICD-10-CM | POA: Diagnosis not present

## 2015-07-25 DIAGNOSIS — R079 Chest pain, unspecified: Secondary | ICD-10-CM | POA: Diagnosis not present

## 2015-07-25 DIAGNOSIS — C61 Malignant neoplasm of prostate: Secondary | ICD-10-CM

## 2015-07-25 DIAGNOSIS — F0280 Dementia in other diseases classified elsewhere without behavioral disturbance: Secondary | ICD-10-CM | POA: Insufficient documentation

## 2015-07-25 DIAGNOSIS — Z8546 Personal history of malignant neoplasm of prostate: Secondary | ICD-10-CM | POA: Insufficient documentation

## 2015-07-25 DIAGNOSIS — E119 Type 2 diabetes mellitus without complications: Secondary | ICD-10-CM | POA: Insufficient documentation

## 2015-07-25 DIAGNOSIS — Z791 Long term (current) use of non-steroidal anti-inflammatories (NSAID): Secondary | ICD-10-CM | POA: Diagnosis not present

## 2015-07-25 DIAGNOSIS — G4700 Insomnia, unspecified: Secondary | ICD-10-CM | POA: Diagnosis not present

## 2015-07-25 DIAGNOSIS — M25511 Pain in right shoulder: Secondary | ICD-10-CM | POA: Diagnosis present

## 2015-07-25 DIAGNOSIS — M25512 Pain in left shoulder: Secondary | ICD-10-CM | POA: Insufficient documentation

## 2015-07-25 DIAGNOSIS — F17200 Nicotine dependence, unspecified, uncomplicated: Secondary | ICD-10-CM | POA: Diagnosis not present

## 2015-07-25 HISTORY — DX: Type 2 diabetes mellitus without complications: E11.9

## 2015-07-25 LAB — CBC WITH DIFFERENTIAL/PLATELET
Basophils Absolute: 0.1 10*3/uL (ref 0.0–0.1)
Basophils Relative: 1 %
EOS PCT: 8 %
Eosinophils Absolute: 0.4 10*3/uL (ref 0.0–0.7)
HCT: 43.5 % (ref 39.0–52.0)
Hemoglobin: 15 g/dL (ref 13.0–17.0)
LYMPHS ABS: 1.6 10*3/uL (ref 0.7–4.0)
Lymphocytes Relative: 31 %
MCH: 33.4 pg (ref 26.0–34.0)
MCHC: 34.5 g/dL (ref 30.0–36.0)
MCV: 96.9 fL (ref 78.0–100.0)
MONO ABS: 0.5 10*3/uL (ref 0.1–1.0)
Monocytes Relative: 9 %
Neutro Abs: 2.7 10*3/uL (ref 1.7–7.7)
Neutrophils Relative %: 51 %
PLATELETS: 225 10*3/uL (ref 150–400)
RBC: 4.49 MIL/uL (ref 4.22–5.81)
RDW: 13.7 % (ref 11.5–15.5)
WBC: 5.2 10*3/uL (ref 4.0–10.5)

## 2015-07-25 LAB — I-STAT TROPONIN, ED: Troponin i, poc: 0.01 ng/mL (ref 0.00–0.08)

## 2015-07-25 LAB — COMPREHENSIVE METABOLIC PANEL
ALT: 5 U/L — AB (ref 17–63)
ANION GAP: 5 (ref 5–15)
AST: 28 U/L (ref 15–41)
Albumin: 4.1 g/dL (ref 3.5–5.0)
Alkaline Phosphatase: 59 U/L (ref 38–126)
BUN: 19 mg/dL (ref 6–20)
CHLORIDE: 105 mmol/L (ref 101–111)
CO2: 27 mmol/L (ref 22–32)
CREATININE: 1 mg/dL (ref 0.61–1.24)
Calcium: 9.6 mg/dL (ref 8.9–10.3)
Glucose, Bld: 93 mg/dL (ref 65–99)
Potassium: 5.1 mmol/L (ref 3.5–5.1)
SODIUM: 137 mmol/L (ref 135–145)
Total Bilirubin: 0.4 mg/dL (ref 0.3–1.2)
Total Protein: 7.1 g/dL (ref 6.5–8.1)

## 2015-07-25 LAB — TROPONIN I

## 2015-07-25 LAB — MAGNESIUM: Magnesium: 2.3 mg/dL (ref 1.7–2.4)

## 2015-07-25 LAB — TSH: TSH: 1.054 u[IU]/mL (ref 0.350–4.500)

## 2015-07-25 LAB — BRAIN NATRIURETIC PEPTIDE: B Natriuretic Peptide: 65.6 pg/mL (ref 0.0–100.0)

## 2015-07-25 MED ORDER — ASPIRIN 81 MG PO CHEW
324.0000 mg | CHEWABLE_TABLET | Freq: Once | ORAL | Status: AC
  Administered 2015-07-25: 324 mg via ORAL
  Filled 2015-07-25: qty 4

## 2015-07-25 MED ORDER — GI COCKTAIL ~~LOC~~
30.0000 mL | Freq: Four times a day (QID) | ORAL | Status: DC | PRN
  Administered 2015-07-25: 30 mL via ORAL
  Filled 2015-07-25 (×2): qty 30

## 2015-07-25 MED ORDER — CETYLPYRIDINIUM CHLORIDE 0.05 % MT LIQD
7.0000 mL | Freq: Two times a day (BID) | OROMUCOSAL | Status: DC
  Administered 2015-07-26: 7 mL via OROMUCOSAL

## 2015-07-25 MED ORDER — INFLUENZA VAC SPLIT QUAD 0.5 ML IM SUSY
0.5000 mL | PREFILLED_SYRINGE | INTRAMUSCULAR | Status: AC
  Administered 2015-07-26: 0.5 mL via INTRAMUSCULAR
  Filled 2015-07-25 (×2): qty 0.5

## 2015-07-25 MED ORDER — DONEPEZIL HCL 10 MG PO TABS
10.0000 mg | ORAL_TABLET | Freq: Every day | ORAL | Status: DC
  Administered 2015-07-25: 10 mg via ORAL
  Filled 2015-07-25 (×2): qty 1

## 2015-07-25 MED ORDER — ZOLPIDEM TARTRATE 5 MG PO TABS
5.0000 mg | ORAL_TABLET | Freq: Every day | ORAL | Status: DC
  Administered 2015-07-25: 5 mg via ORAL
  Filled 2015-07-25: qty 1

## 2015-07-25 MED ORDER — CHLORHEXIDINE GLUCONATE 0.12 % MT SOLN
15.0000 mL | Freq: Two times a day (BID) | OROMUCOSAL | Status: DC
  Administered 2015-07-25 – 2015-07-26 (×2): 15 mL via OROMUCOSAL
  Filled 2015-07-25 (×3): qty 15

## 2015-07-25 MED ORDER — DOCUSATE SODIUM 100 MG PO CAPS
100.0000 mg | ORAL_CAPSULE | Freq: Two times a day (BID) | ORAL | Status: DC | PRN
  Administered 2015-07-25: 100 mg via ORAL
  Filled 2015-07-25: qty 1

## 2015-07-25 MED ORDER — ONDANSETRON HCL 4 MG/2ML IJ SOLN: 4.0000 mg | Freq: Four times a day (QID) | INTRAMUSCULAR | Status: DC | PRN

## 2015-07-25 MED ORDER — SERTRALINE HCL 25 MG PO TABS
25.0000 mg | ORAL_TABLET | Freq: Every day | ORAL | Status: DC
  Administered 2015-07-25 – 2015-07-26 (×2): 25 mg via ORAL
  Filled 2015-07-25 (×2): qty 1

## 2015-07-25 MED ORDER — ACETAMINOPHEN 325 MG PO TABS
650.0000 mg | ORAL_TABLET | ORAL | Status: DC | PRN
  Administered 2015-07-25: 650 mg via ORAL
  Filled 2015-07-25: qty 2

## 2015-07-25 MED ORDER — PANTOPRAZOLE SODIUM 40 MG PO TBEC
40.0000 mg | DELAYED_RELEASE_TABLET | Freq: Every day | ORAL | Status: DC
  Administered 2015-07-25 – 2015-07-26 (×2): 40 mg via ORAL
  Filled 2015-07-25 (×2): qty 1

## 2015-07-25 NOTE — ED Provider Notes (Addendum)
CSN: [id number]     Arrival date & time 07/25/15  1533 History   First MD Initiated Contact with Patient 07/25/15 1600     Chief Complaint  Patient presents with  . bilateral shoulder pain      (Consider location/radiation/quality/duration/timing/severity/associated sxs/prior Treatment) Patient is a 81 y.o. male presenting with shortness of breath. The history is provided by the patient.  Shortness of Breath Severity:  Moderate Onset quality:  Gradual Duration:  2 weeks Timing:  Intermittent Progression:  Waxing and waning Chronicity:  New Context: activity   Context comment:  Lying flat Relieved by:  Nothing Worsened by:  Nothing tried Ineffective treatments:  None tried Associated symptoms: chest pain (2 weeks ago, resolved, left sided pressure)   Associated symptoms: no fever and no vomiting   Risk factors: alcohol use, hx of cancer (prostate) and tobacco use (1/2 ppd)   Risk factors comment:  Lost to follow up with PCP for 5 months   Past Medical History  Diagnosis Date  . Arthritis   . Cancer     prostate   . Stroke   . 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 Nasal congestion   . Hearing loss   . Difficulty urinating   . Weakness   . Dementia    Past Surgical History  Procedure Laterality Date  . Hernia repair  2012   No family history on file. Social History  Substance Use Topics  . Smoking status: Current Every Day Smoker  . Smokeless tobacco: None  . Alcohol Use: Yes     Comment: daily    Review of Systems  Constitutional: Negative for fever.  Respiratory: Positive for shortness of breath.   Cardiovascular: Positive for chest pain (2 weeks ago, resolved, left sided pressure).  Gastrointestinal: Negative for vomiting.  All other systems reviewed and are negative.     Allergies  Review of patient's allergies indicates no known allergies.  Home Medications   Prior to Admission medications   Medication Sig Start Date End Date  Taking? Authorizing Provider  docusate sodium (COLACE) 100 MG capsule Take 1 capsule (100 mg total) by mouth 2 (two) times daily as needed for mild constipation. 06/01/15   [name] Freiberg, MD  donepezil (ARICEPT) 10 MG tablet Take 1 tablet (10 mg total) by mouth at bedtime. 06/01/15   [name] Freiberg, MD  Eszopiclone (ESZOPICLONE) 3 MG TABS Take 3 mg by mouth at bedtime. Take immediately before bedtime    Historical Provider, MD  Multiple Vitamin (MULTIVITAMIN WITH MINERALS) TABS tablet Take 1 tablet by mouth daily.    Historical Provider, MD  Pseudoeph-Doxylamine-DM-APAP (NYQUIL PO) Take 30 mLs by mouth at bedtime as needed (cold symptoms).    Historical Provider, MD  sertraline (ZOLOFT) 25 MG tablet Take 1 tablet (25 mg total) by mouth daily. 06/01/15   [name] Freiberg, MD   BP 148/92 mmHg  Pulse 81  Temp(Src) 98.1 F (36.7 C) (Oral)  Resp 12  SpO2 96% Physical Exam  Constitutional: He is oriented to person, place, and time. He appears well-developed and well-nourished. No distress.  HENT:  Head: Normocephalic and atraumatic.  Eyes: Conjunctivae are normal.  Neck: Neck supple. No tracheal deviation present.  Cardiovascular: Normal rate and regular rhythm.   Pulmonary/Chest: Effort normal. No respiratory distress. He has no wheezes. He has rales (bibasilar). He exhibits no tenderness.  Abdominal: Soft. He exhibits no distension. There is no tenderness. There is no rebound and no  guarding.  Neurological: He is alert and oriented to person, place, and time.  Skin: Skin is warm and dry.  Psychiatric: He has a normal mood and affect.    ED Course  Procedures (including critical care time) Emergency Focused Ultrasound Exam Limited Ultrasound of the Heart and Pericardium  Performed and interpreted by Dr. Laneta Rice Indication: chest pain, ShOB Multiple views of the heart, pericardium, and IVC are obtained with a multi frequency probe.  Findings: nml contractility, no anechoic fluid, 50% IVC  collapse Interpretation: nml ejection fraction, no pericardial effusion, no depressed CVP Images archived electronically.  CPT Code: 65784   Labs Review Labs Reviewed  COMPREHENSIVE METABOLIC PANEL - Abnormal; Notable for the following:    ALT 5 (*)    All other components within normal limits  CBC WITH DIFFERENTIAL/PLATELET  BRAIN NATRIURETIC PEPTIDE  TSH  MAGNESIUM  I-STAT TROPOININ, ED    Imaging Review Dg Chest 2 View  07/25/2015   CLINICAL DATA:  Chest pain extending into both shoulders.  EXAM: CHEST - 2 VIEW  COMPARISON:  Two-view chest x-ray 08/04/2013.  FINDINGS: The heart size is normal. Atherosclerotic calcifications are again seen at the aortic arch. A moderate-sized hiatal hernia is present.  Emphysematous changes are present. Left basilar airspace opacity is increased since the prior study.  T12 and T9 compression fractures are stable. No new fractures are evident.  IMPRESSION: 1. Stable emphysematous change. 2. Atherosclerosis. 3. Moderate-sized hiatal hernia. 4. Left basilar airspace opacity likely reflects atelectasis. Early infection is also considered.   Electronically Signed   By: [name] Rice M.D.   On: 07/25/2015 16:56   I have personally reviewed and evaluated these images and lab results as part of my medical decision-making.   EKG Interpretation   Date/Time:  Tuesday July 25 2015 16:29:28 EDT Ventricular Rate:  86 PR Interval:  166 QRS Duration: 118 QT Interval:  381 QTC Calculation: 456 R Axis:   80 Text Interpretation:  Sinus rhythm Probable left atrial enlargement  Incomplete right bundle branch block Confirmed by [name] Rice ([id number])  on 07/25/2015 4:35:23 PM      MDM   Final diagnoses:  Left sided chest pain  Bilateral shoulder pain    81 year old male presents with chest pain that occurred roughly 1 week ago that was described as pressure over the left side of his chest, this pain resolved spontaneously and he has since had  some exertional bilateral shoulder discomfort. Has had shortness of breath especially while laying down at home. No effusion or wall motion abnormalities on bedside echo. He has risk factors for coronary artery disease, no acute findings on EKG today, negative troponin, doubt ACS currently. Has had previous admissions with negative enzymes workup but no provocative testing. With exertional pain and high risk features will recommend admission to the hospital for further workup and management of high risk chest pain.    Leo Grosser, MD 07/25/15 1932  Leo Grosser, MD 07/25/15 [id number]

## 2015-07-25 NOTE — ED Notes (Signed)
Per [facility] pt from home and lives alone. Pt reports bilateral shoulder pain with SON for 3 weeks. Pt denies chest pain.per ems EKG was unremarkable. Pt ambulated from ambulance tp ED room with steady gait and in no distress. Pt alert and oriented x 4 yet Hx dementia per chart.

## 2015-07-25 NOTE — ED Notes (Signed)
Bed: WK46 Expected date:  Expected time:  Means of arrival:  Comments: EMS- 81yo M, bilateral shoulder pain

## 2015-07-25 NOTE — H&P (Signed)
History and Physical  [name] Rice  DTO:[id number]  DOB: 24-Feb-1925  DOA: 07/25/2015  Referring physician: Leo Grosser, MD PCP: Mauricio Po, FNP   Chief Complaint: "He's been having episodes of chest pain this week and today was so bad I called 911."  HPI: [name] Rice is a 81 y.o. male with a past medical history significant for dementia moderate, prostate cancer, and stroke who presents with one week intermittent episodes of substernal chest pressure radiating to both shoulders.  Most history is collected from the patient's caregiver who appears to be more reliable than the patient himself. She reports that he began complaining of intermittent substernal chest discomfort which occurred both at rest and with exertion and radiated to both shoulders. The episodes lasted about 20 minutes each time, subsided spontaneously, and her moderate to severe in intensity. He is unable to characterize the pain reliably but does think that the pain caused him to sweat, and caused him to stumble.  In the ED, the patient's EKG was unchanged from his previous, and an initial troponin was negative. The BNP was normal. Chest x-ray was unremarkable.  His agein the accelerating nature of the pain were judged sufficiently high risk to warrant observation.  The patient was admitted in 2011 for chest pain, underwent a nuclear perfusion stress test that showed no evidence of ischemia, and an echocardiogram that showed an ejection fraction of 98-33% without diastolic dysfunction or valvular disease.   Review of Systems:  Patient seen 7:30 PM on 07/25/2015. Pt complains of substernal chest discomfort radiating to the tops of both shoulders. Pt denies any heartburn, abdominal pain, consistent pain with movement of the shoulders, or neck pain.  Otherwise, twelve systems were reviewed and were negative except as noted above in the history of present illness.  Past Medical History  Diagnosis Date  . Arthritis   .  Cancer     prostate   . Stroke   . 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 Nasal congestion   . Hearing loss   . Difficulty urinating   . Weakness   . Dementia   . Diabetes mellitus without complication   The above past medical history was reviewed.  Past Surgical History  Procedure Laterality Date  . Hernia repair  2012  The above surgical history was reviewed.  Social History: Patient lives in a trailer by himself.  He has a Marine scientist aide who is with him every day and assist him with all activities of daily living and medication administration.  He is an active smoker. He his drinking alcohol again, a few beers before bed.  No Known Allergies  Family History  Problem Relation Age of Onset  . Heart attack Father   . Diabetes Brother   . Hypertension Brother     Prior to Admission medications   Medication Sig Start Date End Date Taking? Authorizing Provider  Capsicum Oleoresin (ARTHRITIS PAIN RELIEF RUB EX) Apply 1 application topically daily as needed (arthritis pain.).   Yes Historical Provider, MD  carbamide peroxide (DEBROX) 6.5 % otic solution Place 5 drops into both ears daily as needed (earwax removal.).   Yes Historical Provider, MD  docusate sodium (COLACE) 100 MG capsule Take 1 capsule (100 mg total) by mouth 2 (two) times daily as needed for mild constipation. 06/01/15  Yes Debby Freiberg, MD  donepezil (ARICEPT) 10 MG tablet Take 1 tablet (10 mg total) by mouth at bedtime. 06/01/15  Yes Debby Freiberg,  MD  Eszopiclone (ESZOPICLONE) 3 MG TABS Take 3 mg by mouth at bedtime. Take immediately before bedtime   Yes Historical Provider, MD  ibuprofen (ADVIL,MOTRIN) 200 MG tablet Take 200 mg by mouth every 6 (six) hours as needed for mild pain or moderate pain.   Yes Historical Provider, MD  Multiple Vitamin (MULTIVITAMIN WITH MINERALS) TABS tablet Take 1 tablet by mouth daily.   Yes Historical Provider, MD  OVER THE COUNTER MEDICATION Place 1-2 drops into both eyes  daily as needed (dry eyes.). Over the counter eye drops for dry eyes.   Yes Historical Provider, MD  sertraline (ZOLOFT) 25 MG tablet Take 1 tablet (25 mg total) by mouth daily. 06/01/15  Yes Debby Freiberg, MD    Physical Exam: BP 156/86 mmHg  Pulse 82  Temp(Src) 98.2 F (36.8 C) (Oral)  Resp 16  Ht 5\' 4"  (1.626 m)  Wt 47.764 kg (105 lb 4.8 oz)  BMI 18.07 kg/m2  SpO2 96% General: Thin elderly adult male, alert and in no obvious distress.  Responds appropriately to questions.  Eye contact, dress and hygiene appropriate. HEENT: Head normal.  Corneas clear, conjunctivae and sclerae normal without injection or icterus, lids and lashes normal.  Left iris coloboma.  Right pupil round and reactive.  EOMI.  Nose normal.  OP moist without erythema, exudates, cobblestoning, or ulcers.  the uvula deviates to the patient's right but elevates normally.No other airway deformities.  Neck supple. No thyromegaly.  Lymph: No cervical or axillary lymphadenopathy. Skin: Warm and dry.  No jaundice.  No suspicious rashes or lesions.numerous SKs. Cardiac: RRR, nl S1-S2, no murmurs appreciated.  Capillary refill is less than 2 seconds.  JVP normal.  No LE edema.  Radial and DP pulses 2+ and symmetric. Respiratory: Normal respiratory rate and rhythm.  CTAB without rales or wheezes. Abdomen: BS present.  No TTP or rebound all quadrants.  No masses or organomegaly.  No striae, dilated veins, rashes, or lesions.  No ascites, distension.in the left groin there is no redness or induration. Neuro: Sensorium intact.  Cranial nerves 3-12 intactexcept for pupillary defect as noted above.  Speech is fluent.  Naming is grossly intact, and the patient's recall, recent and remote, as well as general fund of knowledge seem moderately impaired, but the patient appears to me at this time to be capable of decision-making capacity.  Muscle bulk normal.  5/5 grip strength and elbow and wrist flexion/extension, symmetrically.  5/5 hip  flexion symmetrically.  Moves all extremities equally and with normal coordination.    Psych: Appropriate affect.  Normal rate and rhythm of speech.  Thought content appropriate, and thought process linear.  No evidence of aural or visual hallucinations or delusions. Attention and concentration are normal.           Labs on Admission:  The metabolic panel is notable for normal sodium, potassium, bicarbonate and serum creatinine. BNP normal. Initial troponin negative. LFTs unremarkable. Magnesium normal. Albumin 4.1 g/dL. The complete blood count is notable for no evidence of leukocytosis or anemia.  Radiological Exams on Admission: Personally reviewed: Dg Chest 2 View 07/25/2015   No focal opacities that suggest pneumonia, no pneumothorax.  Normal cardiac contours.   EKG: Independently reviewed. Sinus rhythm, incomplete RBBB, old.  Similar to previous in August.  Assessment/Plan Principal Problem:   Chest pain Active Problems:   Alzheimer's disease   Insomnia   ADENOCARCINOMA, PROSTATE, GLEASON GRADE 7    1. Chest discomfort:  The patient presents with substernal  chest discomfort radiating to both shoulders which seems to be accelerating. Given that he has no pain now, and that I really can't get a clear character of the pain, and that he had a reassuring stress testing just 5 years ago, Iam not starting heparin, but aspirin has been given. -telemetry -Hemoglobin A1c and lipid panel in the morning -Serial troponins -Consult cardiology regarding stress testing, appreciate recommendations -the patient ran out of his medicines some weeks ago and hasn't been back to refill them, including his pantoprazole which he takes for reflux in the setting of a hiatal hernia. I have restarted pantoprazole and made GI cocktail available.  2. Alzheimer's:  This is moderate in severity, and appears to be at his baseline. -Continue donepezil  3. insomnia:  The patient feels like his chest  discomfort began after he ran out of his [place]. -Zolpidem nightly    DVT PPx: low risk Diet: cardiac, nothing by mouth after midnight Consultants: cardiology Code Status: full Family Communication: the patient's healthcare power of attorney is his nurse who is present at the bedside. CODE STATUS was confirmed with nurse. The diagnosis of possible angina and plan for serial troponins and consult and cardiology were discussed and agreed upon with the family.   Disposition Plan:  Admission for observation is judged to be reasonable and necessary at this time because the patient's presenting symptoms, physical exam findings, and initial radiographic and laboratory data in the context of chronic comorbidities is felt to place him/her at high risk for further clinical deterioration but it is anticipated at this time that the patient may be medically stable for discharge from the hospital within 2 midnights of admission.  A. The patient's presenting symptoms include substernal chest discomfort radiating to both arms      Edwin Dada [facility] Pager [phone]

## 2015-07-25 NOTE — Progress Notes (Signed)
Heart score is 5 per Order

## 2015-07-26 ENCOUNTER — Encounter (HOSPITAL_COMMUNITY): Payer: Self-pay | Admitting: Cardiology

## 2015-07-26 ENCOUNTER — Other Ambulatory Visit: Payer: Self-pay | Admitting: Cardiology

## 2015-07-26 DIAGNOSIS — R072 Precordial pain: Secondary | ICD-10-CM | POA: Diagnosis not present

## 2015-07-26 DIAGNOSIS — G309 Alzheimer's disease, unspecified: Secondary | ICD-10-CM | POA: Diagnosis not present

## 2015-07-26 DIAGNOSIS — M25511 Pain in right shoulder: Secondary | ICD-10-CM

## 2015-07-26 DIAGNOSIS — M25512 Pain in left shoulder: Secondary | ICD-10-CM

## 2015-07-26 DIAGNOSIS — R0789 Other chest pain: Secondary | ICD-10-CM

## 2015-07-26 LAB — TROPONIN I
Troponin I: <0.03 ng/mL (ref <0.031)
Troponin I: <0.03 ng/mL (ref <0.031)

## 2015-07-26 LAB — LIPID PANEL
CHOL/HDL RATIO: 2.9 ratio
Cholesterol: 182 mg/dL (ref 0–200)
HDL: 62 mg/dL (ref >40)
LDL Cholesterol: 93 mg/dL (ref 0–99)
Triglycerides: 135 mg/dL (ref <150)
VLDL: 27 mg/dL (ref 0–40)

## 2015-07-26 MED ORDER — ISOSORBIDE MONONITRATE ER 30 MG PO TB24: 30.0000 mg | ORAL_TABLET | Freq: Every day | ORAL | Status: DC

## 2015-07-26 MED ORDER — METOPROLOL SUCCINATE ER 25 MG PO TB24: 25.0000 mg | ORAL_TABLET | Freq: Every day | ORAL | Status: DC

## 2015-07-26 NOTE — Care Management Note (Signed)
Case Management Note  Patient Details  Name: Edman Lipsey MRN: [id number] Date of Birth: [birth year]  Subjective/Objective:  81 y/o f admitted w/Chest pain.From home, alone. Has private caregiver.                  Action/Plan:d/c plan home.   Expected Discharge Date:   (unknown)               Expected Discharge Plan:  Home/Self Care  In-House Referral:     Discharge planning Services  CM Consult  Post Acute Care Choice:    Choice offered to:     DME Arranged:    DME Agency:     HH Arranged:    [place] Agency:     Status of Service:  Completed, signed off  Medicare Important Message Given:    Date Medicare IM Given:    Medicare IM give by:    Date Additional Medicare IM Given:    Additional Medicare Important Message give by:     If discussed at [place] of Stay Meetings, dates discussed:    Additional Comments:  Dessa Phi, RN 07/26/2015, 9:48 AM

## 2015-07-26 NOTE — Progress Notes (Signed)
CSW received call from RN, Jodi Geralds that patient is in need of transportation home. CSW confirmed with patient's caregiver, [name] (cell#: [phone]) that she would be able to transport him home. CSW provided [place] with patient's RN, Tess's number [phone]) to call when she's on her way.   No further CSW needs identified - CSW signing off.   Raynaldo Opitz, [facility] Clinical Social Worker cell #: [phone]

## 2015-07-26 NOTE — Discharge Instructions (Addendum)
Chest Pain (Nonspecific) °It is often hard to give a specific diagnosis for the cause of chest pain. There is always a chance that your pain could be related to something serious, such as a heart attack or a blood clot in the lungs. You need to follow up with your health care provider for further evaluation. °CAUSES  °· Heartburn. °· Pneumonia or bronchitis. °· Anxiety or stress. °· Inflammation around your heart (pericarditis) or lung (pleuritis or pleurisy). °· A blood clot in the lung. °· A collapsed lung (pneumothorax). It can develop suddenly on its own (spontaneous pneumothorax) or from trauma to the chest. °· Shingles infection (herpes zoster virus). °The chest wall is composed of bones, muscles, and cartilage. Any of these can be the source of the pain. °· The bones can be bruised by injury. °· The muscles or cartilage can be strained by coughing or overwork. °· The cartilage can be affected by inflammation and become sore (costochondritis). °DIAGNOSIS  °Lab tests or other studies may be needed to find the cause of your pain. Your health care provider may have you take a test called an ambulatory electrocardiogram (ECG). An ECG records your heartbeat patterns over a 24-hour period. You may also have other tests, such as: °· Transthoracic echocardiogram (TTE). During echocardiography, sound waves are used to evaluate how blood flows through your heart. °· Transesophageal echocardiogram (TEE). °· Cardiac monitoring. This allows your health care provider to monitor your heart rate and rhythm in real time. °· Holter monitor. This is a portable device that records your heartbeat and can help diagnose heart arrhythmias. It allows your health care provider to track your heart activity for several days, if needed. °· Stress tests by exercise or by giving medicine that makes the heart beat faster. °TREATMENT  °· Treatment depends on what may be causing your chest pain. Treatment may include: °¨ Acid blockers for  heartburn. °¨ Anti-inflammatory medicine. °¨ Pain medicine for inflammatory conditions. °¨ Antibiotics if an infection is present. °· You may be advised to change lifestyle habits. This includes stopping smoking and avoiding alcohol, caffeine, and chocolate. °· You may be advised to keep your head raised (elevated) when sleeping. This reduces the chance of acid going backward from your stomach into your esophagus. °Most of the time, nonspecific chest pain will improve within 2-3 days with rest and mild pain medicine.  °HOME CARE INSTRUCTIONS  °· If antibiotics were prescribed, take them as directed. Finish them even if you start to feel better. °· For the next few days, avoid physical activities that bring on chest pain. Continue physical activities as directed. °· Do not use any tobacco products, including cigarettes, chewing tobacco, or electronic cigarettes. °· Avoid drinking alcohol. °· Only take medicine as directed by your health care provider. °· Follow your health care provider's suggestions for further testing if your chest pain does not go away. °· Keep any follow-up appointments you made. If you do not go to an appointment, you could develop lasting (chronic) problems with pain. If there is any problem keeping an appointment, call to reschedule. °SEEK MEDICAL CARE IF:  °· Your chest pain does not go away, even after treatment. °· You have a rash with blisters on your chest. °· You have a fever. °SEEK IMMEDIATE MEDICAL CARE IF:  °· You have increased chest pain or pain that spreads to your arm, neck, jaw, back, or abdomen. °· You have shortness of breath. °· You have an increasing cough, or you cough   up blood.  You have severe back or abdominal pain.  You feel nauseous or vomit.  You have severe weakness.  You faint.  You have chills. This is an emergency. Do not wait to see if the pain will go away. Get medical help at once. Call your local emergency services (911 in [place] Do not drive  yourself to the hospital. MAKE SURE YOU:   Understand these instructions.  Will watch your condition.  Will get help right away if you are not doing well or get worse. Document Released: 07/24/2005 Document Revised: 10/19/2013 Document Reviewed: 05/19/2008 [facility] Patient Information 2015 [place], [place]. This information is not intended to replace advice given to you by your health care provider. Make sure you discuss any questions you have with your health care provider.  You are scheduled for a stress test at the [facility] office see follow ups. You need to stop eating and drinking after 8:00 AM on that day.   No caffeine for 24 ours prior to the study Do not wear cologne or strong deodorant for the test.  We will call you the results of the test but you will also have a follow up appointment - the office will call with date and time.

## 2015-07-26 NOTE — Care Management Note (Signed)
Case Management Note  Patient Details  Name: Aldo Sondgeroth MRN: [id number] Date of Birth: 1925-04-02  Subjective/Objective:                    Action/Plan:   Expected Discharge Date:   (unknown)               Expected Discharge Plan:  [place]  In-House Referral:     Discharge planning Services  CM Consult  Post Acute Care Choice:    Choice offered to:  Patient  DME Arranged:    DME Agency:     HH Arranged:  RN, PT, OT, Social Work [employer] Agency:  [place]  Status of Service:  Completed, signed off  Medicare Important Message Given:    Date Medicare IM Given:    Medicare IM give by:    Date Additional Medicare IM Given:    Additional Medicare Important Message give by:     If discussed at [place] of Stay Meetings, dates discussed:    Additional Comments:  Dessa Phi, RN 07/26/2015, 2:53 PM

## 2015-07-26 NOTE — Discharge Summary (Signed)
Physician Discharge Summary  [name] Rice JOA:[id number] DOB: 10-14-1925 DOA: 07/25/2015  PCP: Mauricio Po, FNP  Admit date: 07/25/2015 Discharge date: 07/26/2015  Recommendations for Outpatient Follow-up:  1. Will follow up with cardio on discharge per sch appt 2. Started Imdur and metoprolol   Discharge Diagnoses:  Principal Problem:   Chest pain Active Problems:   Alzheimer's disease   Insomnia   ADENOCARCINOMA, PROSTATE, GLEASON GRADE 7    Discharge Condition: stable   Diet recommendation: as tolerated   History of present illness:  81 y.o. male with a past medical history significant for dementia, stroke, history of prostate cancer. Pt presented to [facility] for evaluation of ongoing mid sternal chest pain radiating to both shoulder, started at rest, about 5/10 in intensity, constant and pressure like. No acute findings on 12 lead EKG. Normal troponin x 3. Has been seen by cardiology who recommended adding metoprolol and Imdur and outpt follow up.   Hospital Course:   Principal Problem:   Chest pain - Will have follow up appt with cardio - Trop x 3 negative - No acute ischemic changes on 12 lead EKG - Started Imdur and metoprolol   Active Problems:   Alzheimer's disease - Stable  - Continue Aricept and Zoloft     Signed:  Leisa Lenz, MD  [facility] 07/26/2015, 4:17 PM  Pager #: [phone]  Time spent in minutes: less than 30 minutes  Procedures:  None   Consultations:  Cardiology, Dr. Percival Spanish   Discharge Exam: Filed Vitals:   07/26/15 0419  BP: 125/64  Pulse: 95  Temp: 97.7 F (36.5 C)  Resp: 18   Filed Vitals:   07/25/15 1625 07/25/15 2019 07/26/15 0038 07/26/15 0419  BP: 148/92 156/86 153/99 125/64  Pulse: 81 82 74 95  Temp: 98.1 F (36.7 C) 98.2 F (36.8 C) 97.7 F (36.5 C) 97.7 F (36.5 C)  TempSrc: Oral Oral Oral Oral  Resp: 12 16 18 18   Height:  5\' 4"  (1.626 m)    Weight:  47.764 kg (105 lb 4.8 oz)    SpO2: 96% 96%  95% 96%    General: Pt is alert, follows commands appropriately, not in acute distress Cardiovascular: Regular rate and rhythm, S1/S2 appreciated  Respiratory: Clear to auscultation bilaterally, no wheezing, no crackles, no rhonchi Abdominal: Soft, non tender, non distended, bowel sounds +, no guarding Extremities: no edema, no cyanosis, pulses palpable bilaterally DP and PT Neuro: Grossly nonfocal  Discharge Instructions  Discharge Instructions    Call MD for:  difficulty breathing, headache or visual disturbances    Complete by:  As directed      Call MD for:  persistant nausea and vomiting    Complete by:  As directed      Call MD for:  severe uncontrolled pain    Complete by:  As directed      Diet - low sodium heart healthy    Complete by:  As directed      Increase activity slowly    Complete by:  As directed             Medication List    TAKE these medications        ARTHRITIS PAIN RELIEF RUB EX  Apply 1 application topically daily as needed (arthritis pain.).     carbamide peroxide 6.5 % otic solution  Commonly known as:  DEBROX  Place 5 drops into both ears daily as needed (earwax removal.).     docusate sodium 100  MG capsule  Commonly known as:  COLACE  Take 1 capsule (100 mg total) by mouth 2 (two) times daily as needed for mild constipation.     donepezil 10 MG tablet  Commonly known as:  ARICEPT  Take 1 tablet (10 mg total) by mouth at bedtime.     eszopiclone 3 MG Tabs  Generic drug:  Eszopiclone  Take 3 mg by mouth at bedtime. Take immediately before bedtime     ibuprofen 200 MG tablet  Commonly known as:  ADVIL,MOTRIN  Take 200 mg by mouth every 6 (six) hours as needed for mild pain or moderate pain.     isosorbide mononitrate 30 MG 24 hr tablet  Commonly known as:  IMDUR  Take 1 tablet (30 mg total) by mouth daily.     metoprolol succinate 25 MG 24 hr tablet  Commonly known as:  TOPROL XL  Take 1 tablet (25 mg total) by mouth daily.      multivitamin with minerals Tabs tablet  Take 1 tablet by mouth daily.     OVER THE COUNTER MEDICATION  Place 1-2 drops into both eyes daily as needed (dry eyes.). Over the counter eye drops for dry eyes.     sertraline 25 MG tablet  Commonly known as:  ZOLOFT  Take 1 tablet (25 mg total) by mouth daily.            Follow-up Information    Follow up with Mauricio Po, [facility]. Schedule an appointment as soon as possible for a visit in 1 week.   Specialty:  Family Medicine   Why:  Appointment made for October 4th, 2016 at 9 am. Patient must show or reschedule. Thank you.    Contact information:   [place] [address] [phone]       Follow up with [facility].   Why:  HHRN/PT/OT/aide/social Radio producer information:   [address] [place] [address] [phone]       Follow up with [facility] On 08/03/2015.   Specialty:  Cardiology   Why:  at 1:00pm for stress test     Contact information:   [address] [place] [phone]      Follow up with Minus Breeding, MD.   Specialty:  Cardiology   Why:  the office will with date and time of appt.    Contact information:   [address] [place] [address] [phone]       Follow up with Mauricio Po, [place]. Schedule an appointment as soon as possible for a visit in 1 week.   Specialty:  Family Medicine   Why:  Follow up appt after recent hospitalization   Contact information:   [place] [address] [phone]        The results of significant diagnostics from this hospitalization (including imaging, microbiology, ancillary and laboratory) are listed below for reference.    Significant Diagnostic Studies: Dg Chest 2 View  07/25/2015   CLINICAL DATA:  Chest pain extending into both shoulders.  EXAM: CHEST - 2 VIEW  COMPARISON:  Two-view chest x-ray 08/04/2013.  FINDINGS: The  heart size is normal. Atherosclerotic calcifications are again seen at the aortic arch. A moderate-sized hiatal hernia is present.  Emphysematous changes are present. Left basilar airspace opacity is increased since the prior study.  T12 and T9 compression fractures are stable. No new fractures are evident.  IMPRESSION: 1. Stable emphysematous change. 2. Atherosclerosis. 3. Moderate-sized hiatal hernia. 4. Left basilar airspace opacity likely reflects atelectasis. Early infection is also considered.   Electronically Signed   By: San Morelle M.D.   On: 07/25/2015 16:56    Microbiology: No results found for this or any previous visit (from the past 240 hour(s)).   Labs: Basic Metabolic Panel:  Recent Labs Lab 07/25/15 1640  NA 137  K 5.1  CL 105  CO2 27  GLUCOSE 93  BUN 19  CREATININE 1.00  CALCIUM 9.6  MG 2.3   Liver Function Tests:  Recent Labs Lab 07/25/15 1640  AST 28  ALT 5*  ALKPHOS 59  BILITOT 0.4  PROT 7.1  ALBUMIN 4.1   No results for input(s): LIPASE, AMYLASE in the last 168 hours. No results for input(s): AMMONIA in the last 168 hours. CBC:  Recent Labs Lab 07/25/15 1640  WBC 5.2  NEUTROABS 2.7  HGB 15.0  HCT 43.5  MCV 96.9  PLT 225   Cardiac Enzymes:  Recent Labs Lab 07/25/15 2207 07/26/15 0015 07/26/15 0420  TROPONINI <0.03 <0.03 <0.03   BNP: BNP (last 3 results)  Recent Labs  07/25/15 1640  BNP 65.6    ProBNP (last 3 results) No results for input(s): PROBNP in the last 8760 hours.  CBG: No results for input(s): GLUCAP in the last 168 hours.

## 2015-07-26 NOTE — Consult Note (Signed)
Reason for Consult: chest pain   Referring Physician: Dr. Loleta Books   PCP:  Mauricio Po, FNP  Primary Cardiologist:Dr. Johnsie Cancel in 2011  [name] Rice is an 81 y.o. male.    Chief Complaint: admitted 07/25/15 for chest pain worse yesterday.   HPI:  81 year old male presented yesterday for chest pain.  He has no significant CAD- was seen by Dr. Johnsie Cancel in 2011 with normal nuc and echo.  No follow up with cardiology since that time.    On initial ER note pain was bil. Shoulder pain for 3 weeks.  Per records chest pain 2 weeks ago and now residual pressure.   Per caregiver pt has been complaining of intermittent substernal chest discomfort with rest and exertion.  + radiation to both shoulders.  Though with palpation the discomfort seems to be present.  He has noted decline in activity over last 3 months.  Cannot walk like he was able to- he becomes very tired.        Other hx of dementia though he lives alone with a care giver.  Also prostate cancer and hx of CVA.   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  + tobacco.      Hx of nuc study 2011 that was normal,  05/11 Echo:EF=60-65%  Troponin neg X 4, BNP low at 65 EKG 07/25/15: Sinus rhythm Probable left atrial enlargement Incomplete right bundle branch block--no acute changes.  EKG 06/01/15: Sinus rhythm RBBB and LPFB  Past Medical History  Diagnosis Date  . Arthritis   . Cancer     prostate   . Stroke   . 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 Nasal congestion   . Hearing loss   . Difficulty urinating   . Weakness   . Dementia   . Diabetes mellitus without complication     Past Surgical History  Procedure Laterality Date  . Hernia repair  2012    Family History  Problem Relation Age of Onset  . Heart attack Father   . Diabetes Brother   . Hypertension Brother    Social History:  reports that he has been smoking.  He does not have any smokeless tobacco history on file. He  reports that he drinks alcohol. He reports that he does not use illicit drugs.  Allergies: No Known Allergies  OUTPATIENT MEDICATIONS: No current facility-administered medications on file prior to encounter.   Current Outpatient Prescriptions on File Prior to Encounter  Medication Sig Dispense Refill  . docusate sodium (COLACE) 100 MG capsule Take 1 capsule (100 mg total) by mouth 2 (two) times daily as needed for mild constipation. 10 capsule 0  . donepezil (ARICEPT) 10 MG tablet Take 1 tablet (10 mg total) by mouth at bedtime. 30 tablet 0  . Eszopiclone (ESZOPICLONE) 3 MG TABS Take 3 mg by mouth at bedtime. Take immediately before bedtime    . Multiple Vitamin (MULTIVITAMIN WITH MINERALS) TABS tablet Take 1 tablet by mouth daily.    . sertraline (ZOLOFT) 25 MG tablet Take 1 tablet (25 mg total) by mouth daily. 30 tablet 0     Results for orders placed or performed during the hospital encounter of 07/25/15 (from the past 48 hour(s))  CBC with Differential/Platelet     Status: None   Collection Time: 07/25/15  4:40 PM  Result Value Ref Range   WBC 5.2 4.0 -  10.5 K/uL   RBC 4.49 4.22 - 5.81 MIL/uL   Hemoglobin 15.0 13.0 - 17.0 g/dL   HCT 43.5 39.0 - 52.0 %   MCV 96.9 78.0 - 100.0 fL   MCH 33.4 26.0 - 34.0 pg   MCHC 34.5 30.0 - 36.0 g/dL   RDW 13.7 11.5 - 15.5 %   Platelets 225 150 - 400 K/uL   Neutrophils Relative % 51 %   Neutro Abs 2.7 1.7 - 7.7 K/uL   Lymphocytes Relative 31 %   Lymphs Abs 1.6 0.7 - 4.0 K/uL   Monocytes Relative 9 %   Monocytes Absolute 0.5 0.1 - 1.0 K/uL   Eosinophils Relative 8 %   Eosinophils Absolute 0.4 0.0 - 0.7 K/uL   Basophils Relative 1 %   Basophils Absolute 0.1 0.0 - 0.1 K/uL  Brain natriuretic peptide     Status: None   Collection Time: 07/25/15  4:40 PM  Result Value Ref Range   B Natriuretic Peptide 65.6 0.0 - 100.0 pg/mL  TSH     Status: None   Collection Time: 07/25/15  4:40 PM  Result Value Ref Range   TSH 1.054 0.350 - 4.500 uIU/mL    Magnesium     Status: None   Collection Time: 07/25/15  4:40 PM  Result Value Ref Range   Magnesium 2.3 1.7 - 2.4 mg/dL  Comprehensive metabolic panel     Status: Abnormal   Collection Time: 07/25/15  4:40 PM  Result Value Ref Range   Sodium 137 135 - 145 mmol/L   Potassium 5.1 3.5 - 5.1 mmol/L   Chloride 105 101 - 111 mmol/L   CO2 27 22 - 32 mmol/L   Glucose, Bld 93 65 - 99 mg/dL   BUN 19 6 - 20 mg/dL   Creatinine, Ser 1.00 0.61 - 1.24 mg/dL   Calcium 9.6 8.9 - 10.3 mg/dL   Total Protein 7.1 6.5 - 8.1 g/dL   Albumin 4.1 3.5 - 5.0 g/dL   AST 28 15 - 41 U/L   ALT 5 (L) 17 - 63 U/L   Alkaline Phosphatase 59 38 - 126 U/L   Total Bilirubin 0.4 0.3 - 1.2 mg/dL   GFR calc non Af Amer >60 >60 mL/min   GFR calc Af Amer >60 >60 mL/min    Comment: (NOTE) The eGFR has been calculated using the CKD EPI equation. This calculation has not been validated in all clinical situations. eGFR's persistently <60 mL/min signify possible Chronic Kidney Disease.    Anion gap 5 5 - 15  I-Stat Troponin, ED (not at [facility])     Status: None   Collection Time: 07/25/15  4:46 PM  Result Value Ref Range   Troponin i, poc 0.01 0.00 - 0.08 ng/mL   Comment 3            Comment: Due to the release kinetics of cTnI, a negative result within the first hours of the onset of symptoms does not rule out myocardial infarction with certainty. If myocardial infarction is still suspected, repeat the test at appropriate intervals.   Troponin I-serum (0, 3, 6 hours)     Status: None   Collection Time: 07/25/15 10:07 PM  Result Value Ref Range   Troponin I <0.03 <0.031 ng/mL    Comment:        NO INDICATION OF MYOCARDIAL INJURY.   Troponin I-serum (0, 3, 6 hours)     Status: None   Collection Time: 07/26/15 12:15 AM  Result Value Ref Range   Troponin I <0.03 <0.031 ng/mL    Comment:        NO INDICATION OF MYOCARDIAL INJURY.   Troponin I-serum (0, 3, 6 hours)     Status: None   Collection Time: 07/26/15   4:20 AM  Result Value Ref Range   Troponin I <0.03 <0.031 ng/mL    Comment:        NO INDICATION OF MYOCARDIAL INJURY.    Dg Chest 2 View  07/25/2015   CLINICAL DATA:  Chest pain extending into both shoulders.  EXAM: CHEST - 2 VIEW  COMPARISON:  Two-view chest x-ray 08/04/2013.  FINDINGS: The heart size is normal. Atherosclerotic calcifications are again seen at the aortic arch. A moderate-sized hiatal hernia is present.  Emphysematous changes are present. Left basilar airspace opacity is increased since the prior study.  T12 and T9 compression fractures are stable. No new fractures are evident.  IMPRESSION: 1. Stable emphysematous change. 2. Atherosclerosis. 3. Moderate-sized hiatal hernia. 4. Left basilar airspace opacity likely reflects atelectasis. Early infection is also considered.   Electronically Signed   By: San Morelle M.D.   On: 07/25/2015 16:56    ROS: General:no colds or fevers, + weight decrease since 2014 Skin:no rashes or ulcers HEENT:no blurred vision, no congestion CV:see HPI PUL:see HPI GI:no diarrhea constipation or melena, no indigestion GU:no hematuria, no dysuria MS:no joint pain, ? Shoulder pain- no claudication Neuro:no syncope, no lightheadedness Endo:+ diabetes, no thyroid disease   Blood pressure 125/64, pulse 95, temperature 97.7 F (36.5 C), temperature source Oral, resp. rate 18, height '5\' 4"'  (1.626 m), weight 105 lb 4.8 oz (47.764 kg), SpO2 96 %.  Wt Readings from Last 3 Encounters:  07/25/15 105 lb 4.8 oz (47.764 kg)  07/15/13 112 lb 10.5 oz (51.1 kg)  04/21/13 125 lb (56.7 kg)    PE: General:Pleasant affect, NAD Skin:Warm and dry, brisk capillary refill HEENT:normocephalic, sclera clear, mucus membranes moist Neck:supple, no JVD, no bruits  Heart:S1S2 RRR without murmur, gallup, rub or click Lungs:clear without rales, rhonchi, or wheezes IDP:OEUM, non tender, + BS, do not palpate liver spleen or masses Ext:no lower ext edema, 2+ pedal  pulses, 2+ radial pulses Neuro:alert and oriented X 3, MAE, follows commands, + facial symmetry    Assessment/Plan Principal Problem:   Chest pain--neg MI, neg nuc in 2011, + RF for CAD.  Pt is NPO could do nuc study today if + Possible cath?  Though medical therapy could be done instead.  Discussed with Dr. Vita Barley - will do [place] Call to risk stratify with plans for medical therapy unless grossly abnormal then would discuss cardiac cath with pt and family.    Attempted to contact legal guardian [name] but unable to reach- they did not answer phone and no place to leave message.  Concern to schedule today without talking to his guardian.  May be able to do this as an outpt.   Active Problems:   Alzheimer's disease   Insomnia   ADENOCARCINOMA, PROSTATE, GLEASON GRADE 7   Tobacco use- discussed importance of stopping.    [place]  Nurse Practitioner Certified [place] Pager [phone] or after 5pm or weekends call [phone] 07/26/2015, 8:52 AM   History and all data above reviewed.  Patient examined.  I agree with the findings as above.  The patient reports some chest pain and shoulder pain.  There are typical and atypical features.  The patient exam reveals  COR:RRR  ,  Lungs: Clear  ,  Abd: Positive bowel sounds, no rebound no guarding, Ext No edema  .  All available labs, radiology testing, previous records reviewed. Agree with documented assessment and plan.   Shoulder/chest pain:  No objective evidence of ischemia. However, given his risk factors we need to risk stratify with a perfusion study.  It would be reasonable, given his high functional level, to consider invasive evaluation if he has an abnormal [employer].   He should go home with Toprol XL 25 mg daily,  Imdur 30 mg daily, ECASA 81 mg daily and SL NTG.   Jeneen Rinks [place]  12:59 PM  07/26/2015

## 2015-07-26 NOTE — Care Management Note (Signed)
Case Management Note  Patient Details  Name: [name] Rice MRN: [id number] Date of Birth: [birth year]  Subjective/Objective:  81 y/o m admitted Chest pain. From home w/private caregiver. [facility].TC Kristen rep aware of [place] orders, & d/c today, after cardio cons.                  Action/Plan:d/c home w/HHC.   Expected Discharge Date:   (unknown)               Expected Discharge Plan:  [place]  In-House Referral:     Discharge planning Services  CM Consult  Post Acute Care Choice:    Choice offered to:  Patient  DME Arranged:    DME Agency:     HH Arranged:  RN, PT, OT HH Agency:  [place] [place]  Status of Service:  Completed, signed off  Medicare Important Message Given:    Date Medicare IM Given:    Medicare IM give by:    Date Additional Medicare IM Given:    Additional Medicare Important Message give by:     If discussed at [place] of Stay Meetings, dates discussed:    Additional Comments:  Dessa Phi, RN 07/26/2015, 12:13 PM

## 2015-07-26 NOTE — Progress Notes (Signed)
Pt complaining of insomnia. Pt already received sleeping pill, but immediatly drank a Coke after and continues to drink Coke that he insists on having and was brought by his at home aid. Educated patient on caffeine intake, but patient refuses to believe RN. Will continue to monitor.

## 2015-07-27 ENCOUNTER — Telehealth: Payer: Self-pay | Admitting: *Deleted

## 2015-07-27 LAB — HEMOGLOBIN A1C
Hgb A1c MFr Bld: 5.9 % — ABNORMAL HIGH (ref 4.8–5.6)
Mean Plasma Glucose: 123 mg/dL

## 2015-07-27 NOTE — Telephone Encounter (Signed)
Transition Care Management Follow-up Telephone Call   Date discharged? 07/26/15   How have you been since you were released from the hospital? Pt states ok (not very talkative)   Do you understand why you were in the hospital? YES   Do you understand the discharge instructions? YES   Where were you discharged to? Home   Items Reviewed:  Medications reviewed: YES  Allergies reviewed: YES  Dietary changes reviewed: NO  Referrals reviewed: NO   Functional Questionnaire:   Activities of Daily Living (ADLs):   He states he are independent in the following: ambulation, bathing and hygiene, feeding, continence, grooming, toileting and dressing States he require assistance with the following: ambulation   Any transportation issues/concerns?: NO   Any patient concerns? NO   Confirmed importance and date/time of follow-up visits scheduled YES, pt had already made appt with Marya Amsler 08/01/15 advise pt to keep appt  Provider Appointment booked with Terri Piedra  Confirmed with patient if condition begins to worsen call PCP or go to the ER.  Patient was given the office number and encouraged to call back with question or concerns.  : YES

## 2015-08-01 ENCOUNTER — Ambulatory Visit: Payer: Medicare HMO | Admitting: Family

## 2015-08-03 ENCOUNTER — Inpatient Hospital Stay (HOSPITAL_COMMUNITY): Admit: 2015-08-03 | Payer: Medicare HMO

## 2015-08-21 ENCOUNTER — Ambulatory Visit: Payer: Medicare HMO | Admitting: Family

## 2015-08-21 DIAGNOSIS — Z0289 Encounter for other administrative examinations: Secondary | ICD-10-CM

## 2015-10-17 ENCOUNTER — Inpatient Hospital Stay (HOSPITAL_COMMUNITY)
Admission: EM | Admit: 2015-10-17 | Discharge: 2015-10-23 | DRG: 392 | Disposition: A | Discharge status: Home Health | Payer: Medicare HMO | Attending: Internal Medicine | Admitting: Internal Medicine

## 2015-10-17 ENCOUNTER — Encounter (HOSPITAL_COMMUNITY): Payer: Self-pay

## 2015-10-17 DIAGNOSIS — N4341 Spermatocele of epididymis, single: Secondary | ICD-10-CM | POA: Diagnosis present

## 2015-10-17 DIAGNOSIS — G309 Alzheimer's disease, unspecified: Secondary | ICD-10-CM | POA: Diagnosis present

## 2015-10-17 DIAGNOSIS — Z8546 Personal history of malignant neoplasm of prostate: Secondary | ICD-10-CM

## 2015-10-17 DIAGNOSIS — H9190 Unspecified hearing loss, unspecified ear: Secondary | ICD-10-CM | POA: Diagnosis present

## 2015-10-17 DIAGNOSIS — R1084 Generalized abdominal pain: Secondary | ICD-10-CM | POA: Insufficient documentation

## 2015-10-17 DIAGNOSIS — K529 Noninfective gastroenteritis and colitis, unspecified: Secondary | ICD-10-CM | POA: Diagnosis not present

## 2015-10-17 DIAGNOSIS — R1030 Lower abdominal pain, unspecified: Secondary | ICD-10-CM

## 2015-10-17 DIAGNOSIS — Z79899 Other long term (current) drug therapy: Secondary | ICD-10-CM

## 2015-10-17 DIAGNOSIS — Z833 Family history of diabetes mellitus: Secondary | ICD-10-CM

## 2015-10-17 DIAGNOSIS — F17200 Nicotine dependence, unspecified, uncomplicated: Secondary | ICD-10-CM | POA: Diagnosis present

## 2015-10-17 DIAGNOSIS — N3289 Other specified disorders of bladder: Secondary | ICD-10-CM | POA: Diagnosis present

## 2015-10-17 DIAGNOSIS — F0280 Dementia in other diseases classified elsewhere without behavioral disturbance: Secondary | ICD-10-CM | POA: Diagnosis present

## 2015-10-17 DIAGNOSIS — R109 Unspecified abdominal pain: Secondary | ICD-10-CM | POA: Diagnosis present

## 2015-10-17 DIAGNOSIS — Z66 Do not resuscitate: Secondary | ICD-10-CM | POA: Diagnosis present

## 2015-10-17 DIAGNOSIS — Z8673 Personal history of transient ischemic attack (TIA), and cerebral infarction without residual deficits: Secondary | ICD-10-CM

## 2015-10-17 DIAGNOSIS — M1990 Unspecified osteoarthritis, unspecified site: Secondary | ICD-10-CM | POA: Diagnosis present

## 2015-10-17 DIAGNOSIS — E119 Type 2 diabetes mellitus without complications: Secondary | ICD-10-CM | POA: Diagnosis present

## 2015-10-17 DIAGNOSIS — R269 Unspecified abnormalities of gait and mobility: Secondary | ICD-10-CM | POA: Diagnosis present

## 2015-10-17 DIAGNOSIS — G4700 Insomnia, unspecified: Secondary | ICD-10-CM | POA: Diagnosis present

## 2015-10-17 LAB — COMPREHENSIVE METABOLIC PANEL
ALBUMIN: 3.9 g/dL (ref 3.5–5.0)
ALT: 14 U/L — AB (ref 17–63)
AST: 25 U/L (ref 15–41)
Alkaline Phosphatase: 50 U/L (ref 38–126)
Anion gap: 13 (ref 5–15)
BUN: 13 mg/dL (ref 6–20)
CHLORIDE: 106 mmol/L (ref 101–111)
CO2: 22 mmol/L (ref 22–32)
CREATININE: 1 mg/dL (ref 0.61–1.24)
Calcium: 9.2 mg/dL (ref 8.9–10.3)
GFR calc Af Amer: >60 mL/min (ref >60)
GFR calc non Af Amer: >60 mL/min (ref >60)
Glucose, Bld: 76 mg/dL (ref 65–99)
POTASSIUM: 3.5 mmol/L (ref 3.5–5.1)
SODIUM: 141 mmol/L (ref 135–145)
Total Bilirubin: 0.7 mg/dL (ref 0.3–1.2)
Total Protein: 6.6 g/dL (ref 6.5–8.1)

## 2015-10-17 LAB — URINALYSIS, ROUTINE W REFLEX MICROSCOPIC
GLUCOSE, UA: NEGATIVE mg/dL
HGB URINE DIPSTICK: NEGATIVE
KETONES UR: 40 mg/dL — AB
Leukocytes, UA: NEGATIVE
Nitrite: NEGATIVE
PH: 5.5 (ref 5.0–8.0)
PROTEIN: NEGATIVE mg/dL
Specific Gravity, Urine: 1.025 (ref 1.005–1.030)

## 2015-10-17 LAB — I-STAT CG4 LACTIC ACID, ED: Lactic Acid, Venous: 2.59 mmol/L (ref 0.5–2.0)

## 2015-10-17 LAB — CBC
HEMATOCRIT: 44.4 % (ref 39.0–52.0)
Hemoglobin: 15 g/dL (ref 13.0–17.0)
MCH: 33.3 pg (ref 26.0–34.0)
MCHC: 33.8 g/dL (ref 30.0–36.0)
MCV: 98.4 fL (ref 78.0–100.0)
PLATELETS: 248 10*3/uL (ref 150–400)
RBC: 4.51 MIL/uL (ref 4.22–5.81)
RDW: 13.9 % (ref 11.5–15.5)
WBC: 4.7 10*3/uL (ref 4.0–10.5)

## 2015-10-17 LAB — LIPASE, BLOOD: Lipase: 24 U/L (ref 11–51)

## 2015-10-17 MED ORDER — MORPHINE SULFATE (PF) 2 MG/ML IV SOLN
2.0000 mg | INTRAVENOUS | Status: DC | PRN
  Administered 2015-10-17: 2 mg via INTRAVENOUS
  Filled 2015-10-17: qty 1

## 2015-10-17 MED ORDER — SODIUM CHLORIDE 0.9 % IV BOLUS (SEPSIS)
500.0000 mL | Freq: Once | INTRAVENOUS | Status: AC
  Administered 2015-10-17: 500 mL via INTRAVENOUS

## 2015-10-17 MED ORDER — ONDANSETRON HCL 4 MG/2ML IJ SOLN
4.0000 mg | Freq: Once | INTRAMUSCULAR | Status: AC
  Administered 2015-10-17: 4 mg via INTRAVENOUS
  Filled 2015-10-17: qty 2

## 2015-10-17 NOTE — ED Provider Notes (Signed)
CSN: [id number]     Arrival date & time 10/17/15  1910 History   First MD Initiated Contact with Patient 10/17/15 2049     Chief Complaint  Patient presents with  . Abdominal Pain     (Consider location/radiation/quality/duration/timing/severity/associated sxs/prior Treatment) HPI 81 year old male with history of mild dementia, diabetes, and prior history of hernia repair in 2012 who presents with abdominal pain. History is provided by the patient and his home nurse. States that he has been having intermittent severe abdominal pain over the course of the past 5 days. I he has had decreased by mouth intake and had vomiting yesterday and today. States that 3 days ago had episode of loose stools, but has not had a bowel movement since. Denies any significant abdominal distention, urinary complaints, fevers, night sweats, melena or hematochezia. Symptoms have been poorly controlled with ibuprofen. Past Medical History  Diagnosis Date  . Arthritis   . Cancer [facility])     prostate   . Stroke ([place])   . 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 Nasal congestion   . Hearing loss   . Difficulty urinating   . Weakness   . Dementia   . Diabetes mellitus without complication [facility])    Past Surgical History  Procedure Laterality Date  . Hernia repair  2012   Family History  Problem Relation Age of Onset  . Heart attack Father   . Diabetes Brother   . Hypertension Brother    Social History  Substance Use Topics  . Smoking status: Current Every Day Smoker  . Smokeless tobacco: None  . Alcohol Use: Yes     Comment: daily    Review of Systems 10/14 systems reviewed and are negative other than those stated in the HPI    Allergies  Review of patient's allergies indicates no known allergies.  Home Medications   Prior to Admission medications   Medication Sig Start Date End Date Taking? Authorizing Provider  Capsicum Oleoresin (ARTHRITIS PAIN RELIEF RUB EX) Apply 1  application topically daily as needed (arthritis pain.).   Yes Historical Provider, MD  carbamide peroxide (DEBROX) 6.5 % otic solution Place 5 drops into both ears daily as needed (earwax removal.).   Yes Historical Provider, MD  docusate sodium (COLACE) 100 MG capsule Take 1 capsule (100 mg total) by mouth 2 (two) times daily as needed for mild constipation. 06/01/15  Yes Debby Freiberg, MD  donepezil (ARICEPT) 10 MG tablet Take 1 tablet (10 mg total) by mouth at bedtime. 06/01/15  Yes Debby Freiberg, MD  Eszopiclone (ESZOPICLONE) 3 MG TABS Take 3 mg by mouth at bedtime. Take immediately before bedtime   Yes Historical Provider, MD  ibuprofen (ADVIL,MOTRIN) 200 MG tablet Take 200 mg by mouth every 6 (six) hours as needed for mild pain or moderate pain.   Yes Historical Provider, MD  Multiple Vitamin (MULTIVITAMIN WITH MINERALS) TABS tablet Take 1 tablet by mouth daily.   Yes Historical Provider, MD  OVER THE COUNTER MEDICATION Place 1-2 drops into both eyes daily as needed (dry eyes.). Over the counter eye drops for dry eyes.   Yes Historical Provider, MD  sertraline (ZOLOFT) 25 MG tablet Take 1 tablet (25 mg total) by mouth daily. 06/01/15  Yes Debby Freiberg, MD  isosorbide mononitrate (IMDUR) 30 MG 24 hr tablet Take 1 tablet (30 mg total) by mouth daily. Patient not taking: Reported on 10/17/2015 07/26/15   Robbie Lis, MD  metoprolol  succinate (TOPROL XL) 25 MG 24 hr tablet Take 1 tablet (25 mg total) by mouth daily. Patient not taking: Reported on 10/17/2015 07/26/15   Robbie Lis, MD   BP 117/71 mmHg  Pulse 73  Temp(Src) 98.5 F (36.9 C) (Oral)  Resp 14  SpO2 93% Physical Exam Physical Exam  Nursing note and vitals reviewed. Constitutional: Well developed, well nourished, non-toxic, and in no acute distress Head: Normocephalic and atraumatic.  Mouth/Throat: Oropharynx is clear and moist.  Neck: Normal range of motion. Neck supple.  Cardiovascular: Normal rate and regular rhythm.    Pulmonary/Chest: Effort normal and breath sounds normal.  Abdominal: Soft. No significant abdominal distention. There is diffuse tenderness. There is no rebound and no guarding.  Musculoskeletal: Normal range of motion.  Neurological: Alert, no facial droop, fluent speech, moves all extremities symmetrically Skin: Skin is warm and dry.  Psychiatric: Cooperative  ED Course  Procedures (including critical care time) Labs Review Labs Reviewed  COMPREHENSIVE METABOLIC PANEL - Abnormal; Notable for the following:    ALT 14 (*)    All other components within normal limits  URINALYSIS, ROUTINE W REFLEX MICROSCOPIC (NOT AT [facility]) - Abnormal; Notable for the following:    Bilirubin Urine MODERATE (*)    Ketones, ur 40 (*)    All other components within normal limits  I-STAT CG4 LACTIC ACID, ED - Abnormal; Notable for the following:    Lactic Acid, Venous 2.59 (*)    All other components within normal limits  LIPASE, BLOOD  CBC  D-DIMER, QUANTITATIVE (NOT AT [facility])  AMYLASE  I-STAT CG4 LACTIC ACID, ED    Imaging Review Ct Abdomen Pelvis W Contrast  10/18/2015  CLINICAL DATA:  81 year old male with abdominal pain and episodes of nausea vomiting and constipation. EXAM: CT ABDOMEN AND PELVIS WITH CONTRAST TECHNIQUE: Multidetector CT imaging of the abdomen and pelvis was performed using the standard protocol following bolus administration of intravenous contrast. CONTRAST:  50mL OMNIPAQUE IOHEXOL 300 MG/ML  SOLN COMPARISON:  CT dated 06/01/2015 FINDINGS: Bibasilar subsegmental linear atelectasis/ scarring. The visualized lung bases are otherwise clear. There is coronary vascular calcification. No intra-abdominal free air or free fluid. Stable appearing left hepatic cystic lesion. Stable appearing lobulated vascular lesion in the periphery of the right lobe of the liver most compatible with a portal venous shunting. The gallbladder, pancreas, spleen, and the adrenal glands appear unremarkable. Stable  appearing left renal hypodense lesions, likely cysts. Small left renal parapelvic cysts noted. There is a 3 mm nonobstructing right renal interpolar calculus. No hydronephrosis on either side. The visualized ureters appear unremarkable. There is diffuse thickening and trabecular appearance of the bladder wall compatible with chronic bladder outlet obstruction. The prostate gland is enlarged with median lobe hypertrophy. There is a large hiatal hernia with gastroesophageal reflux. Oral contrast opacifies the stomach and multiple loops of small bowel. There is no evidence of bowel obstruction or inflammation. There is apparent diffuse thickening of the wall of the colon predominantly involving the rectosigmoid which may be related to underdistention. Colitis is not excluded. Clinical correlation is recommended. There is sigmoid diverticulosis without active inflammation. Normal appendix. Advanced aortoiliac atherosclerotic disease. The origins of the celiac axis, SMA, IMA as well as the origins of the renal arteries appear patent. No portal venous gas identified. There is no adenopathy. The abdominal wall soft tissues appear unremarkable. There is osteopenia with extensive degenerative changes of the spine. Old healed left posterior rib fractures. No acute fracture identified. There are bilateral  L5 pars defects with grade 1 L5-S1 anterolisthesis. There is T12 compression fractures similar to prior study. IMPRESSION: Under distention versus colitis. Correlation with clinical exam and stool cultures recommended. No bowel obstruction. Normal appendix. Moderate-sized hiatal hernia. Diffuse bladder wall thickening sequela of chronic bladder outlet obstruction. Correlation with urinalysis recommended to exclude superimposed UTI. Other findings similar to the prior CT. Electronically Signed   By: Anner Crete M.D.   On: 10/18/2015 01:02   I have personally reviewed and evaluated these images and lab results as part of  my medical decision-making.   EKG Interpretation None      MDM   Final diagnoses:  Generalized abdominal pain  Colitis   in short, this is an 81 year old male with history of diabetes and hernia repair who presents with abdominal pain with nausea, vomiting, and diarrhea. Presentation is nontoxic and in no acute distress. Vital signs are non-concerning. He has no leukocytosis and unremarkable CBC, comprehensive metabolic profile, lipase and urinalysis. Mildly elevated lactate at 2.5, which resolves with 500 mL of fluid. Abdomen is soft and non-peritoneal, but he does have tenderness in the lower abdomen. Does not appear to be out of proportion to the exam. He did undergo CT abdomen pelvis showing possible colitis for this is under distention of the bowel. No evidence of obstruction or concern for ischemia. I did receive multiple doses of morphine with persistent pain. Given his age and likely colitis he is admitted for observation for pain control and serial abdominal exams.  Forde Dandy, MD 10/18/15 0157

## 2015-10-17 NOTE — ED Notes (Signed)
Per caregiver, pt had an inguinal hernia surgery in 2012 and pt has been c/o abd pain for the past 5 days. She states he has been up all night screaming and hollering from the pain. Has been vomiting but the last time was 2 nights ago.

## 2015-10-17 NOTE — ED Notes (Signed)
Pt's caregiver st's pt's has hernia in groin area.  St's it moves from side to side.  St's pt can not rest at night due to the pain.

## 2015-10-18 ENCOUNTER — Encounter (HOSPITAL_COMMUNITY): Payer: Self-pay | Admitting: Radiology

## 2015-10-18 ENCOUNTER — Emergency Department (HOSPITAL_COMMUNITY): Payer: Medicare HMO

## 2015-10-18 ENCOUNTER — Observation Stay (HOSPITAL_COMMUNITY): Payer: Medicare HMO

## 2015-10-18 DIAGNOSIS — M1990 Unspecified osteoarthritis, unspecified site: Secondary | ICD-10-CM | POA: Diagnosis present

## 2015-10-18 DIAGNOSIS — N4341 Spermatocele of epididymis, single: Secondary | ICD-10-CM | POA: Diagnosis present

## 2015-10-18 DIAGNOSIS — H9190 Unspecified hearing loss, unspecified ear: Secondary | ICD-10-CM | POA: Diagnosis present

## 2015-10-18 DIAGNOSIS — K529 Noninfective gastroenteritis and colitis, unspecified: Secondary | ICD-10-CM | POA: Diagnosis present

## 2015-10-18 DIAGNOSIS — R1030 Lower abdominal pain, unspecified: Secondary | ICD-10-CM | POA: Diagnosis present

## 2015-10-18 DIAGNOSIS — Z833 Family history of diabetes mellitus: Secondary | ICD-10-CM | POA: Diagnosis not present

## 2015-10-18 DIAGNOSIS — Z8673 Personal history of transient ischemic attack (TIA), and cerebral infarction without residual deficits: Secondary | ICD-10-CM | POA: Diagnosis not present

## 2015-10-18 DIAGNOSIS — R1084 Generalized abdominal pain: Secondary | ICD-10-CM | POA: Diagnosis not present

## 2015-10-18 DIAGNOSIS — F17200 Nicotine dependence, unspecified, uncomplicated: Secondary | ICD-10-CM | POA: Diagnosis present

## 2015-10-18 DIAGNOSIS — R269 Unspecified abnormalities of gait and mobility: Secondary | ICD-10-CM

## 2015-10-18 DIAGNOSIS — Z66 Do not resuscitate: Secondary | ICD-10-CM | POA: Diagnosis present

## 2015-10-18 DIAGNOSIS — R109 Unspecified abdominal pain: Secondary | ICD-10-CM | POA: Diagnosis present

## 2015-10-18 DIAGNOSIS — N3289 Other specified disorders of bladder: Secondary | ICD-10-CM | POA: Diagnosis present

## 2015-10-18 DIAGNOSIS — Z79899 Other long term (current) drug therapy: Secondary | ICD-10-CM | POA: Diagnosis not present

## 2015-10-18 DIAGNOSIS — F0280 Dementia in other diseases classified elsewhere without behavioral disturbance: Secondary | ICD-10-CM | POA: Diagnosis present

## 2015-10-18 DIAGNOSIS — Z8546 Personal history of malignant neoplasm of prostate: Secondary | ICD-10-CM

## 2015-10-18 DIAGNOSIS — E119 Type 2 diabetes mellitus without complications: Secondary | ICD-10-CM | POA: Diagnosis present

## 2015-10-18 DIAGNOSIS — G4700 Insomnia, unspecified: Secondary | ICD-10-CM | POA: Diagnosis present

## 2015-10-18 DIAGNOSIS — G309 Alzheimer's disease, unspecified: Secondary | ICD-10-CM | POA: Diagnosis present

## 2015-10-18 LAB — SEDIMENTATION RATE: SED RATE: 7 mm/h (ref 0–16)

## 2015-10-18 LAB — AMYLASE: Amylase: 43 U/L (ref 28–100)

## 2015-10-18 LAB — D-DIMER, QUANTITATIVE: D-Dimer, Quant: 0.41 ug/mL-FEU (ref 0.00–0.50)

## 2015-10-18 LAB — C-REACTIVE PROTEIN

## 2015-10-18 LAB — I-STAT CG4 LACTIC ACID, ED: LACTIC ACID, VENOUS: 1.25 mmol/L (ref 0.5–2.0)

## 2015-10-18 MED ORDER — ONDANSETRON HCL 4 MG/2ML IJ SOLN: 4.0000 mg | Freq: Three times a day (TID) | INTRAMUSCULAR | Status: DC | PRN

## 2015-10-18 MED ORDER — DOCUSATE SODIUM 100 MG PO CAPS: 100.0000 mg | ORAL_CAPSULE | Freq: Two times a day (BID) | ORAL | Status: DC | PRN

## 2015-10-18 MED ORDER — SODIUM CHLORIDE 0.9 % IV SOLN
INTRAVENOUS | Status: DC
Start: 2015-10-18 — End: 2015-10-24
  Administered 2015-10-18: 05:00:00 via INTRAVENOUS

## 2015-10-18 MED ORDER — SERTRALINE HCL 50 MG PO TABS
25.0000 mg | ORAL_TABLET | Freq: Every day | ORAL | Status: DC
  Administered 2015-10-18 – 2015-10-23 (×6): 25 mg via ORAL
  Filled 2015-10-18 (×6): qty 1

## 2015-10-18 MED ORDER — IBUPROFEN 200 MG PO TABS: 200.0000 mg | ORAL_TABLET | Freq: Four times a day (QID) | ORAL | Status: DC | PRN

## 2015-10-18 MED ORDER — METRONIDAZOLE IN NACL 5-0.79 MG/ML-% IV SOLN
500.0000 mg | Freq: Three times a day (TID) | INTRAVENOUS | Status: DC
  Administered 2015-10-18 – 2015-10-19 (×3): 500 mg via INTRAVENOUS
  Filled 2015-10-18 (×3): qty 100

## 2015-10-18 MED ORDER — MORPHINE SULFATE (PF) 2 MG/ML IV SOLN: 2.0000 mg | INTRAVENOUS | Status: DC | PRN

## 2015-10-18 MED ORDER — MORPHINE SULFATE (PF) 2 MG/ML IV SOLN
2.0000 mg | Freq: Once | INTRAVENOUS | Status: AC
  Administered 2015-10-18: 2 mg via INTRAVENOUS
  Filled 2015-10-18: qty 1

## 2015-10-18 MED ORDER — MORPHINE SULFATE (PF) 2 MG/ML IV SOLN: 1.0000 mg | INTRAVENOUS | Status: DC | PRN

## 2015-10-18 MED ORDER — ONDANSETRON HCL 4 MG PO TABS
4.0000 mg | ORAL_TABLET | Freq: Four times a day (QID) | ORAL | Status: DC | PRN
  Administered 2015-10-20: 4 mg via ORAL
  Filled 2015-10-18: qty 1

## 2015-10-18 MED ORDER — ADULT MULTIVITAMIN W/MINERALS CH
1.0000 | ORAL_TABLET | Freq: Every day | ORAL | Status: DC
  Administered 2015-10-18 – 2015-10-23 (×6): 1 via ORAL
  Filled 2015-10-18 (×6): qty 1

## 2015-10-18 MED ORDER — IOHEXOL 300 MG/ML  SOLN
80.0000 mL | Freq: Once | INTRAMUSCULAR | Status: AC | PRN
  Administered 2015-10-18: 80 mL via INTRAVENOUS

## 2015-10-18 MED ORDER — CIPROFLOXACIN IN D5W 400 MG/200ML IV SOLN
400.0000 mg | Freq: Two times a day (BID) | INTRAVENOUS | Status: DC
  Administered 2015-10-18 – 2015-10-19 (×2): 400 mg via INTRAVENOUS
  Filled 2015-10-18 (×3): qty 200

## 2015-10-18 MED ORDER — DONEPEZIL HCL 10 MG PO TABS
10.0000 mg | ORAL_TABLET | Freq: Every day | ORAL | Status: DC
  Administered 2015-10-18 – 2015-10-23 (×5): 10 mg via ORAL
  Filled 2015-10-18 (×5): qty 1

## 2015-10-18 MED ORDER — ENOXAPARIN SODIUM 30 MG/0.3ML ~~LOC~~ SOLN
30.0000 mg | SUBCUTANEOUS | Status: DC
  Administered 2015-10-18 – 2015-10-23 (×6): 30 mg via SUBCUTANEOUS
  Filled 2015-10-18 (×7): qty 0.3

## 2015-10-18 MED ORDER — DOCUSATE SODIUM 100 MG PO CAPS
100.0000 mg | ORAL_CAPSULE | Freq: Two times a day (BID) | ORAL | Status: DC
  Administered 2015-10-18 – 2015-10-23 (×11): 100 mg via ORAL
  Filled 2015-10-18 (×10): qty 1

## 2015-10-18 MED ORDER — HYDROCODONE-ACETAMINOPHEN 5-325 MG PO TABS
1.0000 | ORAL_TABLET | Freq: Four times a day (QID) | ORAL | Status: DC | PRN
  Administered 2015-10-19 – 2015-10-20 (×2): 1 via ORAL
  Filled 2015-10-18 (×3): qty 1

## 2015-10-18 MED ORDER — ZOLPIDEM TARTRATE 5 MG PO TABS
5.0000 mg | ORAL_TABLET | Freq: Every evening | ORAL | Status: DC | PRN
  Administered 2015-10-18 – 2015-10-19 (×3): 5 mg via ORAL
  Filled 2015-10-18 (×3): qty 1

## 2015-10-18 MED ORDER — ONDANSETRON HCL 4 MG/2ML IJ SOLN: 4.0000 mg | Freq: Four times a day (QID) | INTRAMUSCULAR | Status: DC | PRN

## 2015-10-18 NOTE — Progress Notes (Signed)
Called ED nurse for report. 

## 2015-10-18 NOTE — ED Notes (Signed)
Pt returned from CT °

## 2015-10-18 NOTE — Progress Notes (Signed)
New Admission Note  Arrival: Via stretcher Mental Orientation: Alert & oriented X2.  Telemetry: Non tele Skin: No skin issues verified by 2nd RN Becca [id number] Pain: no c/o of pain Safety measures:  verbalized understanding. Bed in lowest position. Yellow bracelet on. Non-skid socks on. Bed alarm on. Family: Care giver at bedside.  Orders have been reviewed and implemented. Will continue to monitor.

## 2015-10-18 NOTE — Progress Notes (Signed)
[facility] PROGRESS NOTE  [name] Rice [id number] DOB: 06-06-1925 DOA: 10/17/2015 PCP: No primary care provider on file.  Assessment/Plan: [name] is a 81 year old male with a past medical history significant for dementia, insomnia, prostate cancer, and previous right hernia repair; Who presents with a five-day history of lower abdominal pain. Patient history is given by his power of attorney is present in the room as the patient has history of dementia and is only able to provide parts of the history. Symptoms have occurred intermittently and progressively worsened over the last 5 days. Pain was described as sharp in nature. His caregiver notes that he previously was having episodes of diarrhea, but his last bowel movement was approximately 3 days ago  Abdominal Pain:  CT with possible colitis.  ESR, CRP no significant elevated. D dimer negative.  [place] negative for inguinal hernia.  Will start ciprofloxacin  and flagyl.  Check urine culture.  Start clear diet   H/O prostate cancer: Patient was reported to be treated with some medication   Bladder wall thickening: As seen on CT. May need further work-up out patient. Will check urine culture.  left epididymis shows a 1.5 cm cyst or spermatocele; needs to follow up with urology.   Alzheimer's disease - continue Aricept   Insomnia - therapeutic substitution for home medication with Ambien   History of gait disturbance - PT To eval and treat  Hx of Diabetes mellitus type 2 diet-controlled - continue to monitor Code Status: DNR Family Communication: no family at bedside Disposition Plan: remain inpatient   Consultants:  none  Procedures:  [place]  Antibiotics:  cipro 12-21  Flagyl 12-21  HPI/Subjective: He is feeling better, pain is better   Objective: Filed Vitals:   10/18/15 0539 10/18/15 0938  BP: 119/70 123/72  Pulse: 61 73  Temp: 98.4 F (36.9 C) 98.2 F (36.8 C)  Resp: 16 18     Intake/Output Summary (Last 24 hours) at 10/18/15 1358 Last data filed at 10/18/15 0900  Gross per 24 hour  Intake    740 ml  Output      0 ml  Net    740 ml   Filed Weights   10/18/15 0257  Weight: 47.446 kg (104 lb 9.6 oz)    Exam:   General:  Alert in no distress  Cardiovascular: S 1, S 2 RRR  Respiratory: CTA  Abdomen: BS present, soft, mild supra-pubic tenderness  Musculoskeletal: no edema  Data Reviewed: Basic Metabolic Panel:  Recent Labs Lab 10/17/15 1956  NA 141  K 3.5  CL 106  CO2 22  GLUCOSE 76  BUN 13  CREATININE 1.00  CALCIUM 9.2   Liver Function Tests:  Recent Labs Lab 10/17/15 1956  AST 25  ALT 14*  ALKPHOS 50  BILITOT 0.7  PROT 6.6  ALBUMIN 3.9    Recent Labs Lab 10/17/15 1956 10/18/15 0158  LIPASE 24  --   AMYLASE  --  43   No results for input(s): AMMONIA in the last 168 hours. CBC:  Recent Labs Lab 10/17/15 1956  WBC 4.7  HGB 15.0  HCT 44.4  MCV 98.4  PLT 248   Cardiac Enzymes: No results for input(s): CKTOTAL, CKMB, CKMBINDEX, TROPONINI in the last 168 hours. BNP (last 3 results)  Recent Labs  07/25/15 1640  BNP 65.6    ProBNP (last 3 results) No results for input(s): PROBNP in the last 8760 hours.  CBG: No results for input(s): GLUCAP in the last 168  hours.  No results found for this or any previous visit (from the past 240 hour(s)).   Studies: US Pelvis Limited  10/18/2015  CLINICAL DATA:  Lower abdominal pain.  Question inguinal hernia. EXAM: LIMITED ULTRASOUND OF PELVIS TECHNIQUE: Limited transabdominal ultrasound examination of the pelvis was performed. COMPARISON:  CT abdomen pelvis 10/18/2015. FINDINGS: No inguinal hernia. No solid or cystic mass. Incidental imaging of the left epididymis shows a 1.5 cm cyst or spermatocele. IMPRESSION: No evidence of an inguinal hernia. Electronically Signed   By: Lorin Picket M.D.   On: 10/18/2015 08:08   Ct Abdomen Pelvis W Contrast  10/18/2015   CLINICAL DATA:  81 year old male with abdominal pain and episodes of nausea vomiting and constipation. EXAM: CT ABDOMEN AND PELVIS WITH CONTRAST TECHNIQUE: Multidetector CT imaging of the abdomen and pelvis was performed using the standard protocol following bolus administration of intravenous contrast. CONTRAST:  4m OMNIPAQUE IOHEXOL 300 MG/ML  SOLN COMPARISON:  CT dated 06/01/2015 FINDINGS: Bibasilar subsegmental linear atelectasis/ scarring. The visualized lung bases are otherwise clear. There is coronary vascular calcification. No intra-abdominal free air or free fluid. Stable appearing left hepatic cystic lesion. Stable appearing lobulated vascular lesion in the periphery of the right lobe of the liver most compatible with a portal venous shunting. The gallbladder, pancreas, spleen, and the adrenal glands appear unremarkable. Stable appearing left renal hypodense lesions, likely cysts. Small left renal parapelvic cysts noted. There is a 3 mm nonobstructing right renal interpolar calculus. No hydronephrosis on either side. The visualized ureters appear unremarkable. There is diffuse thickening and trabecular appearance of the bladder wall compatible with chronic bladder outlet obstruction. The prostate gland is enlarged with median lobe hypertrophy. There is a large hiatal hernia with gastroesophageal reflux. Oral contrast opacifies the stomach and multiple loops of small bowel. There is no evidence of bowel obstruction or inflammation. There is apparent diffuse thickening of the wall of the colon predominantly involving the rectosigmoid which may be related to underdistention. Colitis is not excluded. Clinical correlation is recommended. There is sigmoid diverticulosis without active inflammation. Normal appendix. Advanced aortoiliac atherosclerotic disease. The origins of the celiac axis, SMA, IMA as well as the origins of the renal arteries appear patent. No portal venous gas identified. There is no  adenopathy. The abdominal wall soft tissues appear unremarkable. There is osteopenia with extensive degenerative changes of the spine. Old healed left posterior rib fractures. No acute fracture identified. There are bilateral L5 pars defects with grade 1 L5-S1 anterolisthesis. There is T12 compression fractures similar to prior study. IMPRESSION: Under distention versus colitis. Correlation with clinical exam and stool cultures recommended. No bowel obstruction. Normal appendix. Moderate-sized hiatal hernia. Diffuse bladder wall thickening sequela of chronic bladder outlet obstruction. Correlation with urinalysis recommended to exclude superimposed UTI. Other findings similar to the prior CT. Electronically Signed   By: AAnner CreteM.D.   On: 10/18/2015 01:02    Scheduled Meds: . ciprofloxacin  400 mg Intravenous Q12H  . donepezil  10 mg Oral QHS  . enoxaparin (LOVENOX) injection  30 mg Subcutaneous Q24H  . metronidazole  500 mg Intravenous Q8H  . multivitamin with minerals  1 tablet Oral Daily  . sertraline  25 mg Oral Daily   Continuous Infusions: . sodium chloride 75 mL/hr at 10/18/15 0430    Principal Problem:   Lower abdominal pain Active Problems:   Alzheimer's disease   Insomnia   GAIT DISTURBANCE   Colitis   H/O prostate cancer   Bladder wall  thickening   Generalized abdominal pain    Time spent: 35 minutes.     Niel Hummer A  [facility] Pager [phone]. If 7PM-7AM, please contact night-coverage at [email or web address], password [facility] 10/18/2015, 1:58 PM

## 2015-10-18 NOTE — H&P (Addendum)
[facility] History and Physical  [name] Rice UMP:[id number] DOB: 1924-12-21 DOA: 10/17/2015  Referring physician: ED PCP: Mauricio Po, FNP   Chief Complaint:  Lower abdominal pain  HPI:   [name] Rice is a 81 year old male with a past medical history significant for dementia, insomnia, prostate cancer, and previous right hernia repair;  Who presents with a five-day history of lower abdominal pain.  Patient history is given by his power of attorney is present in the room as the patient has history of dementia and is only able to provide parts of the history. Symptoms  have occurred intermittently and progressively worsened over the last 5 days. Pain was described as sharp in nature. His caregiver notes that he previously was having episodes of diarrhea, but his last bowel movement was approximately 3 days ago.   All patient has had this abdominal pain he has had associated symptoms of nausea and has vomited twice this week. His caregiver tried to give him ibuprofen but that did not help relieve symptoms. His caregiver noticed that he had decreased urine output as well as decreased appetite over the last 2 days.  Patient previously had a inguinal hernia repair on the right side back in  06/2011 with mesh.  He has been very tender to touch his lower abdomen.  Denies any fevers, sweats, blood in stool or urine.  Upon admission to the emergency department initial lab work was within normal limits, except for a lactic acid which was mildly elevated at 2.31, and the CT scan of the abdomen showed possible  colitis versus under distention.  Review of Systems  Constitutional: Positive for malaise/fatigue. Negative for chills.  HENT: Positive for hearing loss. Negative for ear pain.   Eyes: Negative for pain and discharge.  Respiratory: Positive for shortness of breath. Negative for hemoptysis.   Cardiovascular: Negative for palpitations and orthopnea.  Gastrointestinal: Positive for nausea, vomiting,  abdominal pain and diarrhea. Negative for blood in stool.  Genitourinary: Negative for frequency and hematuria.        Decreased urinary output  Musculoskeletal: Positive for joint pain.  Skin: Negative for itching and rash.  Neurological: Positive for weakness. Negative for speech change and focal weakness.  Endo/Heme/Allergies: Negative for environmental allergies. Bruises/bleeds easily.  Psychiatric/Behavioral: Positive for memory loss. Negative for hallucinations and substance abuse.     Past Medical History  Diagnosis Date  . Arthritis   . Cancer [facility])     prostate   . Stroke ([place])   . 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 Nasal congestion   . Hearing loss   . Difficulty urinating   . Weakness   . Dementia   . Diabetes mellitus without complication [facility])      Past Surgical History  Procedure Laterality Date  . Hernia repair  2012    Social History:  reports that he has been smoking.  He does not have any smokeless tobacco history on file. He reports that he drinks alcohol. He reports that he does not use illicit drugs. Where does patient live--home Can patient participate in ADLs? Yes, but need assistance and has a caregiver  No Known Allergies  Family History  Problem Relation Age of Onset  . Heart attack Father   . Diabetes Brother   . Hypertension Brother      Prior to Admission medications   Medication Sig Start Date End Date Taking? Authorizing Provider  Capsicum Oleoresin (ARTHRITIS PAIN RELIEF RUB EX)  Apply 1 application topically daily as needed (arthritis pain.).   Yes Historical Provider, MD  carbamide peroxide (DEBROX) 6.5 % otic solution Place 5 drops into both ears daily as needed (earwax removal.).   Yes Historical Provider, MD  docusate sodium (COLACE) 100 MG capsule Take 1 capsule (100 mg total) by mouth 2 (two) times daily as needed for mild constipation. 06/01/15  Yes Debby Freiberg, MD  donepezil (ARICEPT) 10 MG tablet Take 1  tablet (10 mg total) by mouth at bedtime. 06/01/15  Yes Debby Freiberg, MD  Eszopiclone (ESZOPICLONE) 3 MG TABS Take 3 mg by mouth at bedtime. Take immediately before bedtime   Yes Historical Provider, MD  ibuprofen (ADVIL,MOTRIN) 200 MG tablet Take 200 mg by mouth every 6 (six) hours as needed for mild pain or moderate pain.   Yes Historical Provider, MD  Multiple Vitamin (MULTIVITAMIN WITH MINERALS) TABS tablet Take 1 tablet by mouth daily.   Yes Historical Provider, MD  OVER THE COUNTER MEDICATION Place 1-2 drops into both eyes daily as needed (dry eyes.). Over the counter eye drops for dry eyes.   Yes Historical Provider, MD  sertraline (ZOLOFT) 25 MG tablet Take 1 tablet (25 mg total) by mouth daily. 06/01/15  Yes Debby Freiberg, MD  isosorbide mononitrate (IMDUR) 30 MG 24 hr tablet Take 1 tablet (30 mg total) by mouth daily. Patient not taking: Reported on 10/17/2015 07/26/15   Robbie Lis, MD  metoprolol succinate (TOPROL XL) 25 MG 24 hr tablet Take 1 tablet (25 mg total) by mouth daily. Patient not taking: Reported on 10/17/2015 07/26/15   Robbie Lis, MD     Physical Exam: Filed Vitals:   10/18/15 0045 10/18/15 0100 10/18/15 0200 10/18/15 0257  BP: 112/76 117/71 108/72 123/69  Pulse: 76 73 86 64  Temp: 98.5 F (36.9 C)   98.3 F (36.8 C)  TempSrc: Oral   Oral  Resp: 14   18  Height:    '5\' 5"'  (1.651 m)  Weight:    47.446 kg (104 lb 9.6 oz)  SpO2: 93% 93% 94% 99%     Constitutional: Vital signs reviewed. Patient is elderly male who appears to be in some mild discomfort, but able to cooperate with exam. Head: Normocephalic and atraumatic  Ear: TM normal bilaterally  Mouth: no erythema or exudates, MMM  Eyes: PERRL, EOMI, conjunctivae normal, No scleral icterus.  Neck: Supple, Trachea midline normal ROM, No JVD, mass, thyromegaly, or carotid bruit present.  Cardiovascular: RRR, S1 normal, S2 normal, no MRG, pulses symmetric and intact bilaterally  Pulmonary/Chest: CTAB, no  wheezes, rales, or rhonchi  Abdominal: Soft. Tender to palpation  most notably of the lower abdomen with some signs of guarding, non-distended, bowel sounds are normal, no masses, or organomegaly. Musculoskeletal: No joint deformities, erythema, or stiffness, ROM full and no nontender Ext: no edema and no cyanosis, pulses palpable bilaterally (DP and PT)  Hematology: no cervical, inginal, or axillary adenopathy.  Neurological: A&O x2, Strenght is normal and symmetric bilaterally, cranial nerve II-XII are grossly intact, no focal motor deficit, sensory intact to light touch bilaterally.  Skin: Warm, dry and intact. No rash, cyanosis, or clubbing.  Psychiatric: Normal mood and affect. speech and behavior is normal. Judgment and thought content normal. Cognition and memory are normal.      Data Review   Micro Results No results found for this or any previous visit (from the past 240 hour(s)).  Radiology Reports Ct Abdomen Pelvis W Contrast  10/18/2015  CLINICAL DATA:  81 year old male with abdominal pain and episodes of nausea vomiting and constipation. EXAM: CT ABDOMEN AND PELVIS WITH CONTRAST TECHNIQUE: Multidetector CT imaging of the abdomen and pelvis was performed using the standard protocol following bolus administration of intravenous contrast. CONTRAST:  88m OMNIPAQUE IOHEXOL 300 MG/ML  SOLN COMPARISON:  CT dated 06/01/2015 FINDINGS: Bibasilar subsegmental linear atelectasis/ scarring. The visualized lung bases are otherwise clear. There is coronary vascular calcification. No intra-abdominal free air or free fluid. Stable appearing left hepatic cystic lesion. Stable appearing lobulated vascular lesion in the periphery of the right lobe of the liver most compatible with a portal venous shunting. The gallbladder, pancreas, spleen, and the adrenal glands appear unremarkable. Stable appearing left renal hypodense lesions, likely cysts. Small left renal parapelvic cysts noted. There is a 3 mm  nonobstructing right renal interpolar calculus. No hydronephrosis on either side. The visualized ureters appear unremarkable. There is diffuse thickening and trabecular appearance of the bladder wall compatible with chronic bladder outlet obstruction. The prostate gland is enlarged with median lobe hypertrophy. There is a large hiatal hernia with gastroesophageal reflux. Oral contrast opacifies the stomach and multiple loops of small bowel. There is no evidence of bowel obstruction or inflammation. There is apparent diffuse thickening of the wall of the colon predominantly involving the rectosigmoid which may be related to underdistention. Colitis is not excluded. Clinical correlation is recommended. There is sigmoid diverticulosis without active inflammation. Normal appendix. Advanced aortoiliac atherosclerotic disease. The origins of the celiac axis, SMA, IMA as well as the origins of the renal arteries appear patent. No portal venous gas identified. There is no adenopathy. The abdominal wall soft tissues appear unremarkable. There is osteopenia with extensive degenerative changes of the spine. Old healed left posterior rib fractures. No acute fracture identified. There are bilateral L5 pars defects with grade 1 L5-S1 anterolisthesis. There is T12 compression fractures similar to prior study. IMPRESSION: Under distention versus colitis. Correlation with clinical exam and stool cultures recommended. No bowel obstruction. Normal appendix. Moderate-sized hiatal hernia. Diffuse bladder wall thickening sequela of chronic bladder outlet obstruction. Correlation with urinalysis recommended to exclude superimposed UTI. Other findings similar to the prior CT. Electronically Signed   By: AAnner CreteM.D.   On: 10/18/2015 01:02     CBC  Recent Labs Lab 10/17/15 1956  WBC 4.7  HGB 15.0  HCT 44.4  PLT 248  MCV 98.4  MCH 33.3  MCHC 33.8  RDW 13.9    Chemistries   Recent Labs Lab 10/17/15 1956  NA 141   K 3.5  CL 106  CO2 22  GLUCOSE 76  BUN 13  CREATININE 1.00  CALCIUM 9.2  AST 25  ALT 14*  ALKPHOS 50  BILITOT 0.7   ------------------------------------------------------------------------------------------------------------------ estimated creatinine clearance is 34.2 mL/min (by C-G formula based on Cr of 1). ------------------------------------------------------------------------------------------------------------------ No results for input(s): HGBA1C in the last 72 hours. ------------------------------------------------------------------------------------------------------------------ No results for input(s): CHOL, HDL, LDLCALC, TRIG, CHOLHDL, LDLDIRECT in the last 72 hours. ------------------------------------------------------------------------------------------------------------------ No results for input(s): TSH, T4TOTAL, T3FREE, THYROIDAB in the last 72 hours.  Invalid input(s): FREET3 ------------------------------------------------------------------------------------------------------------------ No results for input(s): VITAMINB12, FOLATE, FERRITIN, TIBC, IRON, RETICCTPCT in the last 72 hours.  Coagulation profile No results for input(s): INR, PROTIME in the last 168 hours.   Recent Labs  10/18/15 0158  DDIMER 0.41    Cardiac Enzymes No results for input(s): CKMB, TROPONINI, MYOGLOBIN in the last 168 hours.  Invalid input(s): CK ------------------------------------------------------------------------------------------------------------------ Invalid input(s): POCBNP   CBG:  No results for input(s): GLUCAP in the last 168 hours.     HRC:BULAGTX   Assessment/Plan Principal Problem:    Lower abdominal pain:  Acute.  Patient reporting family history moreso lower abdominal pain.  Physical exam reveals generalized tenderness but most notably of the lower abdomen.   CT scan showing the possibility of colitis versus under distention.  There is note of  advanced  aterioiliac atherosclerotic disease.  Differential includes  Colitis versus mesenteric ischemia versus incarcerated bowel/hernia  versus possible generalized gastroenteritis. -  Admit to a MedSurg bed -  IV fluids NS at 75 cc/hr -  Morphine for pain control -  NPO for now advance  -  check ESR/CRP -  Check d-dimer/amylase/ Vascular duplex ultrasound of abdomen for possibility of mesenteric ischemia because the patient received contrast dye once with CT of abdomen.  However best study for  evaluation of this would be a CT angiogram. -  Pelvis ultrasound to evaluate the possibility of a  hernia  H/O prostate cancer: Patient was reported to be treated with some medication    Bladder wall thickening:  As seen on CT. May need further work-up  Alzheimer's disease - continue Aricept   Insomnia - therapeutic substitution for home medication with Ambien    History of gait disturbance - PT  To eval and treat  Hx of  Diabetes mellitus type 2 diet-controlled - continue to monitor    Code Status:   full Family Communication: bedside Disposition Plan: admit   Total time spent 55 minutes.Greater than 50% of this time was spent in counseling, explanation of diagnosis, planning of further management, and coordination of care  [facility] Pager [phone]  If 7PM-7AM, please contact night-coverage [email or web address] Password [email or web address] 10/18/2015, 3:08 AM

## 2015-10-19 LAB — BASIC METABOLIC PANEL
ANION GAP: 8 (ref 5–15)
BUN: <5 mg/dL — ABNORMAL LOW (ref 6–20)
CALCIUM: 7.9 mg/dL — AB (ref 8.9–10.3)
CO2: 26 mmol/L (ref 22–32)
Chloride: 106 mmol/L (ref 101–111)
Creatinine, Ser: 0.7 mg/dL (ref 0.61–1.24)
GFR calc non Af Amer: >60 mL/min (ref >60)
Glucose, Bld: 105 mg/dL — ABNORMAL HIGH (ref 65–99)
Potassium: 2.9 mmol/L — ABNORMAL LOW (ref 3.5–5.1)
Sodium: 140 mmol/L (ref 135–145)

## 2015-10-19 LAB — CBC
HCT: 38.6 % — ABNORMAL LOW (ref 39.0–52.0)
HEMOGLOBIN: 13.1 g/dL (ref 13.0–17.0)
MCH: 32.8 pg (ref 26.0–34.0)
MCHC: 33.9 g/dL (ref 30.0–36.0)
MCV: 96.7 fL (ref 78.0–100.0)
Platelets: 195 10*3/uL (ref 150–400)
RBC: 3.99 MIL/uL — AB (ref 4.22–5.81)
RDW: 13.5 % (ref 11.5–15.5)
WBC: 3.6 10*3/uL — ABNORMAL LOW (ref 4.0–10.5)

## 2015-10-19 MED ORDER — NICOTINE 21 MG/24HR TD PT24
21.0000 mg | MEDICATED_PATCH | Freq: Every day | TRANSDERMAL | Status: DC
  Administered 2015-10-19 – 2015-10-23 (×5): 21 mg via TRANSDERMAL
  Filled 2015-10-19 (×5): qty 1

## 2015-10-19 MED ORDER — METRONIDAZOLE 500 MG PO TABS
500.0000 mg | ORAL_TABLET | Freq: Three times a day (TID) | ORAL | Status: DC
  Administered 2015-10-19 – 2015-10-23 (×12): 500 mg via ORAL
  Filled 2015-10-19 (×11): qty 1

## 2015-10-19 MED ORDER — CIPROFLOXACIN HCL 500 MG PO TABS
500.0000 mg | ORAL_TABLET | Freq: Two times a day (BID) | ORAL | Status: DC
  Administered 2015-10-19 – 2015-10-23 (×9): 500 mg via ORAL
  Filled 2015-10-19 (×9): qty 1

## 2015-10-19 MED ORDER — HALOPERIDOL 0.5 MG PO TABS
0.5000 mg | ORAL_TABLET | Freq: Two times a day (BID) | ORAL | Status: DC | PRN
  Administered 2015-10-20 (×2): 0.5 mg via ORAL
  Filled 2015-10-19 (×4): qty 1

## 2015-10-19 MED ORDER — NICOTINE POLACRILEX 2 MG MT GUM
2.0000 mg | CHEWING_GUM | OROMUCOSAL | Status: DC | PRN
  Filled 2015-10-19: qty 1

## 2015-10-19 MED ORDER — POTASSIUM CHLORIDE CRYS ER 20 MEQ PO TBCR
40.0000 meq | EXTENDED_RELEASE_TABLET | ORAL | Status: AC
  Administered 2015-10-19 (×2): 40 meq via ORAL
  Filled 2015-10-19: qty 2

## 2015-10-19 NOTE — Care Management Note (Addendum)
Case Management Note  Patient Details  Name: [name] Rice MRN: [id number] Date of Birth: 1925/02/26  Subjective/Objective:          CM following for progression and d/c planning.          Action/Plan: Noted consult for HH, await PT eval and recommendations. Please order as appropriate. CM will arrange when needs identified.   Expected Discharge Date:    10/20/2015              Expected Discharge Plan:  [place]  In-House Referral:  NA  Discharge planning Services  CM Consult  Post Acute Care Choice:    Choice offered to:     DME Arranged:    DME Agency:     HH Arranged:    HH Agency:     Status of Service:  In process, will continue to follow  Medicare Important Message Given:    Date Medicare IM Given:    Medicare IM give by:    Date Additional Medicare IM Given:    Additional Medicare Important Message give by:     If discussed at [place] of Stay Meetings, dates discussed:    Additional Comments:  Adron Bene, RN 10/19/2015, 10:56 AM

## 2015-10-19 NOTE — Progress Notes (Signed)
[facility] PROGRESS NOTE  [name] Rice MVH:[id number] DOB: 01-Aug-1925 DOA: 10/17/2015 PCP: No primary care provider on file.  Assessment/Plan: [name] Rice is a 81 year old male with a past medical history significant for dementia, insomnia, prostate cancer, and previous right hernia repair; Who presents with a five-day history of lower abdominal pain. Patient history is given by his power of attorney is present in the room as the patient has history of dementia and is only able to provide parts of the history. Symptoms have occurred intermittently and progressively worsened over the last 5 days. Pain was described as sharp in nature. His caregiver notes that he previously was having episodes of diarrhea, but his last bowel movement was approximately 3 days ago  Abdominal Pain: Colitis Vs UTI/  CT with possible colitis.  ESR, CRP no significant elevated. D dimer negative.  [place] negative for inguinal hernia.  Continue  ciprofloxacin  and flagyl.  Follow urine culture.  Will advance diet to Full liquid diet.  He will need pain medications at discharge.   H/O prostate cancer: Patient was reported to be treated with some medication   Bladder wall thickening: As seen on CT. May need further work-up out patient, POA aware. . Will check urine culture.  left epididymis shows a 1.5 cm cyst or spermatocele; needs to follow up with urology, POA aware.   Alzheimer's disease - continue Aricept   Insomnia - therapeutic substitution for home medication with Ambien   History of gait disturbance - PT To eval and treat  Hx of Diabetes mellitus type 2 diet-controlled - continue to monitor   Code Status: DNR Family Communication: care discussed with [name] Rice who is POA Disposition Plan: remain inpatient, PT evaluation, advance diet today, follow urine culture, home in 24 to 48 hours depending on symptoms.    Consultants:  none  Procedures:  [place]  Antibiotics:  cipro  12-21  Flagyl 12-21  HPI/Subjective: He relates suprapubic pain is better. Is not worse with meals.  POA relates patient complain of pain during nights, she would like pain medications at discharge/   Objective: Filed Vitals:   10/19/15 0605 10/19/15 0848  BP: 150/95 118/71  Pulse: 77 70  Temp: 98.1 F (36.7 C) 98.3 F (36.8 C)  Resp: 18 17    Intake/Output Summary (Last 24 hours) at 10/19/15 0939 Last data filed at 10/19/15 0600  Gross per 24 hour  Intake 2652.5 ml  Output   1250 ml  Net 1402.5 ml   Filed Weights   10/18/15 0257 10/18/15 2133  Weight: 47.446 kg (104 lb 9.6 oz) 47.174 kg (104 lb)    Exam:   General:  Alert in no distress  Cardiovascular: S 1, S 2 RRR  Respiratory: CTA  Abdomen: BS present, soft, mild supra-pubic tenderness  Musculoskeletal: no edema  Data Reviewed: Basic Metabolic Panel:  Recent Labs Lab 10/17/15 1956 10/19/15 0730  NA 141 140  K 3.5 2.9*  CL 106 106  CO2 22 26  GLUCOSE 76 105*  BUN 13 <5*  CREATININE 1.00 0.70  CALCIUM 9.2 7.9*   Liver Function Tests:  Recent Labs Lab 10/17/15 1956  AST 25  ALT 14*  ALKPHOS 50  BILITOT 0.7  PROT 6.6  ALBUMIN 3.9    Recent Labs Lab 10/17/15 1956 10/18/15 0158  LIPASE 24  --   AMYLASE  --  43   No results for input(s): AMMONIA in the last 168 hours. CBC:  Recent Labs Lab 10/17/15 1956  WBC  4.7  HGB 15.0  HCT 44.4  MCV 98.4  PLT 248   Cardiac Enzymes: No results for input(s): CKTOTAL, CKMB, CKMBINDEX, TROPONINI in the last 168 hours. BNP (last 3 results)  Recent Labs  07/25/15 1640  BNP 65.6    ProBNP (last 3 results) No results for input(s): PROBNP in the last 8760 hours.  CBG: No results for input(s): GLUCAP in the last 168 hours.  No results found for this or any previous visit (from the past 240 hour(s)).   Studies: US Pelvis Limited  10/18/2015  CLINICAL DATA:  Lower abdominal pain.  Question inguinal hernia. EXAM: LIMITED ULTRASOUND  OF PELVIS TECHNIQUE: Limited transabdominal ultrasound examination of the pelvis was performed. COMPARISON:  CT abdomen pelvis 10/18/2015. FINDINGS: No inguinal hernia. No solid or cystic mass. Incidental imaging of the left epididymis shows a 1.5 cm cyst or spermatocele. IMPRESSION: No evidence of an inguinal hernia. Electronically Signed   By: Lorin Picket M.D.   On: 10/18/2015 08:08   Ct Abdomen Pelvis W Contrast  10/18/2015  CLINICAL DATA:  81 year old male with abdominal pain and episodes of nausea vomiting and constipation. EXAM: CT ABDOMEN AND PELVIS WITH CONTRAST TECHNIQUE: Multidetector CT imaging of the abdomen and pelvis was performed using the standard protocol following bolus administration of intravenous contrast. CONTRAST:  70m OMNIPAQUE IOHEXOL 300 MG/ML  SOLN COMPARISON:  CT dated 06/01/2015 FINDINGS: Bibasilar subsegmental linear atelectasis/ scarring. The visualized lung bases are otherwise clear. There is coronary vascular calcification. No intra-abdominal free air or free fluid. Stable appearing left hepatic cystic lesion. Stable appearing lobulated vascular lesion in the periphery of the right lobe of the liver most compatible with a portal venous shunting. The gallbladder, pancreas, spleen, and the adrenal glands appear unremarkable. Stable appearing left renal hypodense lesions, likely cysts. Small left renal parapelvic cysts noted. There is a 3 mm nonobstructing right renal interpolar calculus. No hydronephrosis on either side. The visualized ureters appear unremarkable. There is diffuse thickening and trabecular appearance of the bladder wall compatible with chronic bladder outlet obstruction. The prostate gland is enlarged with median lobe hypertrophy. There is a large hiatal hernia with gastroesophageal reflux. Oral contrast opacifies the stomach and multiple loops of small bowel. There is no evidence of bowel obstruction or inflammation. There is apparent diffuse thickening of  the wall of the colon predominantly involving the rectosigmoid which may be related to underdistention. Colitis is not excluded. Clinical correlation is recommended. There is sigmoid diverticulosis without active inflammation. Normal appendix. Advanced aortoiliac atherosclerotic disease. The origins of the celiac axis, SMA, IMA as well as the origins of the renal arteries appear patent. No portal venous gas identified. There is no adenopathy. The abdominal wall soft tissues appear unremarkable. There is osteopenia with extensive degenerative changes of the spine. Old healed left posterior rib fractures. No acute fracture identified. There are bilateral L5 pars defects with grade 1 L5-S1 anterolisthesis. There is T12 compression fractures similar to prior study. IMPRESSION: Under distention versus colitis. Correlation with clinical exam and stool cultures recommended. No bowel obstruction. Normal appendix. Moderate-sized hiatal hernia. Diffuse bladder wall thickening sequela of chronic bladder outlet obstruction. Correlation with urinalysis recommended to exclude superimposed UTI. Other findings similar to the prior CT. Electronically Signed   By: AAnner CreteM.D.   On: 10/18/2015 01:02    Scheduled Meds: . ciprofloxacin  400 mg Intravenous Q12H  . docusate sodium  100 mg Oral BID  . donepezil  10 mg Oral QHS  . enoxaparin (  LOVENOX) injection  30 mg Subcutaneous Q24H  . metronidazole  500 mg Intravenous Q8H  . multivitamin with minerals  1 tablet Oral Daily  . potassium chloride  40 mEq Oral Q4H  . sertraline  25 mg Oral Daily   Continuous Infusions: . sodium chloride 75 mL/hr at 10/18/15 0430    Principal Problem:   Lower abdominal pain Active Problems:   Alzheimer's disease   Insomnia   GAIT DISTURBANCE   Colitis   H/O prostate cancer   Bladder wall thickening   Generalized abdominal pain   Abdominal pain    Time spent: 35 minutes.     Niel Hummer A  [facility] Pager [phone]. If 7PM-7AM, please contact night-coverage at [email or web address], password [facility] 10/19/2015, 9:39 AM  LOS: 1 day

## 2015-10-19 NOTE — Progress Notes (Signed)
Pt currently confused. Pt refusing IV restart, pt states that he is going home today. Pt reassured by RN that he will not be discharged today according to MD. Pt still refusing and states that he is leaving. Pt's caregiver made aware. MD paged. IV meds changed to PO. Will continue to monitor pt.    [name] Rice

## 2015-10-20 DIAGNOSIS — K529 Noninfective gastroenteritis and colitis, unspecified: Principal | ICD-10-CM

## 2015-10-20 DIAGNOSIS — N3289 Other specified disorders of bladder: Secondary | ICD-10-CM

## 2015-10-20 DIAGNOSIS — R1030 Lower abdominal pain, unspecified: Secondary | ICD-10-CM

## 2015-10-20 LAB — CBC
HCT: 41.7 % (ref 39.0–52.0)
Hemoglobin: 14.2 g/dL (ref 13.0–17.0)
MCH: 32.9 pg (ref 26.0–34.0)
MCHC: 34.1 g/dL (ref 30.0–36.0)
MCV: 96.8 fL (ref 78.0–100.0)
PLATELETS: 191 10*3/uL (ref 150–400)
RBC: 4.31 MIL/uL (ref 4.22–5.81)
RDW: 13.4 % (ref 11.5–15.5)
WBC: 4.9 10*3/uL (ref 4.0–10.5)

## 2015-10-20 LAB — URINE CULTURE

## 2015-10-20 LAB — BASIC METABOLIC PANEL
ANION GAP: 8 (ref 5–15)
BUN: <5 mg/dL — ABNORMAL LOW (ref 6–20)
CO2: 28 mmol/L (ref 22–32)
Calcium: 8.8 mg/dL — ABNORMAL LOW (ref 8.9–10.3)
Chloride: 101 mmol/L (ref 101–111)
Creatinine, Ser: 0.7 mg/dL (ref 0.61–1.24)
Glucose, Bld: 149 mg/dL — ABNORMAL HIGH (ref 65–99)
POTASSIUM: 3.9 mmol/L (ref 3.5–5.1)
SODIUM: 137 mmol/L (ref 135–145)

## 2015-10-20 NOTE — Evaluation (Signed)
Occupational Therapy Evaluation Patient Details Name: [name] Rice MRN: [id number] DOB: 04-Aug-1925 Today's Date: 10/20/2015    History of Present Illness 81 y.o. male admitted for Abdominal Pain: Colitis Vs UTI. Hx of alzheimer's disease.   Clinical Impression   PT admitted with abdominal pain. Pt currently with functional limitiations due to the deficits listed below (see OT problem list). PTA from home alone with RN coming out several times a week to (A) with medication. Pt with cognitive deficits and very agitated by OT arrival. Pt terminating task quickly and adamant about leaving ASAP. Pt will benefit from skilled OT to increase their independence and safety with adls and balance to allow discharge [place].     Follow Up Recommendations  Home health OT    Equipment Recommendations  None recommended by OT    Recommendations for Other Services       Precautions / Restrictions Precautions Precautions: Fall Restrictions Weight Bearing Restrictions: No      Mobility Bed Mobility Overal bed mobility: Independent                Transfers Overall transfer level: Needs assistance Equipment used: None Transfers: Sit to/from Stand Sit to Stand: Min guard         General transfer comment: supervision for safety. minimal sway noted but able to self correct.    Balance Overall balance assessment: Needs assistance Sitting-balance support: No upper extremity supported;Feet supported Sitting balance-Leahy Scale: Good     Standing balance support: No upper extremity supported;During functional activity Standing balance-Leahy Scale: Poor                 High Level Balance Comments: shower transfer ( single leg standing) demonstates LOB. Pt with head turns talking to therapist LOB            ADL Overall ADL's : Needs assistance/impaired                         Toilet Transfer: Min guard       Tub/ Shower Transfer: Walk-in shower;Minimal  assistance;Cueing for Immunologist Details (indicate cue type and reason): Pt with x1 LOB ambulating to the bathroom and then LOB exiting the shower. pt states "i am fine. I am 81 yo " "I have been doing this all my life. I dont understand all this. this is the hardest dang blame place to ever get out of i have ever seen in my whole life. I got what i need" Functional mobility during ADLs: Minimal assistance (due to LOB) General ADL Comments: Pt complete bathroom transfer with LOB x2 during session. Pt very adament about leaving today and now.     Vision     Perception     Praxis      Pertinent Vitals/Pain Pain Assessment: No/denies pain     Hand Dominance Right   Extremity/Trunk Assessment Upper Extremity Assessment Upper Extremity Assessment: Generalized weakness   Lower Extremity Assessment Lower Extremity Assessment: Defer to PT evaluation   Cervical / Trunk Assessment Cervical / Trunk Assessment: Kyphotic   Communication Communication Communication: No difficulties   Cognition Arousal/Alertness: Awake/alert Behavior During Therapy: Agitated Overall Cognitive Status: Impaired/Different from baseline Area of Impairment: Memory               General Comments: pt states "they were suppose to take me home an Hour ago. where are they? She [place] key ready to  let me in. I am ready to go" pt did remember working with PT and states "i just walked" Ot attempting to educate patient on the difference between OT / PT. Pt fixated on "he said i was good!"   General Comments       Exercises       Shoulder Instructions      Home Living Family/patient expects to be discharged to:: Private residence Living Arrangements: Alone Available Help at Discharge: Family;Available PRN/intermittently;Friend(s) Type of Home: Mobile home Home Access: Stairs to enter Entrance Stairs-Number of Steps: 3 Entrance Stairs-Rails:  Right;Left Home Layout: One level     Bathroom Shower/Tub: Walk-in Hydrologist: Standard     Home Equipment: Cane - single point;Grab bars - tub/shower;Shower seat - built in;Wheelchair - manual   Additional Comments: pt reports "I have everything and a nurse" pt unable to verbalize when RN comes and reports bathing in shower without (A)      Prior Functioning/Environment Level of Independence: Independent        Comments: RN checks vitals and medications    OT Diagnosis: Generalized weakness;Cognitive deficits   OT Problem List: Decreased activity tolerance;Impaired balance (sitting and/or standing);Decreased cognition;Decreased safety awareness;Decreased knowledge of use of DME or AE;Decreased knowledge of precautions   OT Treatment/Interventions: Self-care/ADL training;Therapeutic exercise;DME and/or AE instruction;Therapeutic activities;Cognitive remediation/compensation;Patient/family education;Balance training    OT Goals(Current goals can be found in the care plan section) Acute Rehab OT Goals Patient Stated Goal: Go home OT Goal Formulation: Patient unable to participate in goal setting Time For Goal Achievement: 11/03/15 Potential to Achieve Goals: Good  OT Frequency: Min 2X/week   Barriers to D/C: Decreased caregiver support  lives alone in a trailer       Co-evaluation              End of Session Nurse Communication: Mobility status;Precautions  Activity Tolerance: Patient tolerated treatment well Patient left: in bed;with call bell/phone within reach;with bed alarm set   Time: [id number] OT Time Calculation (min): 8 min Charges:  OT General Charges $OT Visit: 1 Procedure OT Evaluation $Initial OT Evaluation Tier I: 1 Procedure G-Codes:    [name] Rice [name] Rice 17, 2017, 12:29 PM  [name] Rice   OTR/L [phone] Office: [phone] .

## 2015-10-20 NOTE — Progress Notes (Signed)
[facility] PROGRESS NOTE  [name] Rice:[id number] DOB: 02-03-1925 DOA: 10/17/2015 PCP: No primary care provider on file.  Assessment/Plan: [name] is a 81 year old male with a past medical history significant for dementia, insomnia, prostate cancer, and previous right hernia repair; Who presents with a five-day history of lower abdominal pain. Patient history is given by his power of attorney is present in the room as the patient has history of dementia and is only able to provide parts of the history. Symptoms have occurred intermittently and progressively worsened over the last 5 days. Pain was described as sharp in nature. His caregiver notes that he previously was having episodes of diarrhea, but his last bowel movement was approximately 3 days ago  Abdominal Pain: Colitis Vs UTI/  CT with possible colitis.  ESR, CRP no significant elevated. D dimer negative.  [place] negative for inguinal hernia.  Continue  ciprofloxacin  and flagyl.  Urine cultures negative.  Advance diet as tolerated.  He will need pain medications at discharge.   H/O prostate cancer: Patient was reported to be treated with some medication    Bladder wall thickening: As seen on CT. May need further work-up out patient, POA aware. . Urine cultures are negative.   left epididymis shows a 1.5 cm cyst or spermatocele; needs to follow up with urology, POA aware.   Alzheimer's disease - continue Aricept   Insomnia - therapeutic substitution for home medication with Ambien   History of gait disturbance - PT recommending SNF.   Hx of Diabetes mellitus type 2 diet-controlled - continue to monitor CBG (last 3)  No results for input(s): GLUCAP in the last 72 hours.     Code Status: DNR Family Communication: care discussed with [name] who is POA Disposition Plan: pending PT eval, possibly in 1 to 2 days.    Consultants:  none  Procedures:  [place]  Antibiotics:  cipro  12-21  Flagyl 12-21  HPI/Subjective: NO NEW complaints , wants to know when he can go home.   Objective: Filed Vitals:   10/20/15 1001 10/20/15 1737  BP: 106/66 111/58  Pulse: 89 72  Temp: 98 F (36.7 C) 98.2 F (36.8 C)  Resp: 18 20    Intake/Output Summary (Last 24 hours) at 10/20/15 1814 Last data filed at 10/20/15 1441  Gross per 24 hour  Intake    600 ml  Output    150 ml  Net    450 ml   Filed Weights   10/18/15 0257 10/18/15 2133 10/19/15 2119  Weight: 47.446 kg (104 lb 9.6 oz) 47.174 kg (104 lb) 47.991 kg (105 lb 12.8 oz)    Exam:   General:  Alert in no distress  Cardiovascular: S 1, S 2 RRR  Respiratory: CTA  Abdomen: BS present, soft, mild supra-pubic tenderness  Musculoskeletal: no edema  Data Reviewed: Basic Metabolic Panel:  Recent Labs Lab 10/17/15 1956 10/19/15 0730 10/20/15 0615  NA 141 140 137  K 3.5 2.9* 3.9  CL 106 106 101  CO2 '22 26 28  ' GLUCOSE 76 105* 149*  BUN 13 <5* <5*  CREATININE 1.00 0.70 0.70  CALCIUM 9.2 7.9* 8.8*   Liver Function Tests:  Recent Labs Lab 10/17/15 1956  AST 25  ALT 14*  ALKPHOS 50  BILITOT 0.7  PROT 6.6  ALBUMIN 3.9    Recent Labs Lab 10/17/15 1956 10/18/15 0158  LIPASE 24  --   AMYLASE  --  43   No results for input(s): AMMONIA  in the last 168 hours. CBC:  Recent Labs Lab 10/17/15 1956 10/19/15 0930 10/20/15 0615  WBC 4.7 3.6* 4.9  HGB 15.0 13.1 14.2  HCT 44.4 38.6* 41.7  MCV 98.4 96.7 96.8  PLT 248 195 191   Cardiac Enzymes: No results for input(s): CKTOTAL, CKMB, CKMBINDEX, TROPONINI in the last 168 hours. BNP (last 3 results)  Recent Labs  07/25/15 1640  BNP 65.6    ProBNP (last 3 results) No results for input(s): PROBNP in the last 8760 hours.  CBG: No results for input(s): GLUCAP in the last 168 hours.  Recent Results (from the past 240 hour(s))  Urine culture     Status: None   Collection Time: 10/18/15  6:22 PM  Result Value Ref Range Status   Specimen  Description URINE, CLEAN CATCH  Final   Special Requests NONE  Final   Culture 2,000 COLONIES/mL INSIGNIFICANT GROWTH  Final   Report Status 10/20/2015 FINAL  Final     Studies: No results found.  Scheduled Meds: . ciprofloxacin  500 mg Oral BID  . docusate sodium  100 mg Oral BID  . donepezil  10 mg Oral QHS  . enoxaparin (LOVENOX) injection  30 mg Subcutaneous Q24H  . metroNIDAZOLE  500 mg Oral 3 times per day  . multivitamin with minerals  1 tablet Oral Daily  . nicotine  21 mg Transdermal Daily  . sertraline  25 mg Oral Daily   Continuous Infusions: . sodium chloride 75 mL/hr at 10/18/15 0430    Principal Problem:   Lower abdominal pain Active Problems:   Alzheimer's disease   Insomnia   GAIT DISTURBANCE   Colitis   H/O prostate cancer   Bladder wall thickening   Generalized abdominal pain   Abdominal pain    Time spent: 35 minutes.     [facility] Pager [phone] If 7PM-7AM, please contact night-coverage at [email or web address], password [email or web address] 10/20/2015, 6:14 PM  LOS: 2 days

## 2015-10-20 NOTE — Progress Notes (Signed)
Physical Therapy Evaluation Patient Details Name: [name] Rice MRN: [id number] DOB: July 29, 1925 Today's Date: 10/20/2015   History of Present Illness  81 y.o. male admitted for Abdominal Pain: Colitis Vs UTI. Hx of alzheimer's disease.  Clinical Impression  Pt admitted with the above complications. Pt currently with functional limitations due to the deficits listed below (see PT Problem List). Tolerated gait training up to 300 feet today including higher level dynamic gait tasks, demonstrating intermittent staggering but able to self correct without physical assistance. Encouraged to use a cane despite pt reporting "it's annoying." Would benefit from HHPT to further progress balance. Pt will benefit from skilled PT to increase their independence and safety with mobility to allow discharge to the venue listed below.       Follow Up Recommendations Home health PT;Supervision - Intermittent - If pt requires skilled care placement due to hx of alzheimer's, he would benefit from physical therapy at SNF     Equipment Recommendations   (Encouraged to use his cane at home)    Recommendations for Other Services OT consult     Precautions / Restrictions Precautions Precautions: Fall Restrictions Weight Bearing Restrictions: No      Mobility  Bed Mobility Overal bed mobility: Independent                Transfers Overall transfer level: Needs assistance Equipment used: None Transfers: Sit to/from Stand Sit to Stand: Supervision         General transfer comment: supervision for safety. minimal sway noted but able to self correct.  Ambulation/Gait Ambulation/Gait assistance: Min guard Ambulation Distance (Feet): 300 Feet Assistive device: None Gait Pattern/deviations: Step-through pattern;Decreased stride length;Staggering left;Staggering right   Gait velocity interpretation: at or above normal speed for age/gender General Gait Details: Initially holding furniture for support  but able to progress away from this external assistance. Demonstrates intermittent staggering to Lt and right, especially when distracted. He is able to self correct each time without physical assistance. VC for awareness and to take his time with gait. Tolerated higher level dynamic gait tasks with some difficulty but no need for physical assist; high marching, quick turns, backwards stepping, and variable speeds.  Stairs            Wheelchair Mobility    Modified Rankin (Stroke Patients Only)       Balance Overall balance assessment: Needs assistance Sitting-balance support: No upper extremity supported;Feet supported Sitting balance-Leahy Scale: Good     Standing balance support: No upper extremity supported Standing balance-Leahy Scale: Good                               Pertinent Vitals/Pain Pain Assessment: No/denies pain    Home Living Family/patient expects to be discharged to:: Private residence Living Arrangements: Alone Available Help at Discharge: Family;Available PRN/intermittently;Friend(s) (daughter visits 1x/week, RN 3x/week) Type of Home: Mobile home Home Access: Stairs to enter Entrance Stairs-Rails: Right;Left Entrance Stairs-Number of Steps: 3 Home Layout: One level Home Equipment: Cane - single point;Grab bars - tub/shower;Shower seat - built in;Wheelchair - manual      Prior Function Level of Independence: Independent         Comments: RN checks vitals and medications     Hand Dominance   Dominant Hand: Right    Extremity/Trunk Assessment   Upper Extremity Assessment: Defer to OT evaluation           Lower Extremity Assessment: Generalized weakness  Communication   Communication: No difficulties  Cognition Arousal/Alertness: Awake/alert Behavior During Therapy: WFL for tasks assessed/performed Overall Cognitive Status: Within Functional Limits for tasks assessed                      General  Comments General comments (skin integrity, edema, etc.): pt oriented x4    Exercises        Assessment/Plan    PT Assessment Patient needs continued PT services  PT Diagnosis Abnormality of gait;Generalized weakness   PT Problem List Decreased strength;Decreased balance;Decreased mobility;Decreased knowledge of use of DME  PT Treatment Interventions DME instruction;Gait training;Stair training;Functional mobility training;Therapeutic activities;Therapeutic exercise;Balance training;Neuromuscular re-education;Patient/family education   PT Goals (Current goals can be found in the Care Plan section) Acute Rehab PT Goals Patient Stated Goal: Go home PT Goal Formulation: With patient Time For Goal Achievement: 11/03/15 Potential to Achieve Goals: Good    Frequency Min 3X/week   Barriers to discharge Decreased caregiver support lives alone but has assist from aide per pt report    Co-evaluation               End of Session Equipment Utilized During Treatment: Gait belt Activity Tolerance: Patient tolerated treatment well Patient left: in bed;with call bell/phone within reach;with bed alarm set Nurse Communication: Mobility status         Time: 0945-1000 PT Time Calculation (min) (ACUTE ONLY): 15 min   Charges:   PT Evaluation $Initial PT Evaluation Tier I: 1 Procedure     PT G CodesEllouise Newer 10/20/2015, 10:34 AM  Elayne Snare, [place]

## 2015-10-20 NOTE — Care Management Note (Addendum)
Case Management Note  Patient Details  Name: [name] Rice MRN: [id number] Date of Birth: Jul 15, 1925  Subjective/Objective:                    Action/Plan: 12/11/30/2014 Continuing to follow, noted PT and OT recommending SNF, await orders, will assist with [facility] if ordered.   Expected Discharge Date:                  Expected Discharge Plan:  [place]  In-House Referral:  NA  Discharge planning Services  CM Consult  Post Acute Care Choice:    Choice offered to:     DME Arranged:    DME Agency:     HH Arranged:    HH Agency:     Status of Service:  In process, will continue to follow  Medicare Important Message Given:    Date Medicare IM Given:    Medicare IM give by:    Date Additional Medicare IM Given:    Additional Medicare Important Message give by:     If discussed at [place] of Stay Meetings, dates discussed:    Additional Comments:  Adron Bene, RN 10/20/2015, 4:07 PM

## 2015-10-21 DIAGNOSIS — R1084 Generalized abdominal pain: Secondary | ICD-10-CM

## 2015-10-21 NOTE — Progress Notes (Signed)
[facility] PROGRESS NOTE  [name] Rice [id number] DOB: 06/01/1925 DOA: 10/17/2015 PCP: No primary care provider on file.  Assessment/Plan: [name] Rice is a 81 year old male with a past medical history significant for dementia, insomnia, prostate cancer, and previous right hernia repair; Who presents with a five-day history of lower abdominal pain. Patient history is given by his power of attorney is present in the room as the patient has history of dementia and is only able to provide parts of the history. Symptoms have occurred intermittently and progressively worsened over the last 5 days.    Abdominal Pain: Colitis Vs UTI/  CT with possible colitis.  ESR, CRP no significant elevated. D dimer negative.  [place] negative for inguinal hernia.  Continue  ciprofloxacin  and flagyl.  Urine cultures negative.  Advance diet as tolerated.  He will need pain medications at discharge.   H/O prostate cancer: Patient was reported to be treated with some medication    Bladder wall thickening: As seen on CT. May need further work-up out patient, POA aware. . Urine cultures are negative.   left epididymis shows a 1.5 cm cyst or spermatocele; needs to follow up with urology, POA aware.   Alzheimer's disease - continue Aricept   Insomnia - therapeutic substitution for home medication with Ambien   History of gait disturbance - PT recommending SNF.   Hx of Diabetes mellitus type 2 diet-controlled - continue to monitor CBG (last 3)  No results for input(s): GLUCAP in the last 72 hours.     Code Status: DNR Family Communication: care discussed with [name] Rice who is POA Disposition Plan: SNF on Monday.   Consultants:  none  Procedures:  [place]  Antibiotics:  cipro 12-21  Flagyl 12-21  HPI/Subjective: NO NEW complaints , wants to know when he can go home.   Objective: Filed Vitals:   10/21/15 0838 10/21/15 1644  BP: 122/56 114/62  Pulse: 88 84  Temp: 97.5  F (36.4 C) 98.7 F (37.1 C)  Resp: 20 19    Intake/Output Summary (Last 24 hours) at 10/21/15 1853 Last data filed at 10/21/15 1800  Gross per 24 hour  Intake    720 ml  Output      0 ml  Net    720 ml   Filed Weights   10/18/15 0257 10/18/15 2133 10/19/15 2119  Weight: 47.446 kg (104 lb 9.6 oz) 47.174 kg (104 lb) 47.991 kg (105 lb 12.8 oz)    Exam:   General:  Alert in no distress  Cardiovascular: S 1, S 2 RRR  Respiratory: CTA  Abdomen: BS present, soft, mild supra-pubic tenderness  Musculoskeletal: no edema  Data Reviewed: Basic Metabolic Panel:  Recent Labs Lab 10/17/15 1956 10/19/15 0730 10/20/15 0615  NA 141 140 137  K 3.5 2.9* 3.9  CL 106 106 101  CO2 _0 GLUCOSE 76 105* 149*  BUN 13 <5* <5*  CREATININE 1.00 0.70 0.70  CALCIUM 9.2 7.9* 8.8*   Liver Function Tests:  Recent Labs Lab 10/17/15 1956  AST 25  ALT 14*  ALKPHOS 50  BILITOT 0.7  PROT 6.6  ALBUMIN 3.9    Recent Labs Lab 10/17/15 1956 10/18/15 0158  LIPASE 24  --   AMYLASE  --  43   No results for input(s): AMMONIA in the last 168 hours. CBC:  Recent Labs Lab 10/17/15 1956 10/19/15 0930 10/20/15 0615  WBC 4.7 3.6* 4.9  HGB 15.0 13.1 14.2  HCT 44.4 38.6* 41.7  MCV 98.4 96.7 96.8  PLT 248 195 191   Cardiac Enzymes: No results for input(s): CKTOTAL, CKMB, CKMBINDEX, TROPONINI in the last 168 hours. BNP (last 3 results)  Recent Labs  07/25/15 1640  BNP 65.6    ProBNP (last 3 results) No results for input(s): PROBNP in the last 8760 hours.  CBG: No results for input(s): GLUCAP in the last 168 hours.  Recent Results (from the past 240 hour(s))  Urine culture     Status: None   Collection Time: 10/18/15  6:22 PM  Result Value Ref Range Status   Specimen Description URINE, CLEAN CATCH  Final   Special Requests NONE  Final   Culture 2,000 COLONIES/mL INSIGNIFICANT GROWTH  Final   Report Status 10/20/2015 FINAL  Final     Studies: No results  found.  Scheduled Meds: . ciprofloxacin  500 mg Oral BID  . docusate sodium  100 mg Oral BID  . donepezil  10 mg Oral QHS  . enoxaparin (LOVENOX) injection  30 mg Subcutaneous Q24H  . metroNIDAZOLE  500 mg Oral 3 times per day  . multivitamin with minerals  1 tablet Oral Daily  . nicotine  21 mg Transdermal Daily  . sertraline  25 mg Oral Daily   Continuous Infusions: . sodium chloride 75 mL/hr at 10/18/15 0430    Principal Problem:   Lower abdominal pain Active Problems:   Alzheimer's disease   Insomnia   GAIT DISTURBANCE   Colitis   H/O prostate cancer   Bladder wall thickening   Generalized abdominal pain   Abdominal pain    Time spent: 35 minutes.     [facility] Pager [phone] If 7PM-7AM, please contact night-coverage at [email or web address], password [facility] 10/21/2015, 6:53 PM  LOS: 3 days

## 2015-10-22 NOTE — Progress Notes (Signed)
[facility] PROGRESS NOTE  [name] Rice MBE:[id number] DOB: 1925/04/19 DOA: 10/17/2015 PCP: No primary care provider on file.  Assessment/Plan: Mr. [name] is a 81 year old male with a past medical history significant for dementia, insomnia, prostate cancer, and previous right hernia repair; Who presents with a five-day history of lower abdominal pain. Patient history is given by his power of attorney is present in the room as the patient has history of dementia and is only able to provide parts of the history. Symptoms have occurred intermittently and progressively worsened over the last 5 days.    Abdominal Pain: Colitis Vs UTI/  CT with possible colitis.  ESR, CRP not significant elevated. D dimer negative.  [place] negative for inguinal hernia.  Continue  ciprofloxacin  and flagyl.  Urine cultures negative.  Advance diet as tolerated.  He will need pain medications at discharge.   H/O prostate cancer: Patient was reported to be treated with some medication    Bladder wall thickening: As seen on CT. May need further work-up out patient, POA aware. . Urine cultures are negative.   left epididymis shows a 1.5 cm cyst or spermatocele; needs to follow up with urology, POA aware.   Alzheimer's disease - continue Aricept   Insomnia - therapeutic substitution for home medication with Ambien   History of gait disturbance - PT recommending SNF.   Hx of Diabetes mellitus type 2 diet-controlled - continue to monitor CBG (last 3)  No results for input(s): GLUCAP in the last 72 hours.     Code Status: DNR Family Communication: care discussed with [name] who is POA Disposition Plan: SNF on Monday.   Consultants:  none  Procedures:  [place]  Antibiotics:  cipro 12-21  Flagyl 12-21  HPI/Subjective: NO NEW complaints , wants to know when he can go home.   Objective: Filed Vitals:   10/22/15 0652 10/22/15 1026  BP: 148/81 96/65  Pulse: 83 99  Temp:  98.3  F (36.8 C)  Resp:  16    Intake/Output Summary (Last 24 hours) at 10/22/15 1546 Last data filed at 10/22/15 1426  Gross per 24 hour  Intake    480 ml  Output    270 ml  Net    210 ml   Filed Weights   10/18/15 2133 10/19/15 2119 10/21/15 2000  Weight: 47.174 kg (104 lb) 47.991 kg (105 lb 12.8 oz) 44.815 kg (98 lb 12.8 oz)    Exam:   General:  Alert in no distress  Cardiovascular: S 1, S 2 RRR  Respiratory: CTA  Abdomen: BS present, soft, mild supra-pubic tenderness  Musculoskeletal: no edema  Data Reviewed: Basic Metabolic Panel:  Recent Labs Lab 10/17/15 1956 10/19/15 0730 10/20/15 0615  NA 141 140 137  K 3.5 2.9* 3.9  CL 106 106 101  CO2 '22 26 28  ' GLUCOSE 76 105* 149*  BUN 13 <5* <5*  CREATININE 1.00 0.70 0.70  CALCIUM 9.2 7.9* 8.8*   Liver Function Tests:  Recent Labs Lab 10/17/15 1956  AST 25  ALT 14*  ALKPHOS 50  BILITOT 0.7  PROT 6.6  ALBUMIN 3.9    Recent Labs Lab 10/17/15 1956 10/18/15 0158  LIPASE 24  --   AMYLASE  --  43   No results for input(s): AMMONIA in the last 168 hours. CBC:  Recent Labs Lab 10/17/15 1956 10/19/15 0930 10/20/15 0615  WBC 4.7 3.6* 4.9  HGB 15.0 13.1 14.2  HCT 44.4 38.6* 41.7  MCV 98.4 96.7 96.8  PLT 248 195 191   Cardiac Enzymes: No results for input(s): CKTOTAL, CKMB, CKMBINDEX, TROPONINI in the last 168 hours. BNP (last 3 results)  Recent Labs  07/25/15 1640  BNP 65.6    ProBNP (last 3 results) No results for input(s): PROBNP in the last 8760 hours.  CBG: No results for input(s): GLUCAP in the last 168 hours.  Recent Results (from the past 240 hour(s))  Urine culture     Status: None   Collection Time: 10/18/15  6:22 PM  Result Value Ref Range Status   Specimen Description URINE, CLEAN CATCH  Final   Special Requests NONE  Final   Culture 2,000 COLONIES/mL INSIGNIFICANT GROWTH  Final   Report Status 10/20/2015 FINAL  Final     Studies: No results found.  Scheduled  Meds: . ciprofloxacin  500 mg Oral BID  . docusate sodium  100 mg Oral BID  . donepezil  10 mg Oral QHS  . enoxaparin (LOVENOX) injection  30 mg Subcutaneous Q24H  . metroNIDAZOLE  500 mg Oral 3 times per day  . multivitamin with minerals  1 tablet Oral Daily  . nicotine  21 mg Transdermal Daily  . sertraline  25 mg Oral Daily   Continuous Infusions: . sodium chloride 75 mL/hr at 10/18/15 0430    Principal Problem:   Lower abdominal pain Active Problems:   Alzheimer's disease   Insomnia   GAIT DISTURBANCE   Colitis   H/O prostate cancer   Bladder wall thickening   Generalized abdominal pain   Abdominal pain    Time spent: 15 minutes.     [facility] Pager [phone] If 7PM-7AM, please contact night-coverage at [email or web address], password [facility] 10/22/2015, 3:46 PM  LOS: 4 days

## 2015-10-23 DIAGNOSIS — F0280 Dementia in other diseases classified elsewhere without behavioral disturbance: Secondary | ICD-10-CM

## 2015-10-23 DIAGNOSIS — G309 Alzheimer's disease, unspecified: Secondary | ICD-10-CM

## 2015-10-23 MED ORDER — CIPROFLOXACIN HCL 500 MG PO TABS: 500.0000 mg | ORAL_TABLET | Freq: Two times a day (BID) | ORAL | Status: DC

## 2015-10-23 MED ORDER — METRONIDAZOLE 500 MG PO TABS: 500.0000 mg | ORAL_TABLET | Freq: Three times a day (TID) | ORAL | Status: DC

## 2015-10-23 NOTE — Progress Notes (Signed)
PT Cancellation Note  Patient Details Name: [name] Rice MRN: [id number] DOB: 1925-02-10   Cancelled Treatment:    Reason Eval/Treat Not Completed: Other (comment).  Per RN CM note and pt himself, he is due to d/c today.  Pt would like to conserve his energy to get home.  PT will check back tomorrow to ensure pt discharged.    Thanks,    Barbarann Ehlers. Krissa Utke, PT, DPT [phone]   10/23/2015, 4:55 PM

## 2015-10-23 NOTE — Discharge Summary (Signed)
Physician Discharge Summary  [name] Rice MRN:[id number] DOB: 05/09/1925 DOA: 10/17/2015  PCP: No primary care provider on file.  Admit date: 10/17/2015 Discharge date: 10/23/2015  Time spent: 25  minutes  Recommendations for Outpatient Follow-up:  1. Follow up with PCP in one week 2. Follow up with urology as recommended.    Discharge Diagnoses:  Principal Problem:   Lower abdominal pain Active Problems:   Alzheimer's disease   Insomnia   GAIT DISTURBANCE   Colitis   H/O prostate cancer   Bladder wall thickening   Generalized abdominal pain   Abdominal pain   Discharge Condition: improved  Diet recommendation: regular  Filed Weights   10/18/15 2133 10/19/15 2119 10/21/15 2000  Weight: 47.174 kg (104 lb) 47.991 kg (105 lb 12.8 oz) 44.815 kg (98 lb 12.8 oz)    History of present illness:   [name] is a 81-year-old male with a past medical history significant for dementia, insomnia, prostate cancer, and previous right hernia repair; Who presents with a five-day history of lower abdominal pain. Symptoms have occurred intermittently and progressively worsened over the last 5 days.   Hospital Course:  Abdominal Pain: Colitis Vs UTI/  CT with possible colitis.  ESR, CRP not significant elevated. D dimer negative.  US negative for inguinal hernia.  Continue ciprofloxacin and flagyl to complete the course.   Urine cultures negative.  Advanced  diet as tolerated.    H/O prostate cancer: Patient was reported to be treated with some medication    Bladder wall thickening: As seen on CT. May need further work-up out patient, POA aware. . Urine cultures are negative.  left epididymis shows a 1.5 cm cyst or spermatocele; needs to follow up with urology, POA aware.   Alzheimer's disease - continue Aricept   Insomnia - therapeutic substitution for home medication with Ambien   History of gait disturbance -  Home health PT   Hx of Diabetes mellitus  type 2 diet-controlled - continue to monitor CBG (last 3)   Recent Labs (last 2 labs)     No results for input(s): GLUCAP in the last 72 hours.         Procedures:  none  Consultations: none Discharge Exam: Filed Vitals:   10/23/15 0410 10/23/15 1740  BP: 148/80 120/78  Pulse: 83 95  Temp: 98.4 F (36.9 C) 99.1 F (37.3 C)  Resp: 18 17    General: alert comfortable. Cardiovascular: s1s2 Respiratory: ctab  Discharge Instructions   Discharge Instructions    Diet - low sodium heart healthy    Complete by:  As directed      Discharge instructions    Complete by:  As directed   Please follow up with PCP in one week.     Increase activity slowly    Complete by:  As directed           Current Discharge Medication List    START taking these medications   Details  ciprofloxacin (CIPRO) 500 MG tablet Take 1 tablet (500 mg total) by mouth 2 (two) times daily. Qty: 18 tablet, Refills: 0    metroNIDAZOLE (FLAGYL) 500 MG tablet Take 1 tablet (500 mg total) by mouth every 8 (eight) hours. Qty: 27 tablet, Refills: 0      CONTINUE these medications which have NOT CHANGED   Details  Capsicum Oleoresin (ARTHRITIS PAIN RELIEF RUB EX) Apply 1 application topically daily as needed (arthritis pain.).    carbamide peroxide (DEBROX) 6.5 % otic solution Place   5 drops into both ears daily as needed (earwax removal.).    docusate sodium (COLACE) 100 MG capsule Take 1 capsule (100 mg total) by mouth 2 (two) times daily as needed for mild constipation. Qty: 10 capsule, Refills: 0    donepezil (ARICEPT) 10 MG tablet Take 1 tablet (10 mg total) by mouth at bedtime. Qty: 30 tablet, Refills: 0    Eszopiclone (ESZOPICLONE) 3 MG TABS Take 3 mg by mouth at bedtime. Take immediately before bedtime    Multiple Vitamin (MULTIVITAMIN WITH MINERALS) TABS tablet Take 1 tablet by mouth daily.    OVER THE COUNTER MEDICATION Place 1-2 drops into both eyes daily as needed (dry eyes.). Over  the counter eye drops for dry eyes.    sertraline (ZOLOFT) 25 MG tablet Take 1 tablet (25 mg total) by mouth daily. Qty: 30 tablet, Refills: 0    metoprolol succinate (TOPROL XL) 25 MG 24 hr tablet Take 1 tablet (25 mg total) by mouth daily. Qty: 30 tablet, Refills: 0      STOP taking these medications     ibuprofen (ADVIL,MOTRIN) 200 MG tablet      isosorbide mononitrate (IMDUR) 30 MG 24 hr tablet        No Known Allergies    The results of significant diagnostics from this hospitalization (including imaging, microbiology, ancillary and laboratory) are listed below for reference.    Significant Diagnostic Studies: Us Pelvis Limited  10/18/2015  CLINICAL DATA:  Lower abdominal pain.  Question inguinal hernia. EXAM: LIMITED ULTRASOUND OF PELVIS TECHNIQUE: Limited transabdominal ultrasound examination of the pelvis was performed. COMPARISON:  CT abdomen pelvis 10/18/2015. FINDINGS: No inguinal hernia. No solid or cystic mass. Incidental imaging of the left epididymis shows a 1.5 cm cyst or spermatocele. IMPRESSION: No evidence of an inguinal hernia. Electronically Signed   By: Melinda  Blietz M.D.   On: 10/18/2015 08:08   Ct Abdomen Pelvis W Contrast  10/18/2015  CLINICAL DATA:  81-year-old male with abdominal pain and episodes of nausea vomiting and constipation. EXAM: CT ABDOMEN AND PELVIS WITH CONTRAST TECHNIQUE: Multidetector CT imaging of the abdomen and pelvis was performed using the standard protocol following bolus administration of intravenous contrast. CONTRAST:  80mL OMNIPAQUE IOHEXOL 300 MG/ML  SOLN COMPARISON:  CT dated 06/01/2015 FINDINGS: Bibasilar subsegmental linear atelectasis/ scarring. The visualized lung bases are otherwise clear. There is coronary vascular calcification. No intra-abdominal free air or free fluid. Stable appearing left hepatic cystic lesion. Stable appearing lobulated vascular lesion in the periphery of the right lobe of the liver most compatible with  a portal venous shunting. The gallbladder, pancreas, spleen, and the adrenal glands appear unremarkable. Stable appearing left renal hypodense lesions, likely cysts. Small left renal parapelvic cysts noted. There is a 3 mm nonobstructing right renal interpolar calculus. No hydronephrosis on either side. The visualized ureters appear unremarkable. There is diffuse thickening and trabecular appearance of the bladder wall compatible with chronic bladder outlet obstruction. The prostate gland is enlarged with median lobe hypertrophy. There is a large hiatal hernia with gastroesophageal reflux. Oral contrast opacifies the stomach and multiple loops of small bowel. There is no evidence of bowel obstruction or inflammation. There is apparent diffuse thickening of the wall of the colon predominantly involving the rectosigmoid which may be related to underdistention. Colitis is not excluded. Clinical correlation is recommended. There is sigmoid diverticulosis without active inflammation. Normal appendix. Advanced aortoiliac atherosclerotic disease. The origins of the celiac axis, SMA, IMA as well as the origins of the   renal arteries appear patent. No portal venous gas identified. There is no adenopathy. The abdominal wall soft tissues appear unremarkable. There is osteopenia with extensive degenerative changes of the spine. Old healed left posterior rib fractures. No acute fracture identified. There are bilateral L5 pars defects with grade 1 L5-S1 anterolisthesis. There is T12 compression fractures similar to prior study. IMPRESSION: Under distention versus colitis. Correlation with clinical exam and stool cultures recommended. No bowel obstruction. Normal appendix. Moderate-sized hiatal hernia. Diffuse bladder wall thickening sequela of chronic bladder outlet obstruction. Correlation with urinalysis recommended to exclude superimposed UTI. Other findings similar to the prior CT. Electronically Signed   By: Arash  Radparvar  M.D.   On: 10/18/2015 01:02    Microbiology: Recent Results (from the past 240 hour(s))  Urine culture     Status: None   Collection Time: 10/18/15  6:22 PM  Result Value Ref Range Status   Specimen Description URINE, CLEAN CATCH  Final   Special Requests NONE  Final   Culture 2,000 COLONIES/mL INSIGNIFICANT GROWTH  Final   Report Status 10/20/2015 FINAL  Final     Labs: Basic Metabolic Panel:  Recent Labs Lab 10/17/15 1956 10/19/15 0730 10/20/15 0615  NA 141 140 137  K 3.5 2.9* 3.9  CL 106 106 101  CO2 22 26 28  GLUCOSE 76 105* 149*  BUN 13 <5* <5*  CREATININE 1.00 0.70 0.70  CALCIUM 9.2 7.9* 8.8*   Liver Function Tests:  Recent Labs Lab 10/17/15 1956  AST 25  ALT 14*  ALKPHOS 50  BILITOT 0.7  PROT 6.6  ALBUMIN 3.9    Recent Labs Lab 10/17/15 1956 10/18/15 0158  LIPASE 24  --   AMYLASE  --  43   No results for input(s): AMMONIA in the last 168 hours. CBC:  Recent Labs Lab 10/17/15 1956 10/19/15 0930 10/20/15 0615  WBC 4.7 3.6* 4.9  HGB 15.0 13.1 14.2  HCT 44.4 38.6* 41.7  MCV 98.4 96.7 96.8  PLT 248 195 191   Cardiac Enzymes: No results for input(s): CKTOTAL, CKMB, CKMBINDEX, TROPONINI in the last 168 hours. BNP: BNP (last 3 results)  Recent Labs  07/25/15 1640  BNP 65.6    ProBNP (last 3 results) No results for input(s): PROBNP in the last 8760 hours.  CBG: No results for input(s): GLUCAP in the last 168 hours.     Signed:  , MD  FACP  [facility] 10/23/2015, 6:56 PM     

## 2015-10-23 NOTE — Progress Notes (Signed)
Pt. discharged to home with caregiver, [name]. Pt after visit summary reviewed and pt caregiver capable of re verbalizing medications and follow up appointments. Pt remains stable. No signs and symptoms of distress. Educated to return to ER in the event of SOB, dizziness, chest pain, or fainting. Also educated caregiver that pt needs to find a PCP for follow-up care. Pt also educated not to drink while taking flagyl. Mady Gemma, RN

## 2015-10-23 NOTE — Care Management Note (Addendum)
Case Management Note  Patient Details  Name: [name] Rice MRN: [id number] Date of Birth: [birth year]  Subjective/Objective:       CM following for progression and d/c planning.             Action/Plan: 10/23/2015 Plan for d/c to SNF however pt does not need the rehab in a skilled facility, therefore his insurance will not cover. Pt will d/c to home with caregiver. DME ordered as requested by caregiver. HHRN, HHPT and [place] ordered from [facility] per caregivers request. Caregiver will pick up pt later today.   Expected Discharge Date:     10/23/2015             Expected Discharge Plan:  [place]  In-House Referral:  NA  Discharge planning Services  CM Consult  Post Acute Care Choice:  Durable Medical Equipment, Home Health Choice offered to:   Caregiver  DME Arranged:  3-N-1, [place] rolling DME Agency:  [place]:  RN, PT, OT, [facility] aide, Social worker.  [place] Agency:  [place]  Status of Service:  Completed, signed off  Medicare Important Message Given:    Date Medicare IM Given:    Medicare IM give by:    Date Additional Medicare IM Given:    Additional Medicare Important Message give by:     If discussed at [place] of Stay Meetings, dates discussed:    Additional Comments:  Adron Bene, RN 10/23/2015, 1:56 PM

## 2015-10-29 ENCOUNTER — Encounter (HOSPITAL_COMMUNITY): Payer: Self-pay | Admitting: Emergency Medicine

## 2015-10-29 ENCOUNTER — Emergency Department (HOSPITAL_COMMUNITY): Payer: Medicare HMO

## 2015-10-29 ENCOUNTER — Observation Stay (HOSPITAL_COMMUNITY)
Admission: EM | Admit: 2015-10-29 | Discharge: 2015-11-01 | Disposition: A | Discharge status: Home / Self Care | Payer: Medicare HMO | Attending: Internal Medicine | Admitting: Internal Medicine

## 2015-10-29 DIAGNOSIS — E119 Type 2 diabetes mellitus without complications: Secondary | ICD-10-CM | POA: Insufficient documentation

## 2015-10-29 DIAGNOSIS — F0391 Unspecified dementia with behavioral disturbance: Secondary | ICD-10-CM

## 2015-10-29 DIAGNOSIS — M1990 Unspecified osteoarthritis, unspecified site: Secondary | ICD-10-CM | POA: Diagnosis not present

## 2015-10-29 DIAGNOSIS — F17200 Nicotine dependence, unspecified, uncomplicated: Secondary | ICD-10-CM | POA: Diagnosis not present

## 2015-10-29 DIAGNOSIS — R4182 Altered mental status, unspecified: Secondary | ICD-10-CM | POA: Diagnosis present

## 2015-10-29 DIAGNOSIS — N3289 Other specified disorders of bladder: Secondary | ICD-10-CM | POA: Diagnosis present

## 2015-10-29 DIAGNOSIS — I4510 Unspecified right bundle-branch block: Secondary | ICD-10-CM | POA: Insufficient documentation

## 2015-10-29 DIAGNOSIS — F1010 Alcohol abuse, uncomplicated: Secondary | ICD-10-CM | POA: Diagnosis present

## 2015-10-29 DIAGNOSIS — Z8546 Personal history of malignant neoplasm of prostate: Secondary | ICD-10-CM | POA: Insufficient documentation

## 2015-10-29 DIAGNOSIS — F0281 Dementia in other diseases classified elsewhere with behavioral disturbance: Secondary | ICD-10-CM | POA: Insufficient documentation

## 2015-10-29 DIAGNOSIS — K529 Noninfective gastroenteritis and colitis, unspecified: Secondary | ICD-10-CM | POA: Diagnosis not present

## 2015-10-29 DIAGNOSIS — K449 Diaphragmatic hernia without obstruction or gangrene: Secondary | ICD-10-CM | POA: Insufficient documentation

## 2015-10-29 DIAGNOSIS — R197 Diarrhea, unspecified: Secondary | ICD-10-CM | POA: Diagnosis not present

## 2015-10-29 DIAGNOSIS — G9340 Encephalopathy, unspecified: Secondary | ICD-10-CM | POA: Diagnosis not present

## 2015-10-29 DIAGNOSIS — G4700 Insomnia, unspecified: Secondary | ICD-10-CM | POA: Diagnosis not present

## 2015-10-29 DIAGNOSIS — E860 Dehydration: Secondary | ICD-10-CM | POA: Insufficient documentation

## 2015-10-29 DIAGNOSIS — Z8673 Personal history of transient ischemic attack (TIA), and cerebral infarction without residual deficits: Secondary | ICD-10-CM | POA: Diagnosis not present

## 2015-10-29 DIAGNOSIS — N329 Bladder disorder, unspecified: Secondary | ICD-10-CM | POA: Diagnosis not present

## 2015-10-29 DIAGNOSIS — E876 Hypokalemia: Secondary | ICD-10-CM | POA: Insufficient documentation

## 2015-10-29 DIAGNOSIS — F0280 Dementia in other diseases classified elsewhere without behavioral disturbance: Secondary | ICD-10-CM | POA: Diagnosis present

## 2015-10-29 DIAGNOSIS — Z79899 Other long term (current) drug therapy: Secondary | ICD-10-CM | POA: Diagnosis not present

## 2015-10-29 DIAGNOSIS — E870 Hyperosmolality and hypernatremia: Secondary | ICD-10-CM | POA: Diagnosis not present

## 2015-10-29 DIAGNOSIS — G309 Alzheimer's disease, unspecified: Secondary | ICD-10-CM | POA: Diagnosis not present

## 2015-10-29 LAB — I-STAT TROPONIN, ED: TROPONIN I, POC: 0 ng/mL (ref 0.00–0.08)

## 2015-10-29 LAB — URINE MICROSCOPIC-ADD ON
BACTERIA UA: NONE SEEN
RBC / HPF: NONE SEEN RBC/hpf (ref 0–5)
SQUAMOUS EPITHELIAL / LPF: NONE SEEN

## 2015-10-29 LAB — URINALYSIS, ROUTINE W REFLEX MICROSCOPIC
BILIRUBIN URINE: NEGATIVE
GLUCOSE, UA: NEGATIVE mg/dL
HGB URINE DIPSTICK: NEGATIVE
KETONES UR: NEGATIVE mg/dL
NITRITE: NEGATIVE
PH: 6 (ref 5.0–8.0)
Protein, ur: NEGATIVE mg/dL
SPECIFIC GRAVITY, URINE: 1.039 — AB (ref 1.005–1.030)

## 2015-10-29 LAB — COMPREHENSIVE METABOLIC PANEL
ALK PHOS: 40 U/L (ref 38–126)
ALT: 23 U/L (ref 17–63)
ANION GAP: 7 (ref 5–15)
AST: 24 U/L (ref 15–41)
Albumin: 4 g/dL (ref 3.5–5.0)
BILIRUBIN TOTAL: 0.5 mg/dL (ref 0.3–1.2)
BUN: 25 mg/dL — AB (ref 6–20)
CALCIUM: 9 mg/dL (ref 8.9–10.3)
CO2: 29 mmol/L (ref 22–32)
Chloride: 110 mmol/L (ref 101–111)
Creatinine, Ser: 0.82 mg/dL (ref 0.61–1.24)
GFR calc Af Amer: >60 mL/min (ref >60)
Glucose, Bld: 109 mg/dL — ABNORMAL HIGH (ref 65–99)
POTASSIUM: 3.1 mmol/L — AB (ref 3.5–5.1)
Sodium: 146 mmol/L — ABNORMAL HIGH (ref 135–145)
TOTAL PROTEIN: 6.6 g/dL (ref 6.5–8.1)

## 2015-10-29 LAB — CBC WITH DIFFERENTIAL/PLATELET
Basophils Absolute: 0.1 10*3/uL (ref 0.0–0.1)
Basophils Relative: 1 %
Eosinophils Absolute: 0.3 10*3/uL (ref 0.0–0.7)
Eosinophils Relative: 5 %
HEMATOCRIT: 38.5 % — AB (ref 39.0–52.0)
HEMOGLOBIN: 13.1 g/dL (ref 13.0–17.0)
LYMPHS PCT: 27 %
Lymphs Abs: 1.5 10*3/uL (ref 0.7–4.0)
MCH: 33.4 pg (ref 26.0–34.0)
MCHC: 34 g/dL (ref 30.0–36.0)
MCV: 98.2 fL (ref 78.0–100.0)
MONO ABS: 0.4 10*3/uL (ref 0.1–1.0)
MONOS PCT: 8 %
NEUTROS ABS: 3.3 10*3/uL (ref 1.7–7.7)
Neutrophils Relative %: 59 %
Platelets: 245 10*3/uL (ref 150–400)
RBC: 3.92 MIL/uL — ABNORMAL LOW (ref 4.22–5.81)
RDW: 14.1 % (ref 11.5–15.5)
WBC: 5.5 10*3/uL (ref 4.0–10.5)

## 2015-10-29 LAB — ETHANOL: Alcohol, Ethyl (B): <5 mg/dL (ref <5)

## 2015-10-29 LAB — PHOSPHORUS: Phosphorus: 2.7 mg/dL (ref 2.5–4.6)

## 2015-10-29 LAB — I-STAT CG4 LACTIC ACID, ED: Lactic Acid, Venous: 1.01 mmol/L (ref 0.5–2.0)

## 2015-10-29 LAB — MAGNESIUM: MAGNESIUM: 1.9 mg/dL (ref 1.7–2.4)

## 2015-10-29 MED ORDER — ZOLPIDEM TARTRATE 5 MG PO TABS: 5.0000 mg | ORAL_TABLET | Freq: Every evening | ORAL | Status: DC | PRN

## 2015-10-29 MED ORDER — DONEPEZIL HCL 10 MG PO TABS: 10.0000 mg | ORAL_TABLET | Freq: Every day | ORAL | Status: DC

## 2015-10-29 NOTE — ED Provider Notes (Signed)
The patient is 82 years old, he presents by ambulance after he was reportedly having increased agitation at home with a caregiver. The patient has no complaints and is very calm here. He does have a skin tear to the dorsum of the right wrist, he does not have any other signs of injury, no complaints, supple joints, soft compartments, clear heart and lung sounds. He does have a misshapen left pupil though this does appear chronic and likely related to prior surgery or injury. There is no other signs of head trauma, no rashes or cellulitis, no fever or shortness of breath or hypoxia. The patient will receive a workup for the source of his potential agitation though at this time he does appear very comfortable. Of note the patient does have dementia and is not currently medicated for this, it is very difficult to get any information out of the caregiver, we will continue to attempt to do this   EKG Interpretation  Date/Time:  Sunday October 29 2015 15:58:32 EST Ventricular Rate:  63 PR Interval:  161 QRS Duration: 129 QT Interval:  478 QTC Calculation: 489 R Axis:   77 Text Interpretation:  Sinus rhythm Consider left atrial enlargement Right bundle branch block Since last tracing QRS has lengthened. Confirmed by Sabra Heck  MD, [place] ([id number]) on 10/29/2015 4:33:30 PM      Medical screening examination/treatment/procedure(s) were conducted as a shared visit with non-physician practitioner(s) and myself.  I personally evaluated the patient during the encounter.  Clinical Impression:   Final diagnoses:  Dementia, with behavioral disturbance         Noemi Chapel, MD 11/07/15 1157

## 2015-10-29 NOTE — ED Notes (Signed)
Called courtney edwards, caregiver, who stated that we could not send the patient home. PA and MD notified.

## 2015-10-29 NOTE — Progress Notes (Addendum)
CSW spoke with pt's caregiver, [name]. [name] sounded drowsy. Caregiver reports pt was set up with [facility] after his most recent hospitalization at [facility]. Per [name], a triage nurse called to get information about the pt today, and after interview told [name] he was not safe and needed to be brought to the hospital for psychiatric observation until his new PCP, Dr. Jennette Bill, can see him the morning. Pt has never met with this PCP before. [name] reports that pt is not sleeping at all and he is digging through the garbage.  Discussed with [name] that if nothing is medically wrong with pt, he will be discharged, as his behavior is appropriate with both [place] and EMS report. CSW also broached topic of a permanent placement for pt, though this is not a possibility today from ED. [name] states she does not want pt in a placement; she just wants pt to be treated apporpriately, specifically with medicine to help him sleep. CSW provided phone number to [facility], as well as support group [email or web address] to sign off at this time.  CSW staffed case with PA; awaiting medical workup but team in agreement that pt is not warranting a psych consult at this time. CSW also left message for weekday case manager for potential follow up.   [facility] Worker [place] [facility] phone: [phone]

## 2015-10-29 NOTE — ED Notes (Signed)
Per EMS, Pt from home with caregiver. Pt has hx of Dementia and was combative this morning. Pt not combative for EMS. Pt pleasantly confused at this time which is baseline.

## 2015-10-29 NOTE — ED Notes (Signed)
Called caregiver x 1 to ask about transport home. No answer. Left message. Will try again.

## 2015-10-29 NOTE — H&P (Signed)
[facility] History and Physical  [name] Rice [id number] DOB: [birth year] DOA: 10/29/2015  Referring physician: [place] Lions, PA-C PCP: No primary care provider on file.   Chief Complaint: Worsening of dementia.  HPI: [name] Rice is a 82 y.o. male with a past medical history of osteoarthritis, prostate cancer, CVA, past alcohol abuse, type 2 diabetes who was brought to the emergency department due to worsening of his dementia. Apparently, the patient has been agitated, restless and having worsening of insomnia at night. His caregiver, per records, discussed this with his home health nurse who recommended to bring him to the hospital.  At this time, I am unable to communicate with his caregiver to obtain further information. The patient is confused and unable to provide further history. We are being asked to admit him to the hospital for further evaluation and social services consult for possible placement or adjustment of his home health services.   Review of Systems:  Unable to review.   Past Medical History  Diagnosis Date  . Arthritis   . Cancer [facility])     prostate   . Stroke ([place])   . 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 Nasal congestion   . Hearing loss   . Difficulty urinating   . Weakness   . Dementia   . Diabetes mellitus without complication [facility])    Past Surgical History  Procedure Laterality Date  . Hernia repair  2012   Social History:  reports that he has been smoking.  He does not have any smokeless tobacco history on file. He reports that he drinks alcohol. He reports that he does not use illicit drugs.  No Known Allergies  Family History  Problem Relation Age of Onset  . Heart attack Father   . Diabetes Brother   . Hypertension Brother     Prior to Admission medications   Medication Sig Start Date End Date Taking? Authorizing Provider  Capsicum Oleoresin (ARTHRITIS PAIN RELIEF RUB EX) Apply 1 application topically daily  as needed (arthritis pain.).   Yes Historical Provider, MD  carbamide peroxide (DEBROX) 6.5 % otic solution Place 5 drops into both ears daily as needed (earwax removal.).   Yes Historical Provider, MD  diphenhydramine-acetaminophen (TYLENOL PM) 25-500 MG TABS tablet Take 2 tablets by mouth at bedtime as needed (sleep).   Yes Historical Provider, MD  docusate sodium (COLACE) 100 MG capsule Take 1 capsule (100 mg total) by mouth 2 (two) times daily as needed for mild constipation. 06/01/15  Yes Debby Freiberg, MD  Eszopiclone (ESZOPICLONE) 3 MG TABS Take 3 mg by mouth at bedtime. Take immediately before bedtime   Yes Historical Provider, MD  isosorbide mononitrate (IMDUR) 30 MG 24 hr tablet Take 30 mg by mouth daily. 10/27/15  Yes Historical Provider, MD  metoprolol succinate (TOPROL XL) 25 MG 24 hr tablet Take 1 tablet (25 mg total) by mouth daily. 07/26/15  Yes Robbie Lis, MD  metroNIDAZOLE (FLAGYL) 500 MG tablet Take 1 tablet (500 mg total) by mouth every 8 (eight) hours. 10/23/15  Yes Hosie Poisson, MD  Multiple Vitamin (MULTIVITAMIN WITH MINERALS) TABS tablet Take 1 tablet by mouth daily.   Yes Historical Provider, MD  OVER THE COUNTER MEDICATION Place 1-2 drops into both eyes daily as needed (dry eyes.). Over the counter eye drops for dry eyes.   Yes Historical Provider, MD  OVER THE COUNTER MEDICATION Take 1 tablet by mouth daily as needed (allergies).  OTC allergy tablets   Yes Historical Provider, MD  OVER THE COUNTER MEDICATION Take 1 tablet by mouth daily as needed (constipation). [facility] laxative   Yes Historical Provider, MD  Phenylephrine-APAP-Guaifenesin ([place] FAST-MAX COLD & SINUS) 10-650-400 MG/20ML LIQD Take 1 Dose by mouth 2 (two) times daily as needed (cold symptoms).   Yes Historical Provider, MD  sertraline (ZOLOFT) 25 MG tablet Take 1 tablet (25 mg total) by mouth daily. 06/01/15  Yes Debby Freiberg, MD  ciprofloxacin (CIPRO) 500 MG tablet Take 1 tablet (500 mg total) by mouth 2  (two) times daily. 10/23/15   Hosie Poisson, MD  donepezil (ARICEPT) 10 MG tablet Take 1 tablet (10 mg total) by mouth at bedtime. 10/29/15   [place] Lions, PA-C  zolpidem (AMBIEN) 5 MG tablet Take 1 tablet (5 mg total) by mouth at bedtime as needed for sleep. 10/29/15   [place] Lions, PA-C   Physical Exam: Filed Vitals:   10/29/15 1351 10/29/15 1600 10/29/15 1825 10/29/15 2117  BP: 146/80 101/89 140/98 115/97  Pulse: 68 64 68 83  Temp: 98 F (36.7 C)   98.3 F (36.8 C)  TempSrc: Oral   Oral  Resp: 16 14 16 18   SpO2: 94% 93% 95% 100%    Wt Readings from Last 3 Encounters:  10/23/15 45 kg (99 lb 3.3 oz)  07/25/15 47.764 kg (105 lb 4.8 oz)  07/15/13 51.1 kg (112 lb 10.5 oz)    General:  Appears calm and comfortable Eyes: Right eye PERRL, left iris coloboma. ENT: grossly normal hearing, lips & tongue Neck: no LAD, masses or thyromegaly Cardiovascular: RRR, no m/r/g. No LE edema. Telemetry: SR, no arrhythmias  Respiratory: CTA bilaterally, no w/r/r. Normal respiratory effort. Abdomen: soft, ntnd Skin: no rash or induration seen on limited exam Musculoskeletal: grossly normal tone BUE/BLE Psychiatric: Confused, oriented to name only, but calm at the moment. Neurologic: Awake, alert, oriented 1, grossly non-focal.          Labs on Admission:  Basic Metabolic Panel:  Recent Labs Lab 10/29/15 1558  NA 146*  K 3.1*  CL 110  CO2 29  GLUCOSE 109*  BUN 25*  CREATININE 0.82  CALCIUM 9.0   Liver Function Tests:  Recent Labs Lab 10/29/15 1558  AST 24  ALT 23  ALKPHOS 40  BILITOT 0.5  PROT 6.6  ALBUMIN 4.0   CBC:  Recent Labs Lab 10/29/15 1558  WBC 5.5  NEUTROABS 3.3  HGB 13.1  HCT 38.5*  MCV 98.2  PLT 245    BNP (last 3 results)  Recent Labs  07/25/15 1640  BNP 65.6     Radiological Exams on Admission: Dg Chest 2 View  10/29/2015  CLINICAL DATA:  Altered mental status. EXAM: CHEST  2 VIEW COMPARISON:  07/25/2015, 08/04/2013 and  07/15/2013 FINDINGS: Heart size and pulmonary vascularity are normal. Calcification in the thoracic aorta. Bilateral apical pleural thickening, right more than left. Large hiatal hernia.  Old thoracic compression fractures. IMPRESSION: No acute abnormality. Large hiatal hernia. Old compression fractures. Electronically Signed   By: Lorriane Shire M.D.   On: 10/29/2015 16:57    EKG: Independently reviewed.   Assessment/Plan Principal Problem:   Altered mental status   Worsening of Alzheimer's disease Admit to MedSurg. Continue Aricept by mouth 10 mg daily. Lorazepam as needed for agitation. Consider Psych consult in the morning. Social services evaluation.  Active Problems:   Alcohol abuse I was unsure if this was active or not, as I was  unable to speak to the family member. An alcohol, magnesium and phosphorus levels were normal earlier.    Insomnia Lorazepam as needed while in the hospital.    Colitis Continue oral ciprofloxacin and metronidazole.    Bladder wall thickening Given the patient's age and lack of capacity to make decisions, this is to be discussed with POA to see if urology evaluation is an option or not.   Social services was consulted by the emergency department.   Code Status: Full code. DVT Prophylaxis: Lovenox SQ. Family Communication: I try to contact Ms. [name], who appears that the patient caregiver on the medical record and did not obtain response. I was unable to leave a message, since her mailbox was full and unable to receive any further messages.  Edwards,[name] Other   [phone]  Disposition Plan: Admit to MedSurg, resume medications, social services evaluation in the morning.  Time spent: Over 70 minutes were used in the process of this admission.  Reubin Milan [facility] Pager [phone].

## 2015-10-29 NOTE — ED Notes (Signed)
EKG given to lead tech to give to MD.
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
## 2015-10-29 NOTE — ED Notes (Signed)
Bed: [id number] Expected date: 10/29/15 Expected time: 1:40 PM Means of arrival: Ambulance Comments: Mental Eval, Alzheimers, family wants eval

## 2015-10-29 NOTE — Discharge Instructions (Signed)
[name] Rice,  Nice meeting you! Please follow-up with your primary care provider. Return to the emergency department if your behavior changes outside of normal for you. Feel better soon!  S. Wendie Simmer, PA-C   Dementia Dementia is a general term for problems with brain function. A person with dementia has memory loss and a hard time with at least one other brain function such as thinking, speaking, or problem solving. Dementia can affect social functioning, how you do your job, your mood, or your personality. The changes may be hidden for a long time. The earliest forms of this disease are usually not detected by family or friends. Dementia can be:  Irreversible.  Potentially reversible.  Partially reversible.  Progressive. This means it can get worse over time. CAUSES  Irreversible dementia causes may include:  Degeneration of brain cells (Alzheimer disease or Lewy body dementia).  Multiple small strokes (vascular dementia).  Infection (chronic meningitis or Creutzfeldt-Jakob disease).  Frontotemporal dementia. This affects younger people, age 7 to 9, compared to those who have Alzheimer disease.  Dementia associated with other disorders like Parkinson disease, Huntington disease, or HIV-associated dementia. Potentially or partially reversible dementia causes may include:  Medicines.  Metabolic causes such as excessive alcohol intake, vitamin B12 deficiency, or thyroid disease.  Masses or pressure in the brain such as a tumor, blood clot, or hydrocephalus. SIGNS AND SYMPTOMS  Symptoms are often hard to detect. Family members or coworkers may not notice them early in the disease process. Different people with dementia may have different symptoms. Symptoms can include:  A hard time with memory, especially recent memory. Long-term memory may not be impaired.  Asking the same question multiple times or forgetting something someone just said.  A hard time speaking your  thoughts or finding certain words.  A hard time solving problems or performing familiar tasks (such as how to use a telephone).  Sudden changes in mood.  Changes in personality, especially increasing moodiness or mistrust.  Depression.  A hard time understanding complex ideas that were never a problem in the past. DIAGNOSIS  There are no specific tests for dementia.   Your health care provider may recommend a thorough evaluation. This is because some forms of dementia can be reversible. The evaluation will likely include a physical exam and getting a detailed history from you and a family member. The history often gives the best clues and suggestions for a diagnosis.  Memory testing may be done. A detailed brain function evaluation called neuropsychologic testing may be helpful.  Lab tests and brain imaging (such as a CT scan or MRI scan) are sometimes important.  Sometimes observation and re-evaluation over time is very helpful. TREATMENT  Treatment depends on the cause.   If the problem is a vitamin deficiency, it may be helped or cured with supplements.  For dementias such as Alzheimer disease, medicines are available to stabilize or slow the course of the disease. There are no cures for this type of dementia.  Your health care provider can help direct you to groups, organizations, and other health care providers to help with decisions in the care of you or your loved one. HOME CARE INSTRUCTIONS The care of individuals with dementia is varied and dependent upon the progression of the dementia. The following suggestions are intended for the person living with, or caring for, the person with dementia.  Create a safe environment.  Remove the locks on bathroom doors to prevent the person from accidentally locking himself  or herself in.  Use childproof latches on kitchen cabinets and any place where cleaning supplies, chemicals, or alcohol are kept.  Use childproof covers in  unused electrical outlets.  Install childproof devices to keep doors and windows secured.  Remove stove knobs or install safety knobs and an automatic shut-off on the stove.  Lower the temperature on water heaters.  Label medicines and keep them locked up.  Secure knives, lighters, matches, power tools, and guns, and keep these items out of reach.  Keep the house free from clutter. Remove rugs or anything that might contribute to a fall.  Remove objects that might break and hurt the person.  Make sure lighting is good, both inside and outside.  Install grab rails as needed.  Use a monitoring device to alert you to falls or other needs for help.  Reduce confusion.  Keep familiar objects and people around.  Use night lights or dim lights at night.  Label items or areas.  Use reminders, notes, or directions for daily activities or tasks.  Keep a simple, consistent routine for waking, meals, bathing, dressing, and bedtime.  Create a calm, quiet environment.  Place large clocks and calendars prominently.  Display emergency numbers and home address near all telephones.  Use cues to establish different times of the day. An example is to open curtains to let the natural light in during the day.   Use effective communication.  Choose simple words and short sentences.  Use a gentle, calm tone of voice.  Be careful not to interrupt.  If the person is struggling to find a word or communicate a thought, try to provide the word or thought.  Ask one question at a time. Allow the person ample time to answer questions. Repeat the question again if the person does not respond.  Reduce nighttime restlessness.  Provide a comfortable bed.  Have a consistent nighttime routine.  Ensure a regular walking or physical activity schedule. Involve the person in daily activities as much as possible.  Limit napping during the day.  Limit caffeine.  Attend social events that  stimulate rather than overwhelm the senses.  Encourage good nutrition and hydration.  Reduce distractions during meal times and snacks.  Avoid foods that are too hot or too cold.  Monitor chewing and swallowing ability.  Continue with routine vision, hearing, dental, and medical screenings.  Give medicines only as directed by the health care provider.  Monitor driving abilities. Do not allow the person to drive when safe driving is no longer possible.  Register with an identification program which could provide location assistance in the event of a missing person situation. SEEK MEDICAL CARE IF:   New behavioral problems start such as moodiness, aggressiveness, or seeing things that are not there (hallucinations).  Any new problem with brain function happens. This includes problems with balance, speech, or falling a lot.  Problems with swallowing develop.  Any symptoms of other illness happen. Small changes or worsening in any aspect of brain function can be a sign that the illness is getting worse. It can also be a sign of another medical illness such as infection. Seeing a health care provider right away is important. SEEK IMMEDIATE MEDICAL CARE IF:   A fever develops.  New or worsened confusion develops.  New or worsened sleepiness develops.  Staying awake becomes hard to do.   This information is not intended to replace advice given to you by your health care provider. Make sure you discuss  any questions you have with your health care provider.   Document Released: 04/09/2001 Document Revised: 11/04/2014 Document Reviewed: 03/11/2011 Elsevier Interactive Patient Education [employer].

## 2015-10-30 DIAGNOSIS — E870 Hyperosmolality and hypernatremia: Secondary | ICD-10-CM | POA: Diagnosis not present

## 2015-10-30 DIAGNOSIS — E876 Hypokalemia: Secondary | ICD-10-CM | POA: Diagnosis not present

## 2015-10-30 DIAGNOSIS — G4700 Insomnia, unspecified: Secondary | ICD-10-CM | POA: Diagnosis not present

## 2015-10-30 DIAGNOSIS — G9340 Encephalopathy, unspecified: Secondary | ICD-10-CM

## 2015-10-30 LAB — CBC WITH DIFFERENTIAL/PLATELET
BASOS ABS: 0.1 10*3/uL (ref 0.0–0.1)
Basophils Relative: 1 %
Eosinophils Absolute: 0.3 10*3/uL (ref 0.0–0.7)
Eosinophils Relative: 6 %
HCT: 37.1 % — ABNORMAL LOW (ref 39.0–52.0)
HEMOGLOBIN: 12.1 g/dL — AB (ref 13.0–17.0)
LYMPHS ABS: 1.6 10*3/uL (ref 0.7–4.0)
LYMPHS PCT: 27 %
MCH: 32 pg (ref 26.0–34.0)
MCHC: 32.6 g/dL (ref 30.0–36.0)
MCV: 98.1 fL (ref 78.0–100.0)
Monocytes Absolute: 0.6 10*3/uL (ref 0.1–1.0)
Monocytes Relative: 9 %
NEUTROS ABS: 3.5 10*3/uL (ref 1.7–7.7)
NEUTROS PCT: 57 %
PLATELETS: 236 10*3/uL (ref 150–400)
RBC: 3.78 MIL/uL — AB (ref 4.22–5.81)
RDW: 14 % (ref 11.5–15.5)
WBC: 6.2 10*3/uL (ref 4.0–10.5)

## 2015-10-30 LAB — BASIC METABOLIC PANEL
ANION GAP: 10 (ref 5–15)
BUN: 20 mg/dL (ref 6–20)
CALCIUM: 8.5 mg/dL — AB (ref 8.9–10.3)
CHLORIDE: 113 mmol/L — AB (ref 101–111)
CO2: 24 mmol/L (ref 22–32)
Creatinine, Ser: 0.83 mg/dL (ref 0.61–1.24)
GFR calc Af Amer: >60 mL/min (ref >60)
GLUCOSE: 83 mg/dL (ref 65–99)
POTASSIUM: 4 mmol/L (ref 3.5–5.1)
Sodium: 147 mmol/L — ABNORMAL HIGH (ref 135–145)

## 2015-10-30 LAB — GLUCOSE, CAPILLARY
GLUCOSE-CAPILLARY: 110 mg/dL — AB (ref 65–99)
Glucose-Capillary: 134 mg/dL — ABNORMAL HIGH (ref 65–99)
Glucose-Capillary: 99 mg/dL (ref 65–99)
Glucose-Capillary: 108 mg/dL — ABNORMAL HIGH (ref 65–99)

## 2015-10-30 LAB — C DIFFICILE QUICK SCREEN W PCR REFLEX
C DIFFICLE (CDIFF) ANTIGEN: NEGATIVE
C Diff interpretation: NEGATIVE
C Diff toxin: NEGATIVE

## 2015-10-30 MED ORDER — ISOSORBIDE MONONITRATE ER 30 MG PO TB24
30.0000 mg | ORAL_TABLET | Freq: Every day | ORAL | Status: DC
  Administered 2015-10-30 – 2015-11-01 (×4): 30 mg via ORAL
  Filled 2015-10-30 (×4): qty 1

## 2015-10-30 MED ORDER — POTASSIUM CHLORIDE 20 MEQ/15ML (10%) PO SOLN
40.0000 meq | Freq: Once | ORAL | Status: AC
  Administered 2015-10-30: 40 meq via ORAL
  Filled 2015-10-30: qty 30

## 2015-10-30 MED ORDER — DEXTROSE-NACL 5-0.45 % IV SOLN
INTRAVENOUS | Status: DC
  Administered 2015-10-30 (×2): via INTRAVENOUS

## 2015-10-30 MED ORDER — METOPROLOL SUCCINATE ER 25 MG PO TB24
25.0000 mg | ORAL_TABLET | Freq: Every day | ORAL | Status: DC
  Administered 2015-10-30: 25 mg via ORAL
  Filled 2015-10-30 (×2): qty 1

## 2015-10-30 MED ORDER — POTASSIUM CHLORIDE IN NACL 40-0.9 MEQ/L-% IV SOLN
INTRAVENOUS | Status: DC
  Administered 2015-10-30: 100 mL/h via INTRAVENOUS
  Filled 2015-10-30: qty 1000

## 2015-10-30 MED ORDER — LORAZEPAM 2 MG/ML IJ SOLN: 0.5000 mg | INTRAMUSCULAR | Status: DC | PRN

## 2015-10-30 MED ORDER — PHENYLEPHRINE-APAP-GUAIFENESIN 10-650-400 MG/20ML PO LIQD: 1.0000 | Freq: Two times a day (BID) | ORAL | Status: DC | PRN

## 2015-10-30 MED ORDER — SERTRALINE HCL 25 MG PO TABS
25.0000 mg | ORAL_TABLET | Freq: Every day | ORAL | Status: DC
  Administered 2015-10-30 – 2015-11-01 (×4): 25 mg via ORAL
  Filled 2015-10-30 (×4): qty 1

## 2015-10-30 MED ORDER — DOCUSATE SODIUM 100 MG PO CAPS: 100.0000 mg | ORAL_CAPSULE | Freq: Two times a day (BID) | ORAL | Status: DC | PRN

## 2015-10-30 MED ORDER — ENOXAPARIN SODIUM 30 MG/0.3ML ~~LOC~~ SOLN
30.0000 mg | SUBCUTANEOUS | Status: DC
  Administered 2015-10-30 – 2015-11-01 (×3): 30 mg via SUBCUTANEOUS
  Filled 2015-10-30 (×3): qty 0.3

## 2015-10-30 MED ORDER — GUAIFENESIN 100 MG/5ML PO SOLN: 20.0000 mL | Freq: Two times a day (BID) | ORAL | Status: DC | PRN

## 2015-10-30 MED ORDER — ZOLPIDEM TARTRATE 5 MG PO TABS: 5.0000 mg | ORAL_TABLET | Freq: Every evening | ORAL | Status: DC | PRN

## 2015-10-30 MED ORDER — DONEPEZIL HCL 10 MG PO TABS
10.0000 mg | ORAL_TABLET | Freq: Every day | ORAL | Status: DC
  Administered 2015-10-30 – 2015-10-31 (×3): 10 mg via ORAL
  Filled 2015-10-30 (×4): qty 1

## 2015-10-30 MED ORDER — METRONIDAZOLE 500 MG PO TABS
500.0000 mg | ORAL_TABLET | Freq: Three times a day (TID) | ORAL | Status: DC
  Administered 2015-10-30 (×3): 500 mg via ORAL
  Filled 2015-10-30 (×5): qty 1

## 2015-10-30 MED ORDER — METOPROLOL SUCCINATE ER 25 MG PO TB24
25.0000 mg | ORAL_TABLET | Freq: Every day | ORAL | Status: DC
  Administered 2015-10-31: 25 mg via ORAL
  Filled 2015-10-30 (×2): qty 1

## 2015-10-30 MED ORDER — POTASSIUM CHLORIDE 20 MEQ PO PACK
40.0000 meq | PACK | Freq: Once | ORAL | Status: DC
  Filled 2015-10-30: qty 2

## 2015-10-30 MED ORDER — CIPROFLOXACIN HCL 500 MG PO TABS
500.0000 mg | ORAL_TABLET | Freq: Two times a day (BID) | ORAL | Status: DC
  Administered 2015-10-30 (×2): 500 mg via ORAL
  Filled 2015-10-30 (×4): qty 1

## 2015-10-30 NOTE — Care Management Obs Status (Signed)
[facility] NOTIFICATION   Patient Details  Name: [name] Rice MRN: [id number] Date of Birth: Jan 28, 1925   Medicare Observation Status Notification Given:  Yes    Guadalupe Maple, RN 10/30/2015, 3:09 PM

## 2015-10-30 NOTE — Progress Notes (Signed)
PROGRESS NOTE    [name] Rice [id number] DOB: [birth year] DOA: 10/29/2015 PCP: No primary care provider on file.  HPI/Brief narrative 82 year old male with history of prostate cancer, stroke, alcohol abuse, dementia, DM, presented to the ED due to worsening confusion. Apparently the patient has been agitated, restless and having worsening insomnia at night. He has not been sleeping at all, digging through the garbage at home. As per records, his caregivers discussed with home health nurse who recommended bringing him to the hospital for evaluation and management. Patient was recently hospitalized 10/17/15-10/23/15 for possible colitis versus UTI and was discharged on a course of oral Cipro, Flagyl, and recommended outpatient urology consultation for evaluation of bladder wall thickening. In the ED, patient noted to be hypernatremic at 146, hypokalemic. Admitted for further evaluation and management.   Assessment/Plan:  Acute encephalopathy - May be multifactorial secondary to hypernatremia complicating underlying dementia and sundowning. Quinolones may have also contributed to this. - No focal deficits on exam. - Treat hypernatremia and monitor. - Seems to have improved currently. Patient is alert and oriented 2, pleasant, calm and cooperative. Monitor for sundowning. - Urine microscopy not suggestive of UTI. Chest x-ray without acute findings.  Hypernatremic dehydration - Hydrate with IV hypotonic fluids and aim to correct sodium by approximately 8 mEq per 24 hours. Follow BMP in a.m. -Likely secondary to poor oral intake and diarrhea.  Diarrhea - May be from recent colitis. C. difficile testing negative. DC all antibiotics. Add probiotics and may consider when necessary Imodium  Insomnia - Trial of Ambien  History of alcohol abuse - Not sure if this is actively the case. No features of withdrawal  Bladder wall thickening - Given history of prostate cancer, will need outpatient  urology consultation and follow-up depending on how aggressive the family wishes to be.  Diabetes mellitus - Monitor CBGs and place on SSI for needed.  History of prostate cancer    DVT prophylaxis: Lovenox  Code Status: Full  Family Communication: None at bedside  Disposition Plan:  DC home when medically stable   Consultants: None  Procedures: None  Antibiotics:  Cipro and Flagyl-DC'd   Subjective: Pleasant and cooperative. Denies complaints. As per nursing, 3-4 episodes of diarrhea overnight.  Objective: Filed Vitals:   10/29/15 2117 10/29/15 2236 10/30/15 0635 10/30/15 1525  BP: 115/97 155/74 96/60 93/44   Pulse: 83 69 77 66  Temp: 98.3 F (36.8 C) 97.5 F (36.4 C) 97.7 F (36.5 C) 98.6 F (37 C)  TempSrc: Oral Oral Oral Oral  Resp: 18 17 18 18   Height:  5' (1.524 m)    Weight:  44.044 kg (97 lb 1.6 oz)    SpO2: 100% 98% 96% 94%    Intake/Output Summary (Last 24 hours) at 10/30/15 1534 Last data filed at 10/30/15 0847  Gross per 24 hour  Intake    600 ml  Output      0 ml  Net    600 ml   Filed Weights   10/29/15 2236  Weight: 44.044 kg (97 lb 1.6 oz)     Exam:  General exam: Elderly male, frail, lying comfortably in bed without distress.  Respiratory system: Clear. No increased work of breathing. Cardiovascular system: S1 & S2 heard, RRR. No JVD, murmurs, gallops, clicks or pedal edema. Gastrointestinal system: Abdomen is nondistended, soft and nontender. Normal bowel sounds heard. Central nervous system: Alert and oriented Self and partly to place. No focal neurological deficits. Extremities: Symmetric 5 x 5 power.  Data Reviewed: Basic Metabolic Panel:  Recent Labs Lab 10/29/15 1558 10/29/15 2130 10/30/15 0515  NA 146*  --  147*  K 3.1*  --  4.0  CL 110  --  113*  CO2 29  --  24  GLUCOSE 109*  --  83  BUN 25*  --  20  CREATININE 0.82  --  0.83  CALCIUM 9.0  --  8.5*  MG  --  1.9  --   PHOS  --  2.7  --    Liver Function  Tests:  Recent Labs Lab 10/29/15 1558  AST 24  ALT 23  ALKPHOS 40  BILITOT 0.5  PROT 6.6  ALBUMIN 4.0   No results for input(s): LIPASE, AMYLASE in the last 168 hours. No results for input(s): AMMONIA in the last 168 hours. CBC:  Recent Labs Lab 10/29/15 1558 10/30/15 0515  WBC 5.5 6.2  NEUTROABS 3.3 3.5  HGB 13.1 12.1*  HCT 38.5* 37.1*  MCV 98.2 98.1  PLT 245 236   Cardiac Enzymes: No results for input(s): CKTOTAL, CKMB, CKMBINDEX, TROPONINI in the last 168 hours. BNP (last 3 results) No results for input(s): PROBNP in the last 8760 hours. CBG:  Recent Labs Lab 10/30/15 0741 10/30/15 1225  GLUCAP 134* 99    Recent Results (from the past 240 hour(s))  C difficile quick scan w PCR reflex     Status: None   Collection Time: 10/30/15  6:40 AM  Result Value Ref Range Status   C Diff antigen NEGATIVE NEGATIVE Final   C Diff toxin NEGATIVE NEGATIVE Final   C Diff interpretation Negative for toxigenic C. difficile  Final          Studies: Dg Chest 2 View  10/29/2015  CLINICAL DATA:  Altered mental status. EXAM: CHEST  2 VIEW COMPARISON:  07/25/2015, 08/04/2013 and 07/15/2013 FINDINGS: Heart size and pulmonary vascularity are normal. Calcification in the thoracic aorta. Bilateral apical pleural thickening, right more than left. Large hiatal hernia.  Old thoracic compression fractures. IMPRESSION: No acute abnormality. Large hiatal hernia. Old compression fractures. Electronically Signed   By: Lorriane Shire M.D.   On: 10/29/2015 16:57        Scheduled Meds: . ciprofloxacin  500 mg Oral BID  . donepezil  10 mg Oral QHS  . enoxaparin (LOVENOX) injection  30 mg Subcutaneous Q24H  . isosorbide mononitrate  30 mg Oral Daily  . metoprolol succinate  25 mg Oral QHS  . metroNIDAZOLE  500 mg Oral 3 times per day  . sertraline  25 mg Oral Daily   Continuous Infusions: . dextrose 5 % and 0.45% NaCl 75 mL/hr at 10/30/15 [id number]    Principal Problem:   Altered  mental status Active Problems:   Alcohol abuse   Alzheimer's disease   Insomnia   Colitis   Bladder wall thickening    Time spent: 40 minutes.    Vernell Leep, MD, FACP, FHM. [facility] Pager [phone]  If 7PM-7AM, please contact night-coverage [email or web address] Password [email or web address] 10/30/2015, 3:34 PM    LOS: 1 day

## 2015-10-30 NOTE — Progress Notes (Signed)
Contacted [name], [place] to discuss d/c plans. She was unclear about d/c to home vs SNF, she wants him to d/c home to take care of financial issues that are pending but then wants him to go to SNF as she has difficulty caring for him. He was active with [facility] prior to admission. Tried to explain the process of d/c planning and that he could only go to SNF if he qualified per insurance otherwise it would be private pay, she understood that "people get a check for that". She wants someone to help her with durable POA which she states she has. Will consult CSW to follow up with her about placement questions then readdress d/c plans. Discussed with her on phone that patient states has changed to obs and I would leave notice in room with patient for her.

## 2015-10-31 DIAGNOSIS — G9340 Encephalopathy, unspecified: Secondary | ICD-10-CM | POA: Diagnosis not present

## 2015-10-31 DIAGNOSIS — E876 Hypokalemia: Secondary | ICD-10-CM

## 2015-10-31 DIAGNOSIS — E870 Hyperosmolality and hypernatremia: Secondary | ICD-10-CM | POA: Diagnosis not present

## 2015-10-31 LAB — BASIC METABOLIC PANEL
Anion gap: 4 — ABNORMAL LOW (ref 5–15)
BUN: 16 mg/dL (ref 6–20)
CALCIUM: 8 mg/dL — AB (ref 8.9–10.3)
CO2: 23 mmol/L (ref 22–32)
Chloride: 112 mmol/L — ABNORMAL HIGH (ref 101–111)
Creatinine, Ser: 0.82 mg/dL (ref 0.61–1.24)
GFR calc Af Amer: >60 mL/min (ref >60)
GLUCOSE: 114 mg/dL — AB (ref 65–99)
Potassium: 3.4 mmol/L — ABNORMAL LOW (ref 3.5–5.1)
Sodium: 139 mmol/L (ref 135–145)

## 2015-10-31 LAB — GLUCOSE, CAPILLARY
GLUCOSE-CAPILLARY: 93 mg/dL (ref 65–99)
GLUCOSE-CAPILLARY: 97 mg/dL (ref 65–99)
Glucose-Capillary: 104 mg/dL — ABNORMAL HIGH (ref 65–99)
Glucose-Capillary: 90 mg/dL (ref 65–99)

## 2015-10-31 MED ORDER — POTASSIUM CHLORIDE CRYS ER 20 MEQ PO TBCR
40.0000 meq | EXTENDED_RELEASE_TABLET | Freq: Once | ORAL | Status: AC
  Administered 2015-10-31: 40 meq via ORAL
  Filled 2015-10-31: qty 2

## 2015-10-31 NOTE — Progress Notes (Addendum)
[id number] to Attending to inform that patient is not meeting glines for a continued stay.  Md is in disagreement with decision of the medical directors for patient to be discharged to home or snf. tct-poa-[name] Oletta Lamas states that she has a broken leg and is not able to discuss what the outcome for the patient should until she gets some rest.  Caller is able to follow the conversation but became very upset when told that patient needs to be discharged as planned on [id number]/she will not discuss this until she has an hour to sleep due to taking pain meds. Agreed to call back in one hour.  Have made medical director aware of this and spoke with the clinical social worker concerning the need to find placement.  CSW states that she tried to talk with the POA concerning immediate placement but refused to until she get some rest.

## 2015-10-31 NOTE — Progress Notes (Signed)
[id number]/Rhonda Davis,RN,BSN,CCM: tct-[name] the P.O.A.  No answer except voice mail./requested call back before 1630 today. Did inform of need to directly communicate with her as to his welfare and her ability to make decisions for this patient/

## 2015-10-31 NOTE — Clinical Social Work Note (Signed)
CSW spoke with pt's niece/caregiver, [name] [phone], who confirms pt was living with her prior to this hospitalization. Pt had been living with her for about 7 days. Prior to this [name] reports pt was hospitalized at [facility] for "several infections" and was diagnosed with alzheimer's (he was living alone at time of admission to [facility]). [name] states she would like to place pt on her own and doesn't want anyone in the hospital for initiate SNF search. She states she's been speaking with someone at [place], [place]. After this conversation, pt was evaluated by PT and Home Health was recommended. CSW signing off for now as SNF wasn't recommended by PT and pt's caregiver would like to take pt home pending he's doing better.  Cindra Presume, LCSW [phone] Hospital psychiatric & 5E, 5W [id number] Licensed Clinical Social Worker

## 2015-10-31 NOTE — Progress Notes (Signed)
[id number]:  tcf-[name] Rice/can not bring patient home or make a decision until in the morning.  Has had pain meds and is unablre to make a decision/  ABN reviewed with called on the phone and will leave paper in the room for her signature.  Does state that she understands that [place] may not pay for this day.

## 2015-10-31 NOTE — Progress Notes (Signed)
PROGRESS NOTE    [name] Rice [id number] DOB: 1924-12-07 DOA: 10/29/2015 PCP: No primary care provider on file.  HPI/Brief narrative 82 year old male with history of prostate cancer, stroke, alcohol abuse, dementia, DM, presented to the ED due to worsening confusion. Apparently the patient has been agitated, restless and having worsening insomnia at night. He has not been sleeping at all, digging through the garbage at home. As per records, his caregivers discussed with home health nurse who recommended bringing him to the hospital for evaluation and management. Patient was recently hospitalized 10/17/15-10/23/15 for possible colitis versus UTI and was discharged on a course of oral Cipro, Flagyl, and recommended outpatient urology consultation for evaluation of bladder wall thickening. In the ED, patient noted to be hypernatremic at 146, hypokalemic. Admitted for further evaluation and management. Dehydration and hypernatremia have resolved. Patient is medically stable for discharge today. However unsafe to discharge home-apparently lives alone and niece helps out but she has a broken leg. Discussed with case management Medical Director on 10/31/2015 who is trying to coordinate discharge to SNF when bed available.   Assessment/Plan:  Acute encephalopathy - May be multifactorial secondary to hypernatremia complicating underlying dementia and sundowning. Quinolones may have also contributed to this. - No focal deficits on exam. - Hypernatremia treated and resolved. - Urine microscopy not suggestive of UTI. Chest x-ray without acute findings. - Seems to have resolved and current mental status may be his baseline.  Hypernatremic dehydration - Likely secondary to poor oral intake and diarrhea. - Hydrated with hypotonic IV fluids and resolved.  Diarrhea - May be from recent colitis. C. difficile testing negative. DC'ed all antibiotics.  - As per RN, resolved. Monitor  Insomnia - Trial of  Ambien  History of alcohol abuse - Not sure if this is actively the case. No features of withdrawal  Bladder wall thickening - Given history of prostate cancer, will need outpatient urology consultation and follow-up depending on how aggressive the family wishes to be.  Diabetes mellitus - Monitor CBGs and place on SSI for needed. Controlled.  History of prostate cancer  Hypokalemia - Replace and follow    DVT prophylaxis: Lovenox  Code Status: Full  Family Communication: None at bedside. Unable to reach niece, Ms. [name] Disposition Plan:  Patient medically stable for discharge to SNF when bed available.   Consultants: None   Procedures: None   Antibiotics:  Cipro and Flagyl-DC'd   Subjective: Patient denies complaints. As per RN, 1 small BM but diarrhea seems to have resolved. No agitation or acute issues reported.  Objective: Filed Vitals:   10/30/15 1525 10/30/15 2256 10/31/15 0722 10/31/15 1614  BP: 93/44 111/65 127/69 120/67  Pulse: 66 64 65 77  Temp: 98.6 F (37 C) 97.5 F (36.4 C) 98.1 F (36.7 C) 98.4 F (36.9 C)  TempSrc: Oral Oral Oral Oral  Resp: 18 18 18 20   Height:      Weight:      SpO2: 94% 96% 95% 97%    Intake/Output Summary (Last 24 hours) at 10/31/15 1720 Last data filed at 10/31/15 [id number]  Gross per 24 hour  Intake 1141.25 ml  Output      0 ml  Net 1141.25 ml   Filed Weights   10/29/15 2236  Weight: 44.044 kg (97 lb 1.6 oz)     Exam:  General exam: Elderly male, frail, lying comfortably in bed without distress.  Respiratory system: Clear. No increased work of breathing. Cardiovascular system: S1 & S2 heard,  RRR. No JVD, murmurs, gallops, clicks or pedal edema. Gastrointestinal system: Abdomen is nondistended, soft and nontender. Normal bowel sounds heard. Central nervous system: Alert and oriented to self and partly to place. No focal neurological deficits. Extremities: Symmetric 5 x 5 power.   Data  Reviewed: Basic Metabolic Panel:  Recent Labs Lab 10/29/15 1558 10/29/15 2130 10/30/15 0515 10/31/15 0550  NA 146*  --  147* 139  K 3.1*  --  4.0 3.4*  CL 110  --  113* 112*  CO2 29  --  24 23  GLUCOSE 109*  --  83 114*  BUN 25*  --  20 16  CREATININE 0.82  --  0.83 0.82  CALCIUM 9.0  --  8.5* 8.0*  MG  --  1.9  --   --   PHOS  --  2.7  --   --    Liver Function Tests:  Recent Labs Lab 10/29/15 1558  AST 24  ALT 23  ALKPHOS 40  BILITOT 0.5  PROT 6.6  ALBUMIN 4.0   No results for input(s): LIPASE, AMYLASE in the last 168 hours. No results for input(s): AMMONIA in the last 168 hours. CBC:  Recent Labs Lab 10/29/15 1558 10/30/15 0515  WBC 5.5 6.2  NEUTROABS 3.3 3.5  HGB 13.1 12.1*  HCT 38.5* 37.1*  MCV 98.2 98.1  PLT 245 236   Cardiac Enzymes: No results for input(s): CKTOTAL, CKMB, CKMBINDEX, TROPONINI in the last 168 hours. BNP (last 3 results) No results for input(s): PROBNP in the last 8760 hours. CBG:  Recent Labs Lab 10/30/15 1640 10/30/15 2241 10/31/15 0741 10/31/15 1208 10/31/15 1716  GLUCAP 108* 110* 104* 90 93    Recent Results (from the past 240 hour(s))  C difficile quick scan w PCR reflex     Status: None   Collection Time: 10/30/15  6:40 AM  Result Value Ref Range Status   C Diff antigen NEGATIVE NEGATIVE Final   C Diff toxin NEGATIVE NEGATIVE Final   C Diff interpretation Negative for toxigenic C. difficile  Final          Studies: No results found.      Scheduled Meds: . donepezil  10 mg Oral QHS  . enoxaparin (LOVENOX) injection  30 mg Subcutaneous Q24H  . isosorbide mononitrate  30 mg Oral Daily  . metoprolol succinate  25 mg Oral QHS  . sertraline  25 mg Oral Daily   Continuous Infusions:    Principal Problem:   Altered mental status Active Problems:   Alcohol abuse   Alzheimer's disease   Insomnia   Colitis   Bladder wall thickening    Time spent: 20 minutes.    Vernell Leep, MD, FACP,  FHM. [facility] Pager [phone]  If 7PM-7AM, please contact night-coverage [email or web address] Password [email or web address] 10/31/2015, 5:20 PM    LOS: 2 days

## 2015-10-31 NOTE — Evaluation (Signed)
Physical Therapy Evaluation Patient Details Name: [name] Rice MRN: [id number] DOB: 04/10/1925 Today's Date: 10/31/2015   History of Present Illness  82 yo male admitted with AMS. Hx of dementia, prostate cancer, CVa, ETOH abuse, DM  Clinical Impression  On eval pt was Sup-Min guard assist for mobility-walked ~150 feet with RW. Pt tolerated activity fairly well. Moves quickly and uncontrolled at times. Pt participated well with session (was pleasant and followed commands). No family present during session. Recommend HHPT (for general strength and balance training) and intermittent supervision/assist for ADLs. Per MD, caregiver has an ankle injury? Pt does have some balance deficits and is at risk for falls. Would benefit from, at the very least, intermittent supervision/assist. However, do not feel pt is safe to be completely alone at home.    Follow Up Recommendations Home health PT;Supervision - Intermittent (may need to consider SNF if pt will not have appropriate supervision/assist available at home. [place] may be beneficial as well)    Equipment Recommendations  Rolling Walker (if pt does not already have one)    Recommendations for Other Services       Precautions / Restrictions Precautions Precautions: Fall Restrictions Weight Bearing Restrictions: No      Mobility  Bed Mobility Overal bed mobility: Independent                Transfers Overall transfer level: Needs assistance   Transfers: Sit to/from Stand Sit to Stand: Supervision         General transfer comment: for safety. pt moves quickly  Ambulation/Gait Ambulation/Gait assistance: Min guard Ambulation Distance (Feet): 150 Feet Assistive device: Rolling walker (2 wheeled) Gait Pattern/deviations: Step-through pattern;Trunk flexed;Decreased stride length     General Gait Details: Began with RW due to pt stating he used one prior to admission. Pt tends to move quickly and uncontrolled at times  however he did not require external assist. Attempted to have pt walk a little farther , without walker, but pt declined.   Stairs            Wheelchair Mobility    Modified Rankin (Stroke Patients Only)       Balance Overall balance assessment: Needs assistance         Standing balance support: During functional activity Standing balance-Leahy Scale: Fair Standing balance comment: Had pt perfrom static standing with EO/EC, narrow BOS, withstanding of perturbations-LOB with perturbations. Pt perfromed 360 degree turn, picked up object-both with close guard assist                              Pertinent Vitals/Pain Pain Assessment: No/denies pain    Home Living Family/patient expects to be discharged to:: Private residence Living Arrangements: Alone Available Help at Discharge: Available PRN/intermittently;Family Type of Home: Mobile home Home Access: Stairs to enter Entrance Stairs-Rails: Psychiatric nurse of Steps: 3 Home Layout: One level Home Equipment: Cane - single point;Grab bars - tub/shower;Shower seat - built in;Wheelchair - manual Additional Comments: info used from last therapy evaluations ~10/20/15    Prior Function Level of Independence: Independent with assistive device(s)         Comments: pt stated he has "one of those" referring to walker-unsure      Hand Dominance        Extremity/Trunk Assessment               Lower Extremity Assessment: Generalized weakness      Cervical /  Trunk Assessment: Kyphotic  Communication   Communication: No difficulties  Cognition Arousal/Alertness: Awake/alert Behavior During Therapy: WFL for tasks assessed/performed Overall Cognitive Status: History of cognitive impairments - at baseline                      General Comments      Exercises        Assessment/Plan    PT Assessment Patient needs continued PT services  PT Diagnosis Difficulty  walking;Generalized weakness   PT Problem List Decreased strength;Decreased balance;Decreased activity tolerance;Decreased mobility;Decreased cognition  PT Treatment Interventions DME instruction;Gait training;Functional mobility training;Therapeutic activities;Patient/family education;Balance training;Therapeutic exercise   PT Goals (Current goals can be found in the Care Plan section) Acute Rehab PT Goals Patient Stated Goal: to get some rest PT Goal Formulation: With patient Time For Goal Achievement: 11/14/15 Potential to Achieve Goals: Good    Frequency Min 3X/week   Barriers to discharge        Co-evaluation               End of Session Equipment Utilized During Treatment: Gait belt   Patient left: in bed;with call bell/phone within reach;with bed alarm set      Functional Assessment Tool Used: clinical judgement Functional Limitation: Mobility: Walking and moving around Mobility: Walking and Moving Around Current Status [phone]): At least 1 percent but less than 20 percent impaired, limited or restricted Mobility: Walking and Moving Around Goal Status [phone]): At least 1 percent but less than 20 percent impaired, limited or restricted    Time: [id number] PT Time Calculation (min) (ACUTE ONLY): 11 min   Charges:   PT Evaluation $Initial PT Evaluation Tier I: 1 Procedure     PT G Codes:   PT G-Codes **NOT FOR INPATIENT CLASS** Functional Assessment Tool Used: clinical judgement Functional Limitation: Mobility: Walking and moving around Mobility: Walking and Moving Around Current Status [id number]): At least 1 percent but less than 20 percent impaired, limited or restricted Mobility: Walking and Moving Around Goal Status [phone]): At least 1 percent but less than 20 percent impaired, limited or restricted    Weston Anna, MPT Pager: [phone]

## 2015-11-01 DIAGNOSIS — F1010 Alcohol abuse, uncomplicated: Secondary | ICD-10-CM | POA: Diagnosis not present

## 2015-11-01 DIAGNOSIS — R400 Somnolence: Secondary | ICD-10-CM | POA: Diagnosis not present

## 2015-11-01 DIAGNOSIS — N3289 Other specified disorders of bladder: Secondary | ICD-10-CM | POA: Diagnosis not present

## 2015-11-01 LAB — RENAL FUNCTION PANEL
Albumin: 2.9 g/dL — ABNORMAL LOW (ref 3.5–5.0)
Anion gap: 8 (ref 5–15)
BUN: 14 mg/dL (ref 6–20)
CHLORIDE: 110 mmol/L (ref 101–111)
CO2: 21 mmol/L — AB (ref 22–32)
CREATININE: 0.81 mg/dL (ref 0.61–1.24)
Calcium: 8.1 mg/dL — ABNORMAL LOW (ref 8.9–10.3)
GFR calc non Af Amer: >60 mL/min (ref >60)
Glucose, Bld: 89 mg/dL (ref 65–99)
POTASSIUM: 3.6 mmol/L (ref 3.5–5.1)
Phosphorus: 2.3 mg/dL — ABNORMAL LOW (ref 2.5–4.6)
Sodium: 139 mmol/L (ref 135–145)

## 2015-11-01 LAB — GLUCOSE, CAPILLARY
GLUCOSE-CAPILLARY: 100 mg/dL — AB (ref 65–99)
Glucose-Capillary: 96 mg/dL (ref 65–99)

## 2015-11-01 NOTE — Progress Notes (Signed)
CSW was notified by 5th floor nurse secretary that patient is up for discharge. She states that nurse and staff have informed niece/ [name] Rice that patient is up for discharge. However, she is refusing to pick the patient up.  CSW reached out to [name] Rice/ niece at [phone] and informed her that the patient is clear and is up for discharge. Initially, [name] Rice stated that she cannot come due to her having leg issues. Also, she stated that there would be no other family members available to pick the patient up. Niece states that she has spoke with someone of [employer] today and that they are "aware" of her health issues regarding her leg. She states that [employer] told her that she could pick the patient up by morning.  CSW informed [name] Rice that this would be considered abandonment and the hospital is not an appropriate place for a patient to stay for shelter reasons. [name] Rice then stated that if the patient could come by [facility]AR that he would be welcomed back home. However, she states to call her before [name] Rice is home to ensure that she is there.   CSW made nurse aware.   Niece states that the address listed in the chart is not correct. She states that their address is [address] In [place]. Niece states that she would eventually like the patient to be placed in a facility.   [name] Rice [id number] ED CSW 11/01/2015 7:46 PM

## 2015-11-01 NOTE — Discharge Summary (Signed)
Physician Discharge Summary  [name] Rice [id number] DOB: [birth year] DOA: 10/29/2015  PCP: No primary care provider on file.  Admit date: 10/29/2015 Discharge date: 11/01/2015  Recommendations for Outpatient Follow-up:  1. Pt will need to follow up with PCP in 1-2 weeks post discharge 2. Please obtain BMP to evaluate electrolytes and kidney function, K level  3. Please also check CBC to evaluate Hg and Hct levels  Discharge Diagnoses:  Principal Problem:   Altered mental status Active Problems:   Alcohol abuse   Alzheimer's disease   Insomnia   Colitis   Bladder wall thickening  Discharge Condition: Stable  Diet recommendation: Heart healthy diet discussed in details   History of present illness:  82 year old male with history of prostate cancer, stroke, alcohol abuse, dementia, DM, presented to the ED due to worsening confusion, agitation, restlessness and worsening insomnia. As per records, his caregivers discussed with home health nurse who recommended bringing him to the hospital for evaluation and management. Patient was recently hospitalized 10/17/15-10/23/15 for possible colitis versus UTI and was discharged on a course of oral Cipro, Flagyl, and recommended outpatient urology consultation for evaluation of bladder wall thickening.   In the ED, patient noted to be hypernatremic at 146, hypokalemic. Admitted for further evaluation and management.   Assessment/Plan:  Acute encephalopathy - May be multifactorial secondary to hypernatremia complicating underlying dementia and sundowning. Quinolones may have also contributed to this. - No focal deficits on exam. - Hypernatremia treated and resolved. - Urine microscopy not suggestive of UTI. Chest x-ray without acute findings. - Seems to have resolved and current mental status may be his baseline. - plan to d/c today SNF vs home with supervision   Hypernatremic dehydration - Likely secondary to poor oral intake and  diarrhea. - Hydrated with hypotonic IV fluids and resolved.  Diarrhea - May be from recent colitis. C. difficile testing negative.  - resolved   Insomnia - seems to be tolerating Ambien well   History of alcohol abuse - Not sure if this is actively the case - No signs of withdrawal while inpatient   Bladder wall thickening - Given history of prostate cancer, will need outpatient urology consultation and follow-up depending on how aggressive the family wishes to be.  Diabetes mellitus - diet controlled   History of prostate cancer  Hypokalemia - supplemented   Code Status: Full  Family Communication: None at bedside. Niece contacted, awaiting call back  Disposition Plan: Patient medically stable for discharge to SNF when bed available.   Procedures/Studies: Dg Chest 2 View 10/29/2015   No acute abnormality. Large hiatal hernia. Old compression fractures.   US Pelvis Limited 10/18/2015  No evidence of an inguinal hernia.  Ct Abdomen Pelvis W Contrast 10/18/2015  Under distention versus colitis. Correlation with clinical exam and stool cultures recommended. No bowel obstruction. Normal appendix. Moderate-sized hiatal hernia. Diffuse bladder wall thickening sequela of chronic bladder outlet obstruction. Correlation with urinalysis recommended to exclude superimposed UTI.  Discharge Exam: Filed Vitals:   10/31/15 2120 11/01/15 0640  BP: 133/76 130/70  Pulse: 86 84  Temp: 98.4 F (36.9 C) 98.4 F (36.9 C)  Resp: 19 19   Filed Vitals:   10/31/15 0722 10/31/15 1614 10/31/15 2120 11/01/15 0640  BP: 127/69 120/67 133/76 130/70  Pulse: 65 77 86 84  Temp: 98.1 F (36.7 C) 98.4 F (36.9 C) 98.4 F (36.9 C) 98.4 F (36.9 C)  TempSrc: Oral Oral Oral Oral  Resp: 18 20 19 19   Height:  Weight:      SpO2: 95% 97% 100% 97%    General: Pt is alert, follows commands appropriately, not in acute distress Cardiovascular: Regular rate and rhythm, no rubs, no  gallops Respiratory: Clear to auscultation bilaterally, no wheezing, no crackles, no rhonchi Abdominal: Soft, non tender, non distended, bowel sounds +, no guarding  Discharge Instructions  Discharge Instructions    Diet - low sodium heart healthy    Complete by:  As directed      Increase activity slowly    Complete by:  As directed             Medication List    STOP taking these medications        ciprofloxacin 500 MG tablet  Commonly known as:  CIPRO     diphenhydramine-acetaminophen 25-500 MG Tabs tablet  Commonly known as:  TYLENOL PM     docusate sodium 100 MG capsule  Commonly known as:  COLACE     eszopiclone 3 MG Tabs  Generic drug:  Eszopiclone     metroNIDAZOLE 500 MG tablet  Commonly known as:  FLAGYL     MUCINEX FAST-MAX COLD & SINUS 10-650-400 MG/20ML Liqd  Generic drug:  Phenylephrine-APAP-Guaifenesin     OVER THE COUNTER MEDICATION     OVER THE COUNTER MEDICATION     OVER THE COUNTER MEDICATION      TAKE these medications        ARTHRITIS PAIN RELIEF RUB EX  Apply 1 application topically daily as needed (arthritis pain.).     carbamide peroxide 6.5 % otic solution  Commonly known as:  DEBROX  Place 5 drops into both ears daily as needed (earwax removal.).     donepezil 10 MG tablet  Commonly known as:  ARICEPT  Take 1 tablet (10 mg total) by mouth at bedtime.     isosorbide mononitrate 30 MG 24 hr tablet  Commonly known as:  IMDUR  Take 30 mg by mouth daily.     metoprolol succinate 25 MG 24 hr tablet  Commonly known as:  TOPROL XL  Take 1 tablet (25 mg total) by mouth daily.     multivitamin with minerals Tabs tablet  Take 1 tablet by mouth daily.     sertraline 25 MG tablet  Commonly known as:  ZOLOFT  Take 1 tablet (25 mg total) by mouth daily.     zolpidem 5 MG tablet  Commonly known as:  AMBIEN  Take 1 tablet (5 mg total) by mouth at bedtime as needed for sleep.           Follow-up Information    Schedule an  appointment as soon as possible for a visit with Your primary care provider.       The results of significant diagnostics from this hospitalization (including imaging, microbiology, ancillary and laboratory) are listed below for reference.     Microbiology: Recent Results (from the past 240 hour(s))  C difficile quick scan w PCR reflex     Status: None   Collection Time: 10/30/15  6:40 AM  Result Value Ref Range Status   C Diff antigen NEGATIVE NEGATIVE Final   C Diff toxin NEGATIVE NEGATIVE Final   C Diff interpretation Negative for toxigenic C. difficile  Final     Labs: Basic Metabolic Panel:  Recent Labs Lab 10/29/15 1558 10/29/15 2130 10/30/15 0515 10/31/15 0550 11/01/15 0545  NA 146*  --  147* 139 139  K 3.1*  --  4.0 3.4*  3.6  CL 110  --  113* 112* 110  CO2 29  --  24 23 21*  GLUCOSE 109*  --  83 114* 89  BUN 25*  --  20 16 14   CREATININE 0.82  --  0.83 0.82 0.81  CALCIUM 9.0  --  8.5* 8.0* 8.1*  MG  --  1.9  --   --   --   PHOS  --  2.7  --   --  2.3*   Liver Function Tests:  Recent Labs Lab 10/29/15 1558 11/01/15 0545  AST 24  --   ALT 23  --   ALKPHOS 40  --   BILITOT 0.5  --   PROT 6.6  --   ALBUMIN 4.0 2.9*   CBC:  Recent Labs Lab 10/29/15 1558 10/30/15 0515  WBC 5.5 6.2  NEUTROABS 3.3 3.5  HGB 13.1 12.1*  HCT 38.5* 37.1*  MCV 98.2 98.1  PLT 245 236   BNP (last 3 results)  Recent Labs  07/25/15 1640  BNP 65.6   CBG:  Recent Labs Lab 10/31/15 0741 10/31/15 1208 10/31/15 1716 10/31/15 2117 11/01/15 0722  GLUCAP 104* 90 93 97 96     SIGNED: Time coordinating discharge: 30 minutes  MAGICK-Eric Morganti, MD  [facility] 11/01/2015, 9:32 AM Pager [phone]  If 7PM-7AM, please contact night-coverage [email or web address] Password [email or web address]

## 2015-11-01 NOTE — Plan of Care (Addendum)
Attending did not print and sign prescriptions for Aricept and Ambien for discharge. NP to floor and Rx printed and signed. Ambulance here to transport pt.   Please note that scripts were printed earlier in the day, placed in chart and discussed with nurse on the floor.   Faye Ramsay, MD  [facility] Pager [phone]  If 7PM-7AM, please contact night-coverage [email or web address] [place], NP [facility]

## 2015-11-01 NOTE — Progress Notes (Signed)
Spoke with pt niece [name] to determine when would arrive to pick up pt.  Niece stated she was awaiting return call from [employer] facility to determine if he met criteria for placement.  Niece states if not, then she or another individual will be at hospital to pick him up today.  Patient aware of arrangements.

## 2015-11-01 NOTE — Progress Notes (Signed)
[id number]-[name] the poa/no answer mail box is full but send message with my number for re-contact./Rhonda Davis,RN, BSN, CCM

## 2015-11-01 NOTE — Clinical Social Work Note (Addendum)
12:45pm- CSW was informed by RN case manager that phone contact was made with pt's niece/caregiver, [name] [phone]. CSW received verbal confirmation that pt can be referred to SNF's in [place] will initiate SNF search immediately as pt is ready for d/c.  1:25pm- CSW consulted with CSW Surveyor, quantity who states pt most likely doesn't meet criteria for SNF placement and should utilize his resources [e.g., pay for in-home aid] and return home. CSW made unit RN case manager aware.   Cindra Presume, LCSW [phone] Hospital psychiatric & 5E, 5W [id number] Licensed Clinical Social Worker

## 2015-11-01 NOTE — Progress Notes (Signed)
Pt discharged to home. [name] agreed to be at home to receive patient after [name] spoke  with [name], SW. Call placed to [facility] for transportation. Prescriptions unavailable for patient. Not in chart or wallaroo. [name], NP on call asked to print prescriptions. Done by MNP. Patient transported to home by [facility]. Left unit on stretcher pushed by [facility] staff. Left in good condition. Discharge information to include prescriptions given to [facility] staff to hand-deliver to [facility] and niece of pt).  [name] made aware of patient's discharge. Vwilliams,rn.

## 2015-11-01 NOTE — Progress Notes (Signed)
[id number] answer voicemail full numerical message with phone [phone] sent/Rhonda Davis,RN,BSN,CCM

## 2015-11-01 NOTE — Progress Notes (Signed)
[id number] Murad Staples,RN,BSB,CCM:  Spoke to her informed that the patient does not meet medical necessary guideline for an admission into ao rehab or skilled nursing facility and that he will need to come home.  [name] wanted to call around to some more snf and to see.I again informed her that he would self pay.  Then said after getting agitated on the phone that she would come and get him today.

## 2015-11-01 NOTE — Progress Notes (Signed)
Spoke with [name], Mr. Frezza' niece on the phone when [name] told this writer that she would not be able to pick patient up tonight. [name] said that she had spoken with the social worker today about 6 to 8 times when she made it aware to social worker that she had injured her leg and could not take care of patient. [name] also said that she had spoken with Education officer, museum of trying to place patient into a facility ( this being main reason why patient was admitted). [name] also said that she had called APS and spoken to her Education officer, museum, who told her that she needed to go to the ER for help for herself and that she [name]) was on her way to the ER when she spoke with this Probation officer. Nursing secretary notified inside SW about this event. SW to contact [place] soon, per NS. [email or web address].m Patient made aware of situation. Vwilliams, rn.

## 2015-11-01 NOTE — Progress Notes (Signed)
[id number] answer voicemail full numerical message with phone [phone] sent/Rhonda Davis,RN,BSN,CCM

## 2015-11-02 ENCOUNTER — Telehealth: Payer: Self-pay | Admitting: *Deleted

## 2015-11-02 ENCOUNTER — Emergency Department (HOSPITAL_COMMUNITY)
Admission: EM | Admit: 2015-11-02 | Discharge: 2015-11-04 | Disposition: A | Discharge status: Home / Self Care | Payer: Medicare HMO | Attending: Emergency Medicine | Admitting: Emergency Medicine

## 2015-11-02 ENCOUNTER — Emergency Department (HOSPITAL_COMMUNITY): Payer: Medicare HMO

## 2015-11-02 ENCOUNTER — Encounter (HOSPITAL_COMMUNITY): Payer: Self-pay | Admitting: Emergency Medicine

## 2015-11-02 DIAGNOSIS — F0390 Unspecified dementia without behavioral disturbance: Secondary | ICD-10-CM | POA: Insufficient documentation

## 2015-11-02 DIAGNOSIS — Z8673 Personal history of transient ischemic attack (TIA), and cerebral infarction without residual deficits: Secondary | ICD-10-CM | POA: Diagnosis not present

## 2015-11-02 DIAGNOSIS — F17200 Nicotine dependence, unspecified, uncomplicated: Secondary | ICD-10-CM | POA: Insufficient documentation

## 2015-11-02 DIAGNOSIS — Z79899 Other long term (current) drug therapy: Secondary | ICD-10-CM | POA: Insufficient documentation

## 2015-11-02 DIAGNOSIS — R1031 Right lower quadrant pain: Secondary | ICD-10-CM | POA: Insufficient documentation

## 2015-11-02 DIAGNOSIS — H9190 Unspecified hearing loss, unspecified ear: Secondary | ICD-10-CM | POA: Diagnosis not present

## 2015-11-02 DIAGNOSIS — F419 Anxiety disorder, unspecified: Secondary | ICD-10-CM | POA: Insufficient documentation

## 2015-11-02 DIAGNOSIS — R1030 Lower abdominal pain, unspecified: Secondary | ICD-10-CM

## 2015-11-02 DIAGNOSIS — Z8739 Personal history of other diseases of the musculoskeletal system and connective tissue: Secondary | ICD-10-CM | POA: Diagnosis not present

## 2015-11-02 DIAGNOSIS — E119 Type 2 diabetes mellitus without complications: Secondary | ICD-10-CM | POA: Diagnosis not present

## 2015-11-02 DIAGNOSIS — Z8546 Personal history of malignant neoplasm of prostate: Secondary | ICD-10-CM | POA: Diagnosis not present

## 2015-11-02 DIAGNOSIS — R109 Unspecified abdominal pain: Secondary | ICD-10-CM | POA: Diagnosis present

## 2015-11-02 DIAGNOSIS — R1032 Left lower quadrant pain: Secondary | ICD-10-CM | POA: Insufficient documentation

## 2015-11-02 LAB — COMPREHENSIVE METABOLIC PANEL
ALBUMIN: 3.7 g/dL (ref 3.5–5.0)
ALT: 23 U/L (ref 17–63)
ANION GAP: 9 (ref 5–15)
AST: 30 U/L (ref 15–41)
Alkaline Phosphatase: 43 U/L (ref 38–126)
BILIRUBIN TOTAL: 0.4 mg/dL (ref 0.3–1.2)
BUN: 14 mg/dL (ref 6–20)
CHLORIDE: 108 mmol/L (ref 101–111)
CO2: 24 mmol/L (ref 22–32)
Calcium: 9 mg/dL (ref 8.9–10.3)
Creatinine, Ser: 0.71 mg/dL (ref 0.61–1.24)
GFR calc Af Amer: >60 mL/min (ref >60)
GFR calc non Af Amer: >60 mL/min (ref >60)
GLUCOSE: 123 mg/dL — AB (ref 65–99)
POTASSIUM: 4.3 mmol/L (ref 3.5–5.1)
SODIUM: 141 mmol/L (ref 135–145)
TOTAL PROTEIN: 6.6 g/dL (ref 6.5–8.1)

## 2015-11-02 LAB — URINALYSIS, ROUTINE W REFLEX MICROSCOPIC
Bilirubin Urine: NEGATIVE
Glucose, UA: NEGATIVE mg/dL
Hgb urine dipstick: NEGATIVE
KETONES UR: NEGATIVE mg/dL
LEUKOCYTES UA: NEGATIVE
NITRITE: NEGATIVE
PH: 6.5 (ref 5.0–8.0)
Protein, ur: NEGATIVE mg/dL
Specific Gravity, Urine: 1.018 (ref 1.005–1.030)

## 2015-11-02 LAB — CBC WITH DIFFERENTIAL/PLATELET
BASOS ABS: 0 10*3/uL (ref 0.0–0.1)
BASOS PCT: 1 %
EOS ABS: 0.1 10*3/uL (ref 0.0–0.7)
EOS PCT: 2 %
HCT: 41.4 % (ref 39.0–52.0)
Hemoglobin: 14 g/dL (ref 13.0–17.0)
Lymphocytes Relative: 17 %
Lymphs Abs: 1 10*3/uL (ref 0.7–4.0)
MCH: 33.3 pg (ref 26.0–34.0)
MCHC: 33.8 g/dL (ref 30.0–36.0)
MCV: 98.6 fL (ref 78.0–100.0)
MONO ABS: 0.5 10*3/uL (ref 0.1–1.0)
MONOS PCT: 9 %
Neutro Abs: 4 10*3/uL (ref 1.7–7.7)
Neutrophils Relative %: 71 %
PLATELETS: 267 10*3/uL (ref 150–400)
RBC: 4.2 MIL/uL — ABNORMAL LOW (ref 4.22–5.81)
RDW: 14 % (ref 11.5–15.5)
WBC: 5.6 10*3/uL (ref 4.0–10.5)

## 2015-11-02 LAB — LIPASE, BLOOD: Lipase: 25 U/L (ref 11–51)

## 2015-11-02 MED ORDER — SODIUM CHLORIDE 0.9 % IV SOLN
INTRAVENOUS | Status: DC
  Administered 2015-11-02: 19:00:00 via INTRAVENOUS

## 2015-11-02 MED ORDER — IOHEXOL 300 MG/ML  SOLN
100.0000 mL | Freq: Once | INTRAMUSCULAR | Status: AC | PRN
  Administered 2015-11-02: 100 mL via INTRAVENOUS

## 2015-11-02 MED ORDER — IOHEXOL 300 MG/ML  SOLN
25.0000 mL | Freq: Once | INTRAMUSCULAR | Status: AC | PRN
  Administered 2015-11-02: 25 mL via ORAL

## 2015-11-02 NOTE — Discharge Instructions (Signed)

## 2015-11-02 NOTE — ED Notes (Signed)
[place] given to patient.

## 2015-11-02 NOTE — ED Notes (Signed)
Called [place], left message on machine for her to give me a call.

## 2015-11-02 NOTE — ED Notes (Signed)
Pt BIB EMS after being picked up at the hair braiding salon where his daughter was getting her hair done. Daughter stated that patient was c/o inguinal pain where he has had hernia pain before. Patient is not c/o anything at this time. Ambulatory. Hx of dementia. Alert.

## 2015-11-02 NOTE — ED Provider Notes (Signed)
CSN: [id number]     Arrival date & time 11/02/15  1826 History   First MD Initiated Contact with Patient 11/02/15 1903     Chief Complaint  Patient presents with  . Hernia     (Consider location/radiation/quality/duration/timing/severity/associated sxs/prior Treatment) HPI Comments: Pt here with acute onset of abd pain w/ emesis x 1  Hx limited by pts dementia D/c from hospital yesterday after admit for ams No reported bilious emesis or diarrhea abd pain poorly localized Ems called and pt not given any tx pta  The history is provided by the patient and the EMS personnel. The history is limited by the condition of the patient.    Past Medical History  Diagnosis Date  . Arthritis   . Cancer [facility])     prostate   . Stroke ([place])   . 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 Nasal congestion   . Hearing loss   . Difficulty urinating   . Weakness   . Dementia   . Diabetes mellitus without complication [facility])    Past Surgical History  Procedure Laterality Date  . Hernia repair  2012   Family History  Problem Relation Age of Onset  . Heart attack Father   . Diabetes Brother   . Hypertension Brother    Social History  Substance Use Topics  . Smoking status: Current Every Day Smoker  . Smokeless tobacco: None  . Alcohol Use: Yes     Comment: daily    Review of Systems  Unable to perform ROS: Dementia      Allergies  Review of patient's allergies indicates no known allergies.  Home Medications   Prior to Admission medications   Medication Sig Start Date End Date Taking? Authorizing Provider  Capsicum Oleoresin (ARTHRITIS PAIN RELIEF RUB EX) Apply 1 application topically daily as needed (arthritis pain.).   Yes Historical Provider, MD  carbamide peroxide (DEBROX) 6.5 % otic solution Place 5 drops into both ears daily as needed (earwax removal.).   Yes Historical Provider, MD  donepezil (ARICEPT) 10 MG tablet Take 1 tablet (10 mg total) by mouth at  bedtime. 10/29/15  Yes Lambert Lions, PA-C  isosorbide mononitrate (IMDUR) 30 MG 24 hr tablet Take 30 mg by mouth daily. 10/27/15  Yes Historical Provider, MD  metoprolol succinate (TOPROL XL) 25 MG 24 hr tablet Take 1 tablet (25 mg total) by mouth daily. 07/26/15  Yes Robbie Lis, MD  Multiple Vitamin (MULTIVITAMIN WITH MINERALS) TABS tablet Take 1 tablet by mouth daily.   Yes Historical Provider, MD  zolpidem (AMBIEN) 5 MG tablet Take 1 tablet (5 mg total) by mouth at bedtime as needed for sleep. 10/29/15  Yes [place] Lions, PA-C  sertraline (ZOLOFT) 25 MG tablet Take 1 tablet (25 mg total) by mouth daily. 06/01/15   Debby Freiberg, MD   BP 168/95 mmHg  Pulse 90  Temp(Src) 98 F (36.7 C) (Oral)  Resp 20  SpO2 100% Physical Exam  Constitutional: He is oriented to person, place, and time. He appears well-developed and well-nourished.  Non-toxic appearance. No distress.  HENT:  Head: Normocephalic and atraumatic.  Eyes: Conjunctivae, EOM and lids are normal. Pupils are equal, round, and reactive to light.  Neck: Normal range of motion. Neck supple. No tracheal deviation present. No thyroid mass present.  Cardiovascular: Normal rate, regular rhythm and normal heart sounds.  Exam reveals no gallop.   No murmur heard. Pulmonary/Chest: Effort normal and  breath sounds normal. No stridor. No respiratory distress. He has no decreased breath sounds. He has no wheezes. He has no rhonchi. He has no rales.  Abdominal: Soft. Normal appearance and bowel sounds are normal. He exhibits no distension. There is tenderness in the right lower quadrant and left lower quadrant. There is no rigidity, no rebound, no guarding and no CVA tenderness. Hernia confirmed negative in the right inguinal area and confirmed negative in the left inguinal area.  Genitourinary:     Musculoskeletal: Normal range of motion. He exhibits no edema or tenderness.  Neurological: He is alert and oriented to person, place,  and time. He has normal strength. No cranial nerve deficit or sensory deficit. GCS eye subscore is 4. GCS verbal subscore is 5. GCS motor subscore is 6.  Skin: Skin is warm and dry. No abrasion and no rash noted.  Psychiatric: His mood appears anxious. His speech is rapid and/or pressured.  Nursing note and vitals reviewed.   ED Course  Procedures (including critical care time) Labs Review Labs Reviewed  CBC WITH DIFFERENTIAL/PLATELET  COMPREHENSIVE METABOLIC PANEL  LIPASE, BLOOD  URINALYSIS, ROUTINE W REFLEX MICROSCOPIC (NOT AT [facility])    Imaging Review No results found. I have personally reviewed and evaluated these images and lab results as part of my medical decision-making.   EKG Interpretation None      MDM   Final diagnoses:  None    Patient's labs and abdominal CT without acute findings. He is stable for discharge    Lacretia Leigh, MD 11/02/15 2248

## 2015-11-02 NOTE — ED Notes (Signed)
Pt's niece [name] called and advised that pt was up for discharge; [name] states "you need to treat him, he keeps saying he is sick"; RN advised that patient has been evaluated and does not criteria for admission and that he is being discharged and that she needs to come pick him up or the patient can be sent back via [place]; [name] states "I am not home"; RN advised that pt needs to be picked up; [name] states "I will try to find someone to come get him"

## 2015-11-03 MED ORDER — ISOSORBIDE MONONITRATE ER 30 MG PO TB24
30.0000 mg | ORAL_TABLET | Freq: Every day | ORAL | Status: DC
  Administered 2015-11-03 – 2015-11-04 (×2): 30 mg via ORAL
  Filled 2015-11-03 (×3): qty 1

## 2015-11-03 MED ORDER — ZOLPIDEM TARTRATE 5 MG PO TABS: 5.0000 mg | ORAL_TABLET | Freq: Every evening | ORAL | Status: DC | PRN

## 2015-11-03 MED ORDER — METOPROLOL SUCCINATE ER 25 MG PO TB24
25.0000 mg | ORAL_TABLET | Freq: Every day | ORAL | Status: DC
  Administered 2015-11-03 – 2015-11-04 (×2): 25 mg via ORAL
  Filled 2015-11-03 (×3): qty 1

## 2015-11-03 MED ORDER — DONEPEZIL HCL 10 MG PO TABS
10.0000 mg | ORAL_TABLET | Freq: Every day | ORAL | Status: DC
  Administered 2015-11-03: 10 mg via ORAL
  Filled 2015-11-03 (×2): qty 1

## 2015-11-03 MED ORDER — SERTRALINE HCL 50 MG PO TABS
25.0000 mg | ORAL_TABLET | Freq: Every day | ORAL | Status: DC
  Administered 2015-11-03 – 2015-11-04 (×2): 25 mg via ORAL
  Filled 2015-11-03 (×2): qty 1

## 2015-11-03 NOTE — Progress Notes (Addendum)
Pt was brought back to the TCU from the main ED. Pt is pleasant and cooperative.He presently sitting up in the bed drinking coffee and watching TV. Pts belongings were locked in locker number 31. Walker remains at the bedside. Pt is for social work as unable to reach family to pick 4:30p Physical therapy with the pt. 6:20pm Per [place], pt may be discharged this pm and may have home health follow up with him on Monday. Brittanny made aware pt told a staff member earlier that the pt. lived in a trailer w/o water and heat.He told the writer that the trailer got closed up .  Per [place] she has been in contact with the pts caregiver. Pt ate 100% of his dinner,. Charge nurse phoned that pt may leave via [place] but requested the exact niece's address be verified. Phoned [place] and awaiting a call back (7:25pm) Report to [place].

## 2015-11-03 NOTE — NC FL2 (Signed)
[place] LEVEL OF CARE SCREENING TOOL     IDENTIFICATION  Patient Name: [name] Rice Birthdate: 01-22-1925 Sex: male Admission Date (Current Location): 11/02/2015  [facility] and [place] Number:  Herbalist and Address:  [facility],  [address] [place], [place]      Provider Number: [id number]  Attending Physician Name and Address:  Lacretia Leigh, MD  Relative Name and Phone Number:       Current Level of Care: Hospital Recommended Level of Care: [place] Prior Approval Number:    Date Approved/Denied:   PASRR Number:    Discharge Plan: SNF    Current Diagnoses: Patient Active Problem List   Diagnosis Date Noted  . Altered mental status 10/29/2015  . Colitis 10/18/2015  . Lower abdominal pain 10/18/2015  . H/O prostate cancer 10/18/2015  . Bladder wall thickening 10/18/2015  . Abdominal pain 10/18/2015  . Generalized abdominal pain   . Bilateral shoulder pain   . Chest pain 07/25/2015  . Right inguinal hernia 07/10/2011  . CLOSED FRACTURE OF TWO RIBS 01/15/2011  . GAIT DISTURBANCE 12/06/2010  . DYSPNEA 12/06/2010  . Alcohol abuse 09/28/2010  . CHEST PAIN 09/28/2010  . Alzheimer's disease 01/26/2009  . ANKLE PAIN, LEFT 01/26/2009  . Insomnia 01/26/2009  . PROBLEMS WITH SMELL AND TASTE 01/26/2009  . ADENOCARCINOMA, PROSTATE, GLEASON GRADE 7 10/29/2003    Orientation RESPIRATION BLADDER Height & Weight    Self  Normal (Room Air) Continent (CSW does not see any information regarding patient being incompetent)   97 lbs.  BEHAVIORAL SYMPTOMS/MOOD NEUROLOGICAL BOWEL NUTRITION STATUS      Continent (CSW does not see any information regarding patient being incompetent) Diet (Low sodium heart healthy)  AMBULATORY STATUS COMMUNICATION OF NEEDS Skin     Verbally Normal                       Personal Care Assistance Level of Assistance   (CSW does not see any information regarding patient's personal  care)           Functional Limitations Info    Sight Info: Adequate        SPECIAL CARE FACTORS FREQUENCY                       Contractures      Additional Factors Info  Code Status, Allergies (Prior) Code Status Info:  (Prior) Allergies Info:  (No Known Allergies)           Current Medications (11/03/2015):  This is the current hospital active medication list Current Facility-Administered Medications  Medication Dose Route Frequency Provider Last Rate Last Dose  . 0.9 %  sodium chloride infusion   Intravenous Continuous Lacretia Leigh, MD   Stopped at 11/02/15 2330  . donepezil (ARICEPT) tablet 10 mg  10 mg Oral QHS Lacretia Leigh, MD      . isosorbide mononitrate (IMDUR) 24 hr tablet 30 mg  30 mg Oral Daily Lacretia Leigh, MD      . metoprolol succinate (TOPROL-XL) 24 hr tablet 25 mg  25 mg Oral Daily Lacretia Leigh, MD      . sertraline (ZOLOFT) tablet 25 mg  25 mg Oral Daily Lacretia Leigh, MD      . zolpidem [facility]) tablet 5 mg  5 mg Oral QHS PRN Lacretia Leigh, MD       Current Outpatient Prescriptions  Medication Sig Dispense Refill  .  Capsicum Oleoresin (ARTHRITIS PAIN RELIEF RUB EX) Apply 1 application topically daily as needed (arthritis pain.).    Marland Kitchen carbamide peroxide (DEBROX) 6.5 % otic solution Place 5 drops into both ears daily as needed (earwax removal.).    Marland Kitchen donepezil (ARICEPT) 10 MG tablet Take 1 tablet (10 mg total) by mouth at bedtime. 14 tablet 0  . isosorbide mononitrate (IMDUR) 30 MG 24 hr tablet Take 30 mg by mouth daily.    . metoprolol succinate (TOPROL XL) 25 MG 24 hr tablet Take 1 tablet (25 mg total) by mouth daily. 30 tablet 0  . Multiple Vitamin (MULTIVITAMIN WITH MINERALS) TABS tablet Take 1 tablet by mouth daily.    Marland Kitchen zolpidem (AMBIEN) 5 MG tablet Take 1 tablet (5 mg total) by mouth at bedtime as needed for sleep. 14 tablet 0  . sertraline (ZOLOFT) 25 MG tablet Take 1 tablet (25 mg total) by mouth daily. 30 tablet 0     Discharge  Medications: Please see discharge summary for a list of discharge medications.  Relevant Imaging Results:  Relevant Lab Results:   Additional Information    Guadelupe Sabin, LCSW

## 2015-11-03 NOTE — Progress Notes (Signed)
CSW and CSW assistant reached out to [facility] and and informed her that patient is in need of SNF bed. CSW informed liaison that [facility] has been completed and has been sent to their facility. Liaison states that nursing director/Larry will review patient in order for future placement.  CSW sent FL2 to patient. PASRR has also been completed and is under review.  CSW called niece to inform her that [facility] is reviewing patient and that the process for a facility search has been started. However, he will not be admitted to their facility tonight. CSW inquired if she will be able to take the patient back tonight due to facility search being started. Niece understands and agrees that she will take the patient back tonight. However, she states that she is at [facility] receiving care for her leg at this time. She states that she will call CSW or Publishing copy once she has been released and when it is okay to send the patient back home tonight. CSW also informed niece that the patient has been referred to other facilities as well.   Niece made it clear that she does not have transportation and that she will not be able to pick the patient up. She states that she would like the patient to be transported home by [facility]. CSW made nurse aware that niece has stated patient can come back home tonight and will need PTAR.  CSW spoke with DSS APS worker Reola Mosher who confirms that niece is the legal guardian for patient. Patient has a hx of dementia.   CSW staffed case with [place] Surveyor, quantity. CSW made EDP aware.  CSW spoke with patient at bedside. Patient states that he would like to go to a SNF.   Willette Brace [id number] ED CSW 11/03/2015 6:44 PM

## 2015-11-03 NOTE — Progress Notes (Signed)
ED CM spoke with Santiago Glad of [facility] that state pt had been d/c from [facility] on 10/23/15 but did not see a reason why pt could be received per referral for services  Cm called in new referral for [facility], PT/OT, aide, SW  Cm entered in [facility], PT/OT, aide and SW

## 2015-11-03 NOTE — Progress Notes (Addendum)
ED Cm consulted by ED SW x 2 at 1608 requesting home health for pt  ED Cm consuted by [facility] PT staff Wanted to review pt issues

## 2015-11-03 NOTE — Evaluation (Signed)
Physical Therapy Evaluation 1x   Patient Details Name: [name] Rice MRN: [id number] DOB: Jul 09, 1925 Today's Date: 11/03/2015   History of Present Illness  82 yo admit with complaints of abdominal pain, however stated during our session he is feeling much better with no pain during our session.   Clinical Impression  Pt stated he is feeling much better, tolerated session with PT very well. Able to ambulate with AD and with no LOB. See notes. Could benefit from further rehab  f/u at home health or nursing home with more safety and higher level cognitive skills. Unclear of pt's DC place , home , or home situation during this session. See notes below on what pt reported during session.    Follow Up Recommendations Supervision - Intermittent;Home health PT (may need to consider nursing home as well if pt does not have intermittent supervision or appropriate place to DC to. )    Equipment Recommendations       Recommendations for Other Services       Precautions / Restrictions        Mobility  Bed Mobility Overal bed mobility: Independent                Transfers Overall transfer level: Independent Equipment used: Standard walker (then used no AD) Transfers: Sit to/from Stand Sit to Stand: Independent         General transfer comment: moves quickly with no LOB during this session   Ambulation/Gait Ambulation/Gait assistance: Modified independent (Device/Increase time) Ambulation Distance (Feet): 100 Feet Assistive device: Rolling walker (2 wheeled) (used RW and then used no AD during second walk. ) Gait Pattern/deviations: WFL(Within Functional Limits);Step-through pattern     General Gait Details: pt did well with RW and without RW, stated he had been walkign in halls with RW earlier. Without RW pt also did well completing a turn, abrupt stop and start, backward stepping, side stepping and marching with no LOB. Also picked item off floor with no difficulties.   Stairs             Wheelchair Mobility    Modified Rankin (Stroke Patients Only)       Balance Overall balance assessment: Modified Independent Sitting-balance support: No upper extremity supported;Feet supported       Standing balance support: No upper extremity supported;During functional activity Standing balance-Leahy Scale: Good Standing balance comment: pt able to have a fast paced gait with no LOB and see balance tests up in gait comment section                              Pertinent Vitals/Pain Pain Assessment: No/denies pain    Home Living Family/patient expects to be discharged to:: Private residence Living Arrangements: Alone Available Help at Discharge: Personal care attendant (pt states she is there a few days a wee, but seems not to have been around much lately. He state she has a bad temper. ) Type of Home: Mobile home Home Access: Stairs to enter Entrance Stairs-Rails: Right Entrance Stairs-Number of Steps: 3 Home Layout: One level Home Equipment: Walker - 2 wheels;Cane - single point;Grab bars - toilet      Prior Function Level of Independence: Independent         Comments: Pt states he has one of these" referrignto the RW" but he doesn't have to use it , only sometimes. He states he does his own cooking, and dressing, I never understood what his  care giver helped him with expect possibly safety factors and bringing the groveries to him as well. Difficult to understand if she lives with him or not. He stated at teh end of our conversation taht his trailer is in bad condition and may not have heat or running water, and that she is trying to sell it now. Not sure how much is accurate or not. ??     Hand Dominance        Extremity/Trunk Assessment               Lower Extremity Assessment: Overall WFL for tasks assessed (pt showed strenght WNL on B LES and BUES and good coordination and ROM as well. Pt continued to state, " I am good, I  feel good now". )         Communication   Communication: No difficulties (very pleasant and upbeat. )  Cognition Arousal/Alertness: Awake/alert Behavior During Therapy: WFL for tasks assessed/performed Overall Cognitive Status: Within Functional Limits for tasks assessed                 General Comments: without knowing accuracy of his PLOF or situation , difficult to assess his memory and cognitive safety at this time.     General Comments      Exercises        Assessment/Plan    PT Assessment All further PT needs can be met in the next venue of care  PT Diagnosis Generalized weakness   PT Problem List Decreased activity tolerance;Decreased safety awareness  PT Treatment Interventions     PT Goals (Current goals can be found in the Care Plan section)      Frequency     Barriers to discharge Decreased caregiver support difficult to know true or accurate story of whether he had a caregiver before 24 hrs a day, or he reported at different times during the week, but he would like to get a new one or work on going to a nursing home. Worry due to pt stated his trailer may not have heat or running water.     Co-evaluation               End of Session Equipment Utilized During Treatment: Gait belt Activity Tolerance: Patient tolerated treatment well Patient left: in bed;with call bell/phone within reach Nurse Communication: Mobility status    Functional Assessment Tool Used: clinical judgement Functional Limitation: Mobility: Walking and moving around Mobility: Walking and Moving Around Current Status [phone]): 0 percent impaired, limited or restricted Mobility: Walking and Moving Around Goal Status [phone]): 0 percent impaired, limited or restricted Mobility: Walking and Moving Around Discharge Status [phone]): 0 percent impaired, limited or restricted    Time:  -      Charges:   PT Evaluation $PT Eval Low Complexity: 1 Procedure     PT G Codes:   PT  G-Codes **NOT FOR INPATIENT CLASS** Functional Assessment Tool Used: clinical judgement Functional Limitation: Mobility: Walking and moving around Mobility: Walking and Moving Around Current Status (Q7341): 0 percent impaired, limited or restricted Mobility: Walking and Moving Around Goal Status (P3790): 0 percent impaired, limited or restricted Mobility: Walking and Moving Around Discharge Status [phone]): 0 percent impaired, limited or restricted    Kanishk Stroebel 11/03/2015, 5:13 PM Clide Dales, PT Pager: [phone] 11/03/2015

## 2015-11-03 NOTE — ED Notes (Signed)
[employer] drove to pt's listed address where pt's niece is supposed to reside. The person who answered the door denied knowing the pt and denied being [place]. A social work consult will be made

## 2015-11-03 NOTE — ED Notes (Signed)
Contacted communications to go to the address:  [address] [place], [place]

## 2015-11-03 NOTE — Progress Notes (Addendum)
38:30a- CSW staffed with nurse. CSW attempted to speak with patient. Patient was sleep at the time.  9:30a- CSW staffed case with Social Work Asst. Mudlogger. CSW then called to speak with [name], niece, [phone]) [phone]. CSW could barely hear the niece speaking in the phone, as she sounded very drowsy. She stated she was currently in [place] asked niece if patient resided with her and she stated patient lives with her for right now. Niece asked if CSW could call back later.   CSW staffed with Social Work Asst. Mudlogger. CSW will attempt a call back to niece later today.  Genice Rouge [id number] ED CSW 11/03/2015 9:13 AM

## 2015-11-03 NOTE — ED Notes (Signed)
Pt taken to TCU.  Pt has a walker with him that is new and has tags in place.  ? If patients.  Placed sticker on walker.

## 2015-11-03 NOTE — NC FL2 (Signed)
[place] LEVEL OF CARE SCREENING TOOL     IDENTIFICATION  Patient Name: [name] Rice Birthdate: Mar 08, 1925 Sex: male Admission Date (Current Location): 11/02/2015  [facility] and [place] Number:  Herbalist and Address:  [facility],  [address], [place]      Provider Number: [id number]  Attending Physician Name and Address:  Lacretia Leigh, MD  Relative Name and Phone Number:       Current Level of Care: Hospital Recommended Level of Care: [place] Prior Approval Number:    Date Approved/Denied:   PASRR Number:    Discharge Plan: SNF    Current Diagnoses: Patient Active Problem List   Diagnosis Date Noted  . Altered mental status 10/29/2015  . Colitis 10/18/2015  . Lower abdominal pain 10/18/2015  . H/O prostate cancer 10/18/2015  . Bladder wall thickening 10/18/2015  . Abdominal pain 10/18/2015  . Generalized abdominal pain   . Bilateral shoulder pain   . Chest pain 07/25/2015  . Right inguinal hernia 07/10/2011  . CLOSED FRACTURE OF TWO RIBS 01/15/2011  . GAIT DISTURBANCE 12/06/2010  . DYSPNEA 12/06/2010  . Alcohol abuse 09/28/2010  . CHEST PAIN 09/28/2010  . Alzheimer's disease 01/26/2009  . ANKLE PAIN, LEFT 01/26/2009  . Insomnia 01/26/2009  . PROBLEMS WITH SMELL AND TASTE 01/26/2009  . ADENOCARCINOMA, PROSTATE, GLEASON GRADE 7 10/29/2003    Orientation RESPIRATION BLADDER Height & Weight    Self  Normal (Room Air) Continent (CSW does not see any information regarding patient being incompetent)   97 lbs.  BEHAVIORAL SYMPTOMS/MOOD NEUROLOGICAL BOWEL NUTRITION STATUS      Continent (CSW does not see any information regarding patient being incompetent) Diet (Low sodium heart healthy)  AMBULATORY STATUS COMMUNICATION OF NEEDS Skin     Verbally Normal                       Personal Care Assistance Level of Assistance   (CSW does not see any information regarding patient's personal  care)           Functional Limitations Info    Sight Info: Adequate        SPECIAL CARE FACTORS FREQUENCY                       Contractures      Additional Factors Info  Code Status, Allergies (Prior) Code Status Info:  (Prior) Allergies Info:  (No Known Allergies)           Current Medications (11/03/2015):  This is the current hospital active medication list Current Facility-Administered Medications  Medication Dose Route Frequency Provider Last Rate Last Dose  . 0.9 %  sodium chloride infusion   Intravenous Continuous Lacretia Leigh, MD   Stopped at 11/02/15 2330  . donepezil (ARICEPT) tablet 10 mg  10 mg Oral QHS Lacretia Leigh, MD      . isosorbide mononitrate (IMDUR) 24 hr tablet 30 mg  30 mg Oral Daily Lacretia Leigh, MD      . metoprolol succinate (TOPROL-XL) 24 hr tablet 25 mg  25 mg Oral Daily Lacretia Leigh, MD      . sertraline (ZOLOFT) tablet 25 mg  25 mg Oral Daily Lacretia Leigh, MD      . zolpidem [facility]) tablet 5 mg  5 mg Oral QHS PRN Lacretia Leigh, MD       Current Outpatient Prescriptions  Medication Sig Dispense Refill  .  Capsicum Oleoresin (ARTHRITIS PAIN RELIEF RUB EX) Apply 1 application topically daily as needed (arthritis pain.).    Marland Kitchen carbamide peroxide (DEBROX) 6.5 % otic solution Place 5 drops into both ears daily as needed (earwax removal.).    Marland Kitchen donepezil (ARICEPT) 10 MG tablet Take 1 tablet (10 mg total) by mouth at bedtime. 14 tablet 0  . isosorbide mononitrate (IMDUR) 30 MG 24 hr tablet Take 30 mg by mouth daily.    . metoprolol succinate (TOPROL XL) 25 MG 24 hr tablet Take 1 tablet (25 mg total) by mouth daily. 30 tablet 0  . Multiple Vitamin (MULTIVITAMIN WITH MINERALS) TABS tablet Take 1 tablet by mouth daily.    Marland Kitchen zolpidem (AMBIEN) 5 MG tablet Take 1 tablet (5 mg total) by mouth at bedtime as needed for sleep. 14 tablet 0  . sertraline (ZOLOFT) 25 MG tablet Take 1 tablet (25 mg total) by mouth daily. 30 tablet 0     Discharge  Medications: Please see discharge summary for a list of discharge medications.  Relevant Imaging Results:  Relevant Lab Results:   Additional Information    Alexsa Flaum R, LCSW

## 2015-11-03 NOTE — Progress Notes (Signed)
CSW attempted to follow up with the status of niece/legal guardian for patient. However, she did not answer the phone. CSW left a message asking for a call back.  Willette Brace [id number] ED CSW 11/03/2015 10:03 PM

## 2015-11-03 NOTE — Progress Notes (Addendum)
[facility] ED CM consulted by [facility] ED nursing administrator, Santiago Glad Reviewed chart notes and This Cm recall working with this pt and his caregiver previously Plus pt was seen by other care management staff during recent admission on 10/30/15 Cm spoke with ED SW, [place] to share collateral information  If pt's primary caregiver is in hospital [facility] and not able to care for pt and he has no other caregiver he does not meet home health criteria  [facility] ED nursing administrator is aware

## 2015-11-03 NOTE — Progress Notes (Signed)
On January 4, Niece statesd that the address listed in the chart is not correct. She states that their address is [address] In [place]. Niece states that she would eventually like the patient to be placed in a facility.  Niece is not home at this time. Niece states that she will call once she gets home from hospital. She states that patient will need PTAR for transportation.   CSW gave niece/legal guardian contact information to reach her when she gets home. Niece was informed and given Publishing copy number to call when she got home in order to send the patient back home tonight.  Willette Brace [id number] ED CSW 11/03/2015 7:38 PM

## 2015-11-03 NOTE — ED Provider Notes (Signed)
SW is trying to get patient into a skilled facility. Requests PT evaluation  Sherwood Gambler, MD 11/03/15 [phone]

## 2015-11-03 NOTE — Progress Notes (Signed)
12:19p- CSW spoke with [name] Rice, niece [phone]. She stated she was still hospitalized at [facility] with edema issues with her leg. She stated as soon as she returns home, patient can come back to her home. She stated patient does not have any other family he can live with at this time. She stated she is patients POA.   CSW asked if their was an open Adult Protective Services case open. She stated she did not know if a APS case was open, however, she stated she has provided information to an individual named Ms. Market from [place].    12:25p- CSW staffed case with Social Work Asst. Mudlogger to inform him of information from the niece. CSW was informed to begin working on SNF Placement for patient and call the niece to inform.   CSW spoke with patient at bedside. Patient stated he was doing good. CSW asked patient if he would want to go to a SNF. Patient stated he wants to go to a nursing home and he does not want to go back with [place]. Patient stated he has been with [name] Rice for fourteen years. Patient stated he can complete his ADL's: bathing, dressing, and feeding. Patient stated he does not have anything to do with the rest of his family. Patient stated he has no family to reside with at this time.   CSW spoke with [name] Rice, niece to inform her of CSW working on SNF placement. CSW informed niece financially, someone would need to sign patient in a nursing home for them to receive payments. Niece asked if patient could be placed in a facility in [place].   No further questions for CSW at this time.  2:40p- CSW attempted to speak with doctor for PT consult. No one answered at the time.   Genice Rouge [id number] ED CSW 11/03/2015 2:54 PM

## 2015-11-03 NOTE — Progress Notes (Signed)
Home health assessment Pt state "Ma'am, I don't have a place to stay." "I stayed with [name] but now I can't no longer" "we don't get along" " I want to go to a nursing home"  "I walk" " Don't have a driver's license to drive"  Pt with Rolling walker at bedside Pt states he has no other support system other than [name]. States he has no other family, friends or church members to help him.   Left pt contact number for [facility] and a list of PDN  Discussed pt with EDP [place]

## 2015-11-03 NOTE — Progress Notes (Signed)
CSW completed FL2 on patient.   CSW received telephone call from [name], niece, [phone]. She stated she was being discharged from the hospital. CSW asked if she could pick patient up or if someone could pick up patient. She stated she would be unable to pick up patient because of her health issues. She stated she was informed by Ms. Beverely Low from [facility] [employer].  Ms. Beverely Low, APS called and stated the niece has called her several times today inquiring about placement for patient.   CSW staffed with with Social Work Asst. Mudlogger. He stated to follow-up with Nurse CM regarding possible home health services. Nurse CM stated that patient is not eligible for home health at this time.   Genice Rouge [id number] ED CSW 11/03/2015 4:14 PM

## 2015-11-04 NOTE — ED Notes (Signed)
He is on a hospital bed with skin care setting on and maintained.

## 2015-11-04 NOTE — ED Notes (Signed)
His P.O.A., niece [name] phones and states she is home and ready to receive him; if we could call PTAR for transport.

## 2015-11-04 NOTE — Clinical Social Work Note (Signed)
CSW received call from ED regarding pt discharge.  Per chart notes.  Niece is the POA and wants pt sent home via ptar today and will follow up with SNF Monday.  CSW called and spoke with pt's niece who stated that pt did not go home yesterday because she was in the hospital.  CSW met with pt who stated that he wanted to go home with niece until placement in SNF on Monday  CSW prepared ptar form, provided to RN and will provide information to MD  .Dede Query, [place] Worker - Weekend Coverage cell #: [phone]

## 2015-11-04 NOTE — ED Notes (Signed)
PTAR phones his niece to confirm she will be there upon their arrival; and she does indeed confirm this after which pt. Leaves our facility without incident.

## 2015-11-04 NOTE — ED Provider Notes (Signed)
Patient seen by social worker today and she spoke with the patient's daughter. They'll continue placement of the patient to a snf from home. The daughters called with taking the patient back home.  Lacretia Leigh, MD 11/04/15 [phone]

## 2015-11-04 NOTE — ED Notes (Signed)
Niece/caregiver never called to have pt come home.  Pt still pending discharge.  Pt denies any needs or concerns.  Pt resting quietly.

## 2015-11-04 NOTE — ED Notes (Signed)
He remains in no distress and seems excited and pleased to be going home.

## 2015-11-04 NOTE — Progress Notes (Addendum)
Pt is pleasant this am. He was easily awakened and stated his name . Pt is not oriented to place or time. He has good eye contact and stated he slept good last pm. Pt does not appear in any distress.Pt stated,'I do not want to wash up today. I just want to rest and sleep. " Pt does have a steady gait and ambulates to the restroom but at times does use a walker. Report to Tim.

## 2015-11-04 NOTE — ED Notes (Signed)
I have spoken with Dr. Zenia Resides, who has [place] enlist the assist of our social worker to ensure that it is correct and safe to send pt. Home with his niece.  Social worker notified at this time and she states she will come down to speak with Dr. Zenia Resides shortly.

## 2015-11-06 ENCOUNTER — Encounter (HOSPITAL_COMMUNITY): Payer: Self-pay

## 2015-11-06 ENCOUNTER — Emergency Department (HOSPITAL_COMMUNITY)
Admission: EM | Admit: 2015-11-06 | Discharge: 2015-11-07 | Disposition: A | Discharge status: Home / Self Care | Payer: Medicare HMO | Attending: Emergency Medicine | Admitting: Emergency Medicine

## 2015-11-06 ENCOUNTER — Emergency Department (HOSPITAL_COMMUNITY): Payer: Medicare HMO

## 2015-11-06 DIAGNOSIS — Z79899 Other long term (current) drug therapy: Secondary | ICD-10-CM | POA: Diagnosis not present

## 2015-11-06 DIAGNOSIS — R109 Unspecified abdominal pain: Secondary | ICD-10-CM | POA: Diagnosis not present

## 2015-11-06 DIAGNOSIS — Z8546 Personal history of malignant neoplasm of prostate: Secondary | ICD-10-CM | POA: Diagnosis not present

## 2015-11-06 DIAGNOSIS — R1030 Lower abdominal pain, unspecified: Secondary | ICD-10-CM | POA: Diagnosis present

## 2015-11-06 DIAGNOSIS — Z8739 Personal history of other diseases of the musculoskeletal system and connective tissue: Secondary | ICD-10-CM | POA: Diagnosis not present

## 2015-11-06 DIAGNOSIS — F17200 Nicotine dependence, unspecified, uncomplicated: Secondary | ICD-10-CM | POA: Insufficient documentation

## 2015-11-06 DIAGNOSIS — F0390 Unspecified dementia without behavioral disturbance: Secondary | ICD-10-CM | POA: Diagnosis not present

## 2015-11-06 DIAGNOSIS — H9190 Unspecified hearing loss, unspecified ear: Secondary | ICD-10-CM | POA: Diagnosis not present

## 2015-11-06 DIAGNOSIS — E119 Type 2 diabetes mellitus without complications: Secondary | ICD-10-CM | POA: Insufficient documentation

## 2015-11-06 DIAGNOSIS — Z8673 Personal history of transient ischemic attack (TIA), and cerebral infarction without residual deficits: Secondary | ICD-10-CM | POA: Insufficient documentation

## 2015-11-06 LAB — CBC WITH DIFFERENTIAL/PLATELET
BASOS ABS: 0.1 10*3/uL (ref 0.0–0.1)
Basophils Relative: 1 %
EOS ABS: 0.2 10*3/uL (ref 0.0–0.7)
EOS PCT: 3 %
HCT: 41.7 % (ref 39.0–52.0)
Hemoglobin: 14.2 g/dL (ref 13.0–17.0)
LYMPHS ABS: 1.4 10*3/uL (ref 0.7–4.0)
LYMPHS PCT: 23 %
MCH: 33.6 pg (ref 26.0–34.0)
MCHC: 34.1 g/dL (ref 30.0–36.0)
MCV: 98.6 fL (ref 78.0–100.0)
MONO ABS: 0.6 10*3/uL (ref 0.1–1.0)
Monocytes Relative: 9 %
Neutro Abs: 4.1 10*3/uL (ref 1.7–7.7)
Neutrophils Relative %: 64 %
PLATELETS: 270 10*3/uL (ref 150–400)
RBC: 4.23 MIL/uL (ref 4.22–5.81)
RDW: 14.1 % (ref 11.5–15.5)
WBC: 6.4 10*3/uL (ref 4.0–10.5)

## 2015-11-06 LAB — LIPASE, BLOOD: LIPASE: 33 U/L (ref 11–51)

## 2015-11-06 LAB — COMPREHENSIVE METABOLIC PANEL
ALT: 19 U/L (ref 17–63)
AST: 24 U/L (ref 15–41)
Albumin: 3.6 g/dL (ref 3.5–5.0)
Alkaline Phosphatase: 46 U/L (ref 38–126)
Anion gap: 7 (ref 5–15)
BUN: 15 mg/dL (ref 6–20)
CHLORIDE: 110 mmol/L (ref 101–111)
CO2: 28 mmol/L (ref 22–32)
Calcium: 9.1 mg/dL (ref 8.9–10.3)
Creatinine, Ser: 0.84 mg/dL (ref 0.61–1.24)
Glucose, Bld: 72 mg/dL (ref 65–99)
POTASSIUM: 3.8 mmol/L (ref 3.5–5.1)
SODIUM: 145 mmol/L (ref 135–145)
Total Bilirubin: 0.3 mg/dL (ref 0.3–1.2)
Total Protein: 6.6 g/dL (ref 6.5–8.1)

## 2015-11-06 LAB — I-STAT CG4 LACTIC ACID, ED: LACTIC ACID, VENOUS: 2.04 mmol/L — AB (ref 0.5–2.0)

## 2015-11-06 MED ORDER — ONDANSETRON HCL 4 MG/2ML IJ SOLN: 4.0000 mg | Freq: Once | INTRAMUSCULAR | Status: DC | PRN

## 2015-11-06 MED ORDER — FENTANYL CITRATE (PF) 100 MCG/2ML IJ SOLN: 50.0000 ug | Freq: Once | INTRAMUSCULAR | Status: DC

## 2015-11-06 MED ORDER — FENTANYL CITRATE (PF) 100 MCG/2ML IJ SOLN: 25.0000 ug | Freq: Once | INTRAMUSCULAR | Status: DC | PRN

## 2015-11-06 MED ORDER — SODIUM CHLORIDE 0.9 % IV BOLUS (SEPSIS)
500.0000 mL | Freq: Once | INTRAVENOUS | Status: AC
  Administered 2015-11-06: 500 mL via INTRAVENOUS

## 2015-11-06 MED ORDER — ONDANSETRON HCL 4 MG/2ML IJ SOLN: 4.0000 mg | Freq: Once | INTRAMUSCULAR | Status: DC

## 2015-11-06 NOTE — ED Notes (Signed)
Pt complains of inguinal hernia pain since 3pm that radiates to epigastric pain, he states he's vomited twice this afternoon

## 2015-11-06 NOTE — ED Provider Notes (Signed)
CSN: [id number]     Arrival date & time 11/06/15  1914 History   First MD Initiated Contact with Patient 11/06/15 2132     Chief Complaint  Patient presents with  . Abdominal Pain     (Consider location/radiation/quality/duration/timing/severity/associated sxs/prior Treatment) HPI   [name] Rice is a(n) 82 y.o. male who presents with cc of abdominal pain. He has a past medical history of diabetes, cancer, arthritis, difficulty urinating, history of alcohol abuse. He is complaining of lower abdominal pain. Patient is hard of hearing with dementia and is a difficult historian. The patient complains of lower abdominal pain for the past 2 days that comes and goes. He denies difficulty urinating, but does endorse frequent urination. He states that he has intermittent constipation. He states that his last bowel movement was 2 days ago. It has been seen multiple times for similar complaints and requesting placement in a nursing home.  Past Medical History  Diagnosis Date  . Arthritis   . Cancer [facility])     prostate   . Stroke ([place])   . 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 Nasal congestion   . Hearing loss   . Difficulty urinating   . Weakness   . Dementia   . Diabetes mellitus without complication [facility])    Past Surgical History  Procedure Laterality Date  . Hernia repair  2012   Family History  Problem Relation Age of Onset  . Heart attack Father   . Diabetes Brother   . Hypertension Brother    Social History  Substance Use Topics  . Smoking status: Current Every Day Smoker  . Smokeless tobacco: None  . Alcohol Use: Yes     Comment: daily    Review of Systems    Allergies  Review of patient's allergies indicates no known allergies.  Home Medications   Prior to Admission medications   Medication Sig Start Date End Date Taking? Authorizing Provider  Capsicum Oleoresin (ARTHRITIS PAIN RELIEF RUB EX) Apply 1 application topically daily as needed (arthritis  pain.).   Yes Historical Provider, MD  donepezil (ARICEPT) 10 MG tablet Take 1 tablet (10 mg total) by mouth at bedtime. 10/29/15  Yes [place] Lions, PA-C  isosorbide mononitrate (IMDUR) 30 MG 24 hr tablet Take 30 mg by mouth daily. 10/27/15  Yes Historical Provider, MD  metoprolol succinate (TOPROL XL) 25 MG 24 hr tablet Take 1 tablet (25 mg total) by mouth daily. 07/26/15  Yes Robbie Lis, MD  Multiple Vitamin (MULTIVITAMIN WITH MINERALS) TABS tablet Take 1 tablet by mouth daily.   Yes Historical Provider, MD  sertraline (ZOLOFT) 25 MG tablet Take 1 tablet (25 mg total) by mouth daily. 06/01/15  Yes Debby Freiberg, MD  zolpidem (AMBIEN) 5 MG tablet Take 1 tablet (5 mg total) by mouth at bedtime as needed for sleep. 10/29/15  Yes [place] Lions, PA-C  carbamide peroxide (DEBROX) 6.5 % otic solution Place 5 drops into both ears daily as needed (earwax removal.).    Historical Provider, MD   BP 170/78 mmHg  Pulse 72  Temp(Src) 97.5 F (36.4 C) (Oral)  Resp 14  SpO2 98% Physical Exam  Constitutional: He appears well-developed and well-nourished. No distress.  HENT:  Head: Normocephalic and atraumatic.  Eyes: Conjunctivae are normal. No scleral icterus.  Misshapen L Pupil  Neck: Normal range of motion. Neck supple.  Cardiovascular: Normal rate, regular rhythm and normal heart sounds.   Pulmonary/Chest: Effort  normal and breath sounds normal. No respiratory distress.  Abdominal: Soft. There is tenderness.    Musculoskeletal: He exhibits no edema.  Neurological: He is alert.  Skin: Skin is warm and dry. He is not diaphoretic.  Psychiatric: His behavior is normal.  Nursing note and vitals reviewed.   ED Course  Procedures (including critical care time) Labs Review Labs Reviewed  I-STAT CG4 LACTIC ACID, ED - Abnormal; Notable for the following:    Lactic Acid, Venous 2.04 (*)    All other components within normal limits  CBC WITH DIFFERENTIAL/PLATELET  COMPREHENSIVE  METABOLIC PANEL  LIPASE, BLOOD  URINALYSIS, ROUTINE W REFLEX MICROSCOPIC (NOT AT [facility])    Imaging Review No results found. I have personally reviewed and evaluated these images and lab results as part of my medical decision-making.   EKG Interpretation None      MDM   Final diagnoses:  Belly pain    10:04 PM BP 170/78 mmHg  Pulse 72  Temp(Src) 97.5 F (36.4 C) (Oral)  Resp 14  SpO2 98% {atient with htn, well appearing. Slightly elevated lactate. Will suspect mild dehydration. CBC without abnormality. Will give IV fluids. He has no abdominal pain at this time.  Patient's workup negative. He is asking to eat a sandwich Patient seen in shared visit with attending physician. He appears safe for discharge.  1:05 AM I spoke with the Patient's niece who says that he needs to stay here and go to a nursing home. She states that we need to "quit lying to him." the patient's "niece" is his primary care taker. I asked her if she was refusing to allow him home and she states" I am not refusing, but if asks to call 911 I am sending him right back to you, and you can call APS if you want to because they've already been out here."  I asked the caretaker if she could help him get a PCP who would be able to help him get into a nursing home and she stated "No because my leg is broken." I asked if she could call and establish and appointment and she again stated:" No, I can't." I asked the patient if he could take a cab [facility] and he states:"I don't  Have any money because she takes all my money every month.I can't afford a cab."  Patient has no place to return tonight. He will need a social work consult in the morning.    Margarita Mail, PA-C 11/07/15 0117  Quintella Reichert, MD 11/08/15 [phone]

## 2015-11-06 NOTE — Discharge Instructions (Signed)
Abdominal (belly) pain can be caused by many things. Your caregiver performed an examination and possibly ordered blood/urine tests and imaging (CT scan, x-rays, ultrasound). Many cases can be observed and treated at home after initial evaluation in the emergency department. Even though you are being discharged home, abdominal pain can be unpredictable. Therefore, you need a repeated exam if your pain does not resolve, returns, or worsens. Most patients with abdominal pain don't have to be admitted to the hospital or have surgery, but serious problems like appendicitis and gallbladder attacks can start out as nonspecific pain. Many abdominal conditions cannot be diagnosed in one visit, so follow-up evaluations are very important. SEEK IMMEDIATE MEDICAL ATTENTION IF: The pain does not go away or becomes severe.  A temperature above 101 develops.  Repeated vomiting occurs (multiple episodes).  The pain becomes localized to portions of the abdomen. The right side could possibly be appendicitis. In an adult, the left lower portion of the abdomen could be colitis or diverticulitis.  Blood is being passed in stools or vomit (bright red or black tarry stools).  Return also if you develop chest pain, difficulty breathing, dizziness or fainting, or become confused, poorly responsive, or inconsolable (young children).  Indigestion Indigestion is a feeling of pain, discomfort, burning, or fullness in the upper part of your abdomen. It can come and go. It may occur frequently or rarely. Indigestion tends to occur while you are eating or right after you have finished eating. It may be worse at night and while bending over or lying down. HOME CARE INSTRUCTIONS Take these actions to decrease your pain or discomfort and to help avoid complications. Diet  Follow a diet as recommended by your health care provider. This may involve avoiding foods and drinks such as:  Coffee and tea (with or without  caffeine).  Drinks that contain alcohol.  Energy drinks and sports drinks.  Carbonated drinks or sodas.  Chocolate and cocoa.  Peppermint and mint flavorings.  Garlic and onions.  Horseradish.  Spicy and acidic foods, including peppers, chili powder, curry powder, vinegar, hot sauces, and barbecue sauce.  Citrus fruit juices and citrus fruits, such as oranges, lemons, and limes.  Tomato-based foods, such as red sauce, chili, salsa, and pizza with red sauce.  Fried and fatty foods, such as donuts, french fries, potato chips, and high-fat dressings.  High-fat meats, such as hot dogs and fatty cuts of red and white meats, such as rib eye steak, sausage, ham, and bacon.  High-fat dairy items, such as whole milk, butter, and cream cheese.  Eat small, frequent meals instead of large meals.  Avoid drinking large amounts of liquid with your meals.  Avoid eating meals during the 2-3 hours before bedtime.  Avoid lying down right after you eat.  Do not exercise right after you eat. General Instructions  Pay attention to any changes in your symptoms.  Take over-the-counter and prescription medicines only as told by your health care provider. Do not take aspirin, ibuprofen, or other NSAIDs unless your health care provider told you to do so.  Do not use any tobacco products, including cigarettes, chewing tobacco, and e-cigarettes. If you need help quitting, ask your health care provider.  Wear loose-fitting clothing. Do not wear anything tight around your waist that causes pressure on your abdomen.  Raise (elevate) the head of your bed about 6 inches (15 cm).  Try to reduce your stress, such as with yoga or meditation. If you need help reducing stress, ask  your health care provider.  If you are overweight, reduce your weight to an amount that is healthy for you. Ask your health care provider for guidance about a safe weight loss goal.  Keep all follow-up visits as told by your  health care provider. This is important. SEEK MEDICAL CARE IF:  You have new symptoms.  You have unexplained weight loss.  You have difficulty swallowing, or it hurts to swallow.  Your symptoms do not improve with treatment.  Your symptoms last for more than two days.  You have a fever.  You vomit. SEEK IMMEDIATE MEDICAL CARE IF:  You have pain in your arms, neck, jaw, teeth, or back.  You feel sweaty, dizzy, or light-headed.  You faint.  You have chest pain or shortness of breath.  You cannot stop vomiting, or you vomit blood.  Your stool is bloody or black.  You have severe pain in your abdomen.   This information is not intended to replace advice given to you by your health care provider. Make sure you discuss any questions you have with your health care provider.   Document Released: 11/21/2004 Document Revised: 07/05/2015 Document Reviewed: 02/08/2015 Elsevier Interactive Patient Education 2016 Elsevier Inc. Abdominal Pain, Adult Many things can cause abdominal pain. Usually, abdominal pain is not caused by a disease and will improve without treatment. It can often be observed and treated at home. Your health care provider will do a physical exam and possibly order blood tests and X-rays to help determine the seriousness of your pain. However, in many cases, more time must pass before a clear cause of the pain can be found. Before that point, your health care provider may not know if you need more testing or further treatment. HOME CARE INSTRUCTIONS Monitor your abdominal pain for any changes. The following actions may help to alleviate any discomfort you are experiencing:  Only take over-the-counter or prescription medicines as directed by your health care provider.  Do not take laxatives unless directed to do so by your health care provider.  Try a clear liquid diet (broth, tea, or water) as directed by your health care provider. Slowly move to a bland diet as  tolerated. SEEK MEDICAL CARE IF:  You have unexplained abdominal pain.  You have abdominal pain associated with nausea or diarrhea.  You have pain when you urinate or have a bowel movement.  You experience abdominal pain that wakes you in the night.  You have abdominal pain that is worsened or improved by eating food.  You have abdominal pain that is worsened with eating fatty foods.  You have a fever. SEEK IMMEDIATE MEDICAL CARE IF:  Your pain does not go away within 2 hours.  You keep throwing up (vomiting).  Your pain is felt only in portions of the abdomen, such as the right side or the left lower portion of the abdomen.  You pass bloody or black tarry stools. MAKE SURE YOU:  Understand these instructions.  Will watch your condition.  Will get help right away if you are not doing well or get worse.   This information is not intended to replace advice given to you by your health care provider. Make sure you discuss any questions you have with your health care provider.   Document Released: 07/24/2005 Document Revised: 07/05/2015 Document Reviewed: 06/23/2013 Elsevier Interactive Patient Education [employer].

## 2015-11-07 ENCOUNTER — Non-Acute Institutional Stay (SKILLED_NURSING_FACILITY): Payer: Medicare HMO | Admitting: Adult Health

## 2015-11-07 DIAGNOSIS — F0280 Dementia in other diseases classified elsewhere without behavioral disturbance: Secondary | ICD-10-CM

## 2015-11-07 DIAGNOSIS — R1030 Lower abdominal pain, unspecified: Secondary | ICD-10-CM

## 2015-11-07 DIAGNOSIS — M25512 Pain in left shoulder: Secondary | ICD-10-CM | POA: Diagnosis not present

## 2015-11-07 DIAGNOSIS — G4700 Insomnia, unspecified: Secondary | ICD-10-CM

## 2015-11-07 DIAGNOSIS — I10 Essential (primary) hypertension: Secondary | ICD-10-CM | POA: Diagnosis not present

## 2015-11-07 DIAGNOSIS — K4090 Unilateral inguinal hernia, without obstruction or gangrene, not specified as recurrent: Secondary | ICD-10-CM | POA: Diagnosis not present

## 2015-11-07 DIAGNOSIS — G309 Alzheimer's disease, unspecified: Secondary | ICD-10-CM | POA: Diagnosis not present

## 2015-11-07 DIAGNOSIS — M25511 Pain in right shoulder: Secondary | ICD-10-CM

## 2015-11-07 DIAGNOSIS — C61 Malignant neoplasm of prostate: Secondary | ICD-10-CM | POA: Diagnosis not present

## 2015-11-07 LAB — URINALYSIS, ROUTINE W REFLEX MICROSCOPIC
Bilirubin Urine: NEGATIVE
Glucose, UA: 100 mg/dL — AB
Hgb urine dipstick: NEGATIVE
Ketones, ur: NEGATIVE mg/dL
LEUKOCYTES UA: NEGATIVE
Nitrite: NEGATIVE
PH: 6.5 (ref 5.0–8.0)
Protein, ur: NEGATIVE mg/dL
Specific Gravity, Urine: 1.031 — ABNORMAL HIGH (ref 1.005–1.030)

## 2015-11-07 MED ORDER — DONEPEZIL HCL 10 MG PO TABS: 10.0000 mg | ORAL_TABLET | Freq: Every day | ORAL | Status: DC

## 2015-11-07 MED ORDER — THERA VITAL M PO TABS: 1.0000 | ORAL_TABLET | Freq: Every day | ORAL | Status: DC

## 2015-11-07 MED ORDER — SERTRALINE HCL 50 MG PO TABS: 25.0000 mg | ORAL_TABLET | Freq: Every day | ORAL | Status: DC

## 2015-11-07 MED ORDER — ZOLPIDEM TARTRATE 5 MG PO TABS: 5.0000 mg | ORAL_TABLET | Freq: Every evening | ORAL | Status: DC | PRN

## 2015-11-07 MED ORDER — METOPROLOL SUCCINATE ER 25 MG PO TB24: 25.0000 mg | ORAL_TABLET | Freq: Every day | ORAL | Status: DC

## 2015-11-07 MED ORDER — ISOSORBIDE MONONITRATE ER 30 MG PO TB24: 30.0000 mg | ORAL_TABLET | Freq: Every day | ORAL | Status: DC

## 2015-11-07 NOTE — ED Notes (Signed)
PT NOW STATES HE WANTS TO GO. WITNESSED BY STACEY RN. SOCIAL WORK NOTIFIED

## 2015-11-07 NOTE — ED Provider Notes (Signed)
120 patient is alert and offers no complaints ambulates with his 4 pronged cane without difficulty no distress Results for orders placed or performed during the hospital encounter of 11/06/15  CBC with Differential  Result Value Ref Range   WBC 6.4 4.0 - 10.5 K/uL   RBC 4.23 4.22 - 5.81 MIL/uL   Hemoglobin 14.2 13.0 - 17.0 g/dL   HCT 41.7 39.0 - 52.0 %   MCV 98.6 78.0 - 100.0 fL   MCH 33.6 26.0 - 34.0 pg   MCHC 34.1 30.0 - 36.0 g/dL   RDW 14.1 11.5 - 15.5 %   Platelets 270 150 - 400 K/uL   Neutrophils Relative % 64 %   Neutro Abs 4.1 1.7 - 7.7 K/uL   Lymphocytes Relative 23 %   Lymphs Abs 1.4 0.7 - 4.0 K/uL   Monocytes Relative 9 %   Monocytes Absolute 0.6 0.1 - 1.0 K/uL   Eosinophils Relative 3 %   Eosinophils Absolute 0.2 0.0 - 0.7 K/uL   Basophils Relative 1 %   Basophils Absolute 0.1 0.0 - 0.1 K/uL  Comprehensive metabolic panel  Result Value Ref Range   Sodium 145 135 - 145 mmol/L   Potassium 3.8 3.5 - 5.1 mmol/L   Chloride 110 101 - 111 mmol/L   CO2 28 22 - 32 mmol/L   Glucose, Bld 72 65 - 99 mg/dL   BUN 15 6 - 20 mg/dL   Creatinine, Ser 0.84 0.61 - 1.24 mg/dL   Calcium 9.1 8.9 - 10.3 mg/dL   Total Protein 6.6 6.5 - 8.1 g/dL   Albumin 3.6 3.5 - 5.0 g/dL   AST 24 15 - 41 U/L   ALT 19 17 - 63 U/L   Alkaline Phosphatase 46 38 - 126 U/L   Total Bilirubin 0.3 0.3 - 1.2 mg/dL   GFR calc non Af Amer >60 >60 mL/min   GFR calc Af Amer >60 >60 mL/min   Anion gap 7 5 - 15  Lipase, blood  Result Value Ref Range   Lipase 33 11 - 51 U/L  Urinalysis, Routine w reflex microscopic (not at [facility])  Result Value Ref Range   Color, Urine YELLOW YELLOW   APPearance CLEAR CLEAR   Specific Gravity, Urine 1.031 (H) 1.005 - 1.030   pH 6.5 5.0 - 8.0   Glucose, UA 100 (A) NEGATIVE mg/dL   Hgb urine dipstick NEGATIVE NEGATIVE   Bilirubin Urine NEGATIVE NEGATIVE   Ketones, ur NEGATIVE NEGATIVE mg/dL   Protein, ur NEGATIVE NEGATIVE mg/dL   Nitrite NEGATIVE NEGATIVE   Leukocytes, UA  NEGATIVE NEGATIVE  I-Stat CG4 Lactic Acid, ED  Result Value Ref Range   Lactic Acid, Venous 2.04 (HH) 0.5 - 2.0 mmol/L   Comment NOTIFIED PHYSICIAN    Dg Chest 2 View  10/29/2015  CLINICAL DATA:  Altered mental status. EXAM: CHEST  2 VIEW COMPARISON:  07/25/2015, 08/04/2013 and 07/15/2013 FINDINGS: Heart size and pulmonary vascularity are normal. Calcification in the thoracic aorta. Bilateral apical pleural thickening, right more than left. Large hiatal hernia.  Old thoracic compression fractures. IMPRESSION: No acute abnormality. Large hiatal hernia. Old compression fractures. Electronically Signed   By: Lorriane Shire M.D.   On: 10/29/2015 16:57   US Pelvis Limited  10/18/2015  CLINICAL DATA:  Lower abdominal pain.  Question inguinal hernia. EXAM: LIMITED ULTRASOUND OF PELVIS TECHNIQUE: Limited transabdominal ultrasound examination of the pelvis was performed. COMPARISON:  CT abdomen pelvis 10/18/2015. FINDINGS: No inguinal hernia. No solid or cystic mass.  Incidental imaging of the left epididymis shows a 1.5 cm cyst or spermatocele. IMPRESSION: No evidence of an inguinal hernia. Electronically Signed   By: Lorin Picket M.D.   On: 10/18/2015 08:08   Ct Abdomen Pelvis W Contrast  11/02/2015  CLINICAL DATA:  Abdominal pain.  Inguinal pain. EXAM: CT ABDOMEN AND PELVIS WITH CONTRAST TECHNIQUE: Multidetector CT imaging of the abdomen and pelvis was performed using the standard protocol following bolus administration of intravenous contrast. CONTRAST:  28mL OMNIPAQUE IOHEXOL 300 MG/ML SOLN, 139mL OMNIPAQUE IOHEXOL 300 MG/ML SOLN COMPARISON:  CT 2 weeks prior 10/18/2015 FINDINGS: Lower chest: Mild dependent atelectasis bilaterally. Moderate-sized hiatal hernia, similar allowing for differences in technique. Liver: Cyst in the left lobe is unchanged from prior exam. Lobulated vascular lesion in the periphery of the right lobe is is also unchanged. No new lesion. Hepatobiliary: Gallbladder physiologically  distended, no calcified stone. No biliary dilatation. Pancreas: No ductal dilatation or inflammation. Spleen: Normal in size.  Punctate granuloma. Adrenal glands: No nodule. Kidneys: Symmetric renal enhancement. No hydronephrosis. Multiple renal cysts including parapelvic cysts in the left kidney are unchanged. Punctate nonobstructing nephrolithiasis in the right kidney is unchanged. Stomach/Bowel: Stomach physiologically distended. There are no dilated or thickened small bowel loops. Small volume of stool throughout the colon. No definite colonic wall thickening, the previous questioned colonic wall thickening in the rectosigmoid region is not seen. Diverticulosis without diverticulitis. The appendix is normal. Vascular/Lymphatic: No retroperitoneal adenopathy. Abdominal aorta is normal in caliber. Advanced atherosclerosis without aneurysm. Reproductive: Heterogeneous enlarged prostate gland. Bladder: Physiologically distended with diffuse wall thickening and multi focal bladder diverticula. Other: No free air, free fluid, or intra-abdominal fluid collection. Fat within the left inguinal canal without bowel containing hernia. Musculoskeletal: There are no acute or suspicious osseous abnormalities. Stable degenerative change in the spine. Bilateral L5 pars defects with listhesis unchanged. The compression deformity of T12 unchanged. IMPRESSION: 1. No acute abnormality in the abdomen/pelvis. 2. Resolution of previous apparent wall thickening involving the colon. 3. Diffuse bladder wall thickening with bladder diverticula consistent with chronic outlet obstruction. Additional chronic findings are stable. Electronically Signed   By: Jeb Levering M.D.   On: 11/02/2015 22:21   Ct Abdomen Pelvis W Contrast  10/18/2015  CLINICAL DATA:  82 year old male with abdominal pain and episodes of nausea vomiting and constipation. EXAM: CT ABDOMEN AND PELVIS WITH CONTRAST TECHNIQUE: Multidetector CT imaging of the abdomen  and pelvis was performed using the standard protocol following bolus administration of intravenous contrast. CONTRAST:  21mL OMNIPAQUE IOHEXOL 300 MG/ML  SOLN COMPARISON:  CT dated 06/01/2015 FINDINGS: Bibasilar subsegmental linear atelectasis/ scarring. The visualized lung bases are otherwise clear. There is coronary vascular calcification. No intra-abdominal free air or free fluid. Stable appearing left hepatic cystic lesion. Stable appearing lobulated vascular lesion in the periphery of the right lobe of the liver most compatible with a portal venous shunting. The gallbladder, pancreas, spleen, and the adrenal glands appear unremarkable. Stable appearing left renal hypodense lesions, likely cysts. Small left renal parapelvic cysts noted. There is a 3 mm nonobstructing right renal interpolar calculus. No hydronephrosis on either side. The visualized ureters appear unremarkable. There is diffuse thickening and trabecular appearance of the bladder wall compatible with chronic bladder outlet obstruction. The prostate gland is enlarged with median lobe hypertrophy. There is a large hiatal hernia with gastroesophageal reflux. Oral contrast opacifies the stomach and multiple loops of small bowel. There is no evidence of bowel obstruction or inflammation. There is apparent diffuse thickening of  the wall of the colon predominantly involving the rectosigmoid which may be related to underdistention. Colitis is not excluded. Clinical correlation is recommended. There is sigmoid diverticulosis without active inflammation. Normal appendix. Advanced aortoiliac atherosclerotic disease. The origins of the celiac axis, SMA, IMA as well as the origins of the renal arteries appear patent. No portal venous gas identified. There is no adenopathy. The abdominal wall soft tissues appear unremarkable. There is osteopenia with extensive degenerative changes of the spine. Old healed left posterior rib fractures. No acute fracture  identified. There are bilateral L5 pars defects with grade 1 L5-S1 anterolisthesis. There is T12 compression fractures similar to prior study. IMPRESSION: Under distention versus colitis. Correlation with clinical exam and stool cultures recommended. No bowel obstruction. Normal appendix. Moderate-sized hiatal hernia. Diffuse bladder wall thickening sequela of chronic bladder outlet obstruction. Correlation with urinalysis recommended to exclude superimposed UTI. Other findings similar to the prior CT. Electronically Signed   By: Anner Crete M.D.   On: 10/18/2015 01:02   Dg Abd Acute W/chest  11/06/2015  CLINICAL DATA:  Abdominal pain. Bowel movement 2 days ago. Patient reports inguinal pain. EXAM: DG ABDOMEN ACUTE W/ 1V CHEST COMPARISON:  CT 4 days prior 11/02/2015 FINDINGS: Heart size is normal, mild tortuosity and atherosclerosis of the thoracic aorta. Retrocardiac hiatal hernia again seen. The lungs are clear. There is no free intra-abdominal air. No dilated bowel loops to suggest obstruction. Small volume of stool throughout the colon. Small calculus again projects over the right renal bed. No acute osseous abnormalities are seen. There is scoliotic curvature of the spine. IMPRESSION: No bowel obstruction or free air. Retrocardiac hiatal hernia. Right nephrolithiasis again seen. Electronically Signed   By: Jeb Levering M.D.   On: 11/06/2015 22:56   Plan d/c to skilled nursing faciltty  Orlie Dakin, MD 11/07/15 1329

## 2015-11-07 NOTE — ED Notes (Signed)
SOCIAL WORK [employer]MY AREA DIRECTOR HERE TO SEE PT AND SPEAK WITH OUR SOCIAL WORKER

## 2015-11-07 NOTE — NC FL2 (Signed)
[place] LEVEL OF CARE SCREENING TOOL     IDENTIFICATION  Patient Name: [name] Rice Birthdate: January 16, 1925 Sex: male Admission Date (Current Location): 11/06/2015  [facility] and [place] Number:  Herbalist and Address:  [facility],  [place] [address] [place]      Provider Number: [id number]  Attending Physician Name and Address:  Provider Default, MD  Relative Name and Phone Number:       Current Level of Care: Hospital Recommended Level of Care: [place] Prior Approval Number:    Date Approved/Denied:   PASRR Number:    Discharge Plan: SNF    Current Diagnoses: Patient Active Problem List   Diagnosis Date Noted  . Altered mental status 10/29/2015  . Colitis 10/18/2015  . Lower abdominal pain 10/18/2015  . H/O prostate cancer 10/18/2015  . Bladder wall thickening 10/18/2015  . Abdominal pain 10/18/2015  . Generalized abdominal pain   . Bilateral shoulder pain   . Chest pain 07/25/2015  . Right inguinal hernia 07/10/2011  . CLOSED FRACTURE OF TWO RIBS 01/15/2011  . GAIT DISTURBANCE 12/06/2010  . DYSPNEA 12/06/2010  . Alcohol abuse 09/28/2010  . CHEST PAIN 09/28/2010  . Alzheimer's disease 01/26/2009  . ANKLE PAIN, LEFT 01/26/2009  . Insomnia 01/26/2009  . PROBLEMS WITH SMELL AND TASTE 01/26/2009  . ADENOCARCINOMA, PROSTATE, GLEASON GRADE 7 10/29/2003    Orientation RESPIRATION BLADDER Height & Weight    Self  Normal Continent   97 lbs.  BEHAVIORAL SYMPTOMS/MOOD NEUROLOGICAL BOWEL NUTRITION STATUS      Continent Diet (Diet regular room)  AMBULATORY STATUS COMMUNICATION OF NEEDS Skin     Verbally Normal                       Personal Care Assistance Level of Assistance   (Needs assistance with ADL's)           Functional Limitations Info  Sight Sight Info: Adequate        SPECIAL CARE FACTORS FREQUENCY                       Contractures      Additional Factors Info   Code Status, Allergies Code Status Info: Prior (Prior) Allergies Info:  (No Known Allergies)           Current Medications (11/07/2015):  This is the current hospital active medication list Current Facility-Administered Medications  Medication Dose Route Frequency Provider Last Rate Last Dose  . fentaNYL (SUBLIMAZE) injection 25 mcg  25 mcg Intravenous Once PRN Margarita Mail, PA-C      . ondansetron (ZOFRAN) injection 4 mg  4 mg Intravenous Once PRN Margarita Mail, PA-C       Current Outpatient Prescriptions  Medication Sig Dispense Refill  . Capsicum Oleoresin (ARTHRITIS PAIN RELIEF RUB EX) Apply 1 application topically daily as needed (arthritis pain.).    Marland Kitchen donepezil (ARICEPT) 10 MG tablet Take 1 tablet (10 mg total) by mouth at bedtime. 14 tablet 0  . isosorbide mononitrate (IMDUR) 30 MG 24 hr tablet Take 30 mg by mouth daily.    . metoprolol succinate (TOPROL XL) 25 MG 24 hr tablet Take 1 tablet (25 mg total) by mouth daily. 30 tablet 0  . Multiple Vitamin (MULTIVITAMIN WITH MINERALS) TABS tablet Take 1 tablet by mouth daily.    . sertraline (ZOLOFT) 25 MG tablet Take 1 tablet (25 mg total) by mouth daily. 30 tablet  0  . zolpidem (AMBIEN) 5 MG tablet Take 1 tablet (5 mg total) by mouth at bedtime as needed for sleep. 14 tablet 0  . carbamide peroxide (DEBROX) 6.5 % otic solution Place 5 drops into both ears daily as needed (earwax removal.).       Discharge Medications: Please see discharge summary for a list of discharge medications.  Relevant Imaging Results:  Relevant Lab Results:   Additional Information    Guadelupe Sabin, LCSW

## 2015-11-07 NOTE — Progress Notes (Signed)
CSW staffed with nurse. CSW met with patient at bedside. Patient stated he was brought to [facility] due to his stomach hurting. Patient stated his niece hurt her leg and she cannot care for him. Patient was making some statements that CSW could not understand. Patient had no questions for CSW at this time.  CSW will staff with Supervisor regarding plan for patient.  Genice Rouge [phone] ED CSW 11/07/2015 9:39 AM

## 2015-11-07 NOTE — ED Notes (Signed)
SOCIAL WORK IN TO SEE THIS PT. PT STATES HE WILL NOT GO TO FACILITY.

## 2015-11-07 NOTE — ED Notes (Signed)
SOCIAL WORK PRESENT TO SEE PT

## 2015-11-07 NOTE — Progress Notes (Signed)
CSW staffed with Asst. Social Work Mudlogger. CSW met with [name] Rice, Liason, [name] to provide and gather information for possible placement. CSW provided Liason with FL2, discharge summary, and scripts from the EDP. After obtaining information, Liason verified with her agency and stated patient is allowed to be admitted to the agency. Liason stated she spoke with [name] Rice, niece to inform her that patient had been accepted.   CSW spoke with [name] Rice, niece in the presence of the patient to inform her that patient was stating patient states now he did not want to go to a nursing facility. Niece stated patient was just agitated because patient was being lied too by hospital staff. CSW informed niece that patient has received assistance with SNF. Niece stated to just send patient to the SNF. CSW informed niece if patient states he does not want to to go the SNF, then he would be discharged home. Niece insisted on patient going to the SNF. Niece stated she was unable to care for patient at the time due to her leg. Patient stated he did not want to go.  CSW received telephone call from nurse stating patient changed his mind and he now wanted to go to SNF. CSW spoke with patient and he states he now wants to go to the SNF. Patient was up and dressed at this time.   CSW spoke with EDP to see patient regarding capacity. EDP signed FL2 and spoke with patient to let him know he would be going to a SNF. EDP also provided scripts for patient.   CSW sent an SMS message by pressing #5 and the cell # was repeated for CSW to hear for niece to return the call. CSW called inform the niece that patient would be going to [facility] today. CSW unable to leave a message.   CSW spoke with Asst. Social Work Mudlogger as [name] Rice stated she would need a Letter of Guarantee. Asst. Director stated he would be able to provide the LOG regarding patient. CSW also spoke with [name] Rice  regarding patient having a change of mind wanting to come to their facility.  CSW spoke with Liason and informed her that a LOG would be able to be completed. Liason stated nurse could call [facility] for patient to be transported to SNF.   Niece called back and she was informed that patient will be transported to [facility] on [address] informed niece to ball with any questions.   [name] Rice [phone] ED CSW 11/07/2015 3:52 PM

## 2015-11-07 NOTE — ED Notes (Signed)
Bed: [facility] Expected date:  Expected time:  Means of arrival:  Comments: 

## 2015-11-07 NOTE — ED Notes (Signed)
PT DISCHARGED. INSTRUCTIONS GIVEN. AAOX3. PT IN NO APPARENT DISTRESS OR PAIN. THE OPPORTUNITY TO ASK QUESTIONS WAS PROVIDED. [facility] ARRIVED TO TRANSPORT PT TO [facility].

## 2015-11-07 NOTE — ED Notes (Signed)
Called pt's caretaker, [name], to came get pt from ED because pt was being discharged. RN was told by niece that we needed to place pt in a nursing home and was not coming to get him. Niece is primary caregiver for pt. PA spoke with niece also about situation. Charge nurse made aware.

## 2015-11-07 NOTE — Progress Notes (Signed)
CSW completed FL2 for facilities to review regarding SNF placement.   CSW spoke with Gayla Medicus from [facility]. She stated she will review patient's information.  Genice Rouge [phone] ED CSW 11/07/2015 12:04 PM

## 2015-11-10 LAB — CBC AND DIFFERENTIAL
HEMATOCRIT: 38 % — AB (ref 41–53)
HEMOGLOBIN: 12.9 g/dL — AB (ref 13.5–17.5)
PLATELETS: 272 10*3/uL (ref 150–399)
WBC: 5.5 10*3/mL

## 2015-11-10 LAB — BASIC METABOLIC PANEL
BUN: 16 mg/dL (ref 4–21)
CREATININE: 0.8 mg/dL (ref 0.6–1.3)
Glucose: 69 mg/dL
Potassium: 4.1 mmol/L (ref 3.4–5.3)
Sodium: 140 mmol/L (ref 137–147)

## 2015-11-14 ENCOUNTER — Encounter: Payer: Self-pay | Admitting: Internal Medicine

## 2015-11-14 ENCOUNTER — Non-Acute Institutional Stay (SKILLED_NURSING_FACILITY): Payer: Medicare HMO | Admitting: Internal Medicine

## 2015-11-14 DIAGNOSIS — R5381 Other malaise: Secondary | ICD-10-CM | POA: Diagnosis not present

## 2015-11-14 DIAGNOSIS — G309 Alzheimer's disease, unspecified: Secondary | ICD-10-CM

## 2015-11-14 DIAGNOSIS — R269 Unspecified abnormalities of gait and mobility: Secondary | ICD-10-CM | POA: Diagnosis not present

## 2015-11-14 DIAGNOSIS — K529 Noninfective gastroenteritis and colitis, unspecified: Secondary | ICD-10-CM | POA: Diagnosis not present

## 2015-11-14 DIAGNOSIS — F0280 Dementia in other diseases classified elsewhere without behavioral disturbance: Secondary | ICD-10-CM | POA: Diagnosis not present

## 2015-11-14 DIAGNOSIS — Z8546 Personal history of malignant neoplasm of prostate: Secondary | ICD-10-CM

## 2015-11-14 DIAGNOSIS — F1010 Alcohol abuse, uncomplicated: Secondary | ICD-10-CM | POA: Diagnosis not present

## 2015-11-14 DIAGNOSIS — N3289 Other specified disorders of bladder: Secondary | ICD-10-CM

## 2015-11-14 NOTE — Progress Notes (Signed)
Patient ID: [name] Rice, male   DOB: 08/27/1925, 82 y.o.   MRN: [id number]    HISTORY AND PHYSICAL   DATE: 11/14/15  Location:  [place] of Service: SNF [phone])   Extended Emergency Contact Information Primary Emergency Contact: [place] [facility] [place] Phone: [phone] Relation: Other  Advanced Directive information  FULL CODE  Chief Complaint  Patient presents with  . New Admit To SNF    HPI:  82 yo male seen today as a new admission into SNF following x 2 ED visits and recent hospital stay for altered mental status, Alzheimer's dementia, indeterminate colitis, bladder wall thickening, hx Etoh abuse, HTN, hx CVA, insomnia, hx prostate CA. C diff toxin indeterminate. Na 146-->140 with hypotonic IVF. CXR showed no acute process. Per hospital records, niece requested nursing home placement for pt as she is unable/unwilling to care for him at home. 1/5th ED visit he was seen for hernia. CT abdomen/pelvis neg for acute process. 1/9th ED visit he was seen for abdominal pain and dehydration. He rec'd IVF. He was then sent to SNF for probable long term placement.  He has no c/o. He is a poor historian due to dementia. Hx obtained from chart. No nursing issues. No falls.  Alzheimer's disease - takes aricept 10 mg nightly. Albumin 3.6  Hypertension/chest pain - BP stable on toprol xl 25 mg daily and  imdur 30 mg daily for his chest pain.   Anxiety with depression - mood stable on zoloft 25 mg daily   Insomnia - has ambein 5 mg nightly as needed   hx Prostate cancer - gleason grade 7. Followed by urology  Bilateral shoulder pain - stable on capsaicin 0.025% cream daily as needed for joint pain  right inguinal hernia - noted at ED visit. Not incarcerated or strangulated per recent CT abd/pelvis. He is asymptomatic at this time  Hx Etoh abuse - he still drinks Etoh. Takes MVI daily   Past Medical History  Diagnosis Date  .  Arthritis   . Cancer [facility])     prostate   . Stroke ([place])   . 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 Nasal congestion   . Hearing loss   . Difficulty urinating   . Weakness   . Dementia   . Diabetes mellitus without complication [facility])     Past Surgical History  Procedure Laterality Date  . Hernia repair  2012    No care team member to display  Social History   Social History  . Marital Status: Widowed    Spouse Name: N/A  . Number of Children: N/A  . Years of Education: N/A   Occupational History  . Not on file.   Social History Main Topics  . Smoking status: Current Every Day Smoker  . Smokeless tobacco: Not on file  . Alcohol Use: Yes     Comment: daily  . Drug Use: No  . Sexual Activity: No   Other Topics Concern  . Not on file   Social History Narrative     reports that he has been smoking.  He does not have any smokeless tobacco history on file. He reports that he drinks alcohol. He reports that he does not use illicit drugs.  Family History  Problem Relation Age of Onset  . Heart attack Father   . Diabetes Brother   . Hypertension Brother    Family Status  Relation Status Death Age  . Mother Deceased   . Father Deceased     Immunization History  Administered Date(s) Administered  . Influenza Whole 09/28/2010  . Influenza,inj,Quad PF,36+ Mos 07/16/2013, 07/26/2015    No Known Allergies  Medications: Patient's Medications  New Prescriptions   No medications on file  Previous Medications   CARBAMIDE PEROXIDE (DEBROX) 6.5 % OTIC SOLUTION    Place 5 drops into both ears daily as needed (wax bld up).   DONEPEZIL (ARICEPT) 10 MG TABLET    Take 1 tablet (10 mg total) by mouth at bedtime.   DONEPEZIL (ARICEPT) 10 MG TABLET    Take 1 tablet (10 mg total) by mouth at bedtime.   ISOSORBIDE MONONITRATE (IMDUR) 30 MG 24 HR TABLET    Take 30 mg by mouth daily.   ISOSORBIDE MONONITRATE (IMDUR) 30 MG 24 HR TABLET    Take 1 tablet (30 mg  total) by mouth daily.   METOPROLOL SUCCINATE (TOPROL XL) 25 MG 24 HR TABLET    Take 1 tablet (25 mg total) by mouth daily.   METOPROLOL SUCCINATE (TOPROL-XL) 25 MG 24 HR TABLET    Take 1 tablet (25 mg total) by mouth daily.   MULTIPLE VITAMIN (MULTIVITAMIN WITH MINERALS) TABS TABLET    Take 1 tablet by mouth daily.   MULTIPLE VITAMINS-MINERALS (MULTIVITAMIN) TABLET    Take 1 tablet by mouth daily.   SERTRALINE (ZOLOFT) 25 MG TABLET    Take 1 tablet (25 mg total) by mouth daily.   SERTRALINE (ZOLOFT) 50 MG TABLET    Take 0.5 tablets (25 mg total) by mouth daily.   ZOLPIDEM (AMBIEN) 5 MG TABLET    Take 1 tablet (5 mg total) by mouth at bedtime as needed for sleep.   ZOLPIDEM (AMBIEN) 5 MG TABLET    Take 1 tablet (5 mg total) by mouth at bedtime as needed for sleep.  Modified Medications   No medications on file  Discontinued Medications   No medications on file    Review of Systems  Unable to perform ROS: Dementia    Filed Vitals:   11/14/15 1118  BP: 134/64  Pulse: 60  Temp: 97.8 F (36.6 C)  Weight: 102 lb 12.8 oz (46.63 kg)  SpO2: 98%   Body mass index is 20.08 kg/(m^2).  Physical Exam  Constitutional: He appears well-developed.  Lying in bed in NAD. Frail appearing  HENT:  Mouth/Throat: Oropharynx is clear and moist.  Eyes: Pupils are equal, round, and reactive to light. No scleral icterus.  Neck: Neck supple. Carotid bruit is not present. No thyromegaly present.  Cardiovascular: Normal rate, regular rhythm and intact distal pulses.  Exam reveals no gallop and no friction rub.   Murmur (1/6 SEM) heard. no distal LE swelling. No calf TTP  Pulmonary/Chest: Effort normal and breath sounds normal. He has no wheezes. He has no rales. He exhibits no tenderness.  Abdominal: Soft. Bowel sounds are normal. He exhibits no distension, no abdominal bruit, no pulsatile midline mass and no mass. There is no tenderness. There is no rebound and no guarding.  Lymphadenopathy:    He has  no cervical adenopathy.  Neurological: He is alert.  Skin: Skin is warm and dry. No rash noted.  Psychiatric: He has a normal mood and affect. His behavior is normal.     Labs reviewed: Admission on 11/06/2015, Discharged on 11/07/2015  Component Date Value Ref Range Status  . WBC 11/06/2015 6.4  4.0 - 10.5 K/uL Final  .  RBC 11/06/2015 4.23  4.22 - 5.81 MIL/uL Final  . Hemoglobin 11/06/2015 14.2  13.0 - 17.0 g/dL Final  . HCT 11/06/2015 41.7  39.0 - 52.0 % Final  . MCV 11/06/2015 98.6  78.0 - 100.0 fL Final  . MCH 11/06/2015 33.6  26.0 - 34.0 pg Final  . MCHC 11/06/2015 34.1  30.0 - 36.0 g/dL Final  . RDW 11/06/2015 14.1  11.5 - 15.5 % Final  . Platelets 11/06/2015 270  150 - 400 K/uL Final  . Neutrophils Relative % 11/06/2015 64   Final  . Neutro Abs 11/06/2015 4.1  1.7 - 7.7 K/uL Final  . Lymphocytes Relative 11/06/2015 23   Final  . Lymphs Abs 11/06/2015 1.4  0.7 - 4.0 K/uL Final  . Monocytes Relative 11/06/2015 9   Final  . Monocytes Absolute 11/06/2015 0.6  0.1 - 1.0 K/uL Final  . Eosinophils Relative 11/06/2015 3   Final  . Eosinophils Absolute 11/06/2015 0.2  0.0 - 0.7 K/uL Final  . Basophils Relative 11/06/2015 1   Final  . Basophils Absolute 11/06/2015 0.1  0.0 - 0.1 K/uL Final  . Sodium 11/06/2015 145  135 - 145 mmol/L Final  . Potassium 11/06/2015 3.8  3.5 - 5.1 mmol/L Final  . Chloride 11/06/2015 110  101 - 111 mmol/L Final  . CO2 11/06/2015 28  22 - 32 mmol/L Final  . Glucose, Bld 11/06/2015 72  65 - 99 mg/dL Final  . BUN 11/06/2015 15  6 - 20 mg/dL Final  . Creatinine, Ser 11/06/2015 0.84  0.61 - 1.24 mg/dL Final  . Calcium 11/06/2015 9.1  8.9 - 10.3 mg/dL Final  . Total Protein 11/06/2015 6.6  6.5 - 8.1 g/dL Final  . Albumin 11/06/2015 3.6  3.5 - 5.0 g/dL Final  . AST 11/06/2015 24  15 - 41 U/L Final  . ALT 11/06/2015 19  17 - 63 U/L Final  . Alkaline Phosphatase 11/06/2015 46  38 - 126 U/L Final  . Total Bilirubin 11/06/2015 0.3  0.3 - 1.2 mg/dL Final  . GFR  calc non Af Amer 11/06/2015 >60  >60 mL/min Final  . GFR calc Af Amer 11/06/2015 >60  >60 mL/min Final   Comment: (NOTE) The eGFR has been calculated using the CKD EPI equation. This calculation has not been validated in all clinical situations. eGFR's persistently <60 mL/min signify possible Chronic Kidney Disease.   . Anion gap 11/06/2015 7  5 - 15 Final  . Lipase 11/06/2015 33  11 - 51 U/L Final  . Color, Urine 11/07/2015 YELLOW  YELLOW Final  . APPearance 11/07/2015 CLEAR  CLEAR Final  . Specific Gravity, Urine 11/07/2015 1.031* 1.005 - 1.030 Final  . pH 11/07/2015 6.5  5.0 - 8.0 Final  . Glucose, UA 11/07/2015 100* NEGATIVE mg/dL Final  . Hgb urine dipstick 11/07/2015 NEGATIVE  NEGATIVE Final  . Bilirubin Urine 11/07/2015 NEGATIVE  NEGATIVE Final  . Ketones, ur 11/07/2015 NEGATIVE  NEGATIVE mg/dL Final  . Protein, ur 11/07/2015 NEGATIVE  NEGATIVE mg/dL Final  . Nitrite 11/07/2015 NEGATIVE  NEGATIVE Final  . Leukocytes, UA 11/07/2015 NEGATIVE  NEGATIVE Final   MICROSCOPIC NOT DONE ON URINES WITH NEGATIVE PROTEIN, BLOOD, LEUKOCYTES, NITRITE, OR GLUCOSE <1000 mg/dL.  . Lactic Acid, Venous 11/06/2015 2.04* 0.5 - 2.0 mmol/L Final  . Comment 11/06/2015 NOTIFIED PHYSICIAN   Final  Admission on 11/02/2015, Discharged on 11/04/2015  Component Date Value Ref Range Status  . WBC 11/02/2015 5.6  4.0 - 10.5 K/uL Final  .  RBC 11/02/2015 4.20* 4.22 - 5.81 MIL/uL Final  . Hemoglobin 11/02/2015 14.0  13.0 - 17.0 g/dL Final  . HCT 11/02/2015 41.4  39.0 - 52.0 % Final  . MCV 11/02/2015 98.6  78.0 - 100.0 fL Final  . MCH 11/02/2015 33.3  26.0 - 34.0 pg Final  . MCHC 11/02/2015 33.8  30.0 - 36.0 g/dL Final  . RDW 11/02/2015 14.0  11.5 - 15.5 % Final  . Platelets 11/02/2015 267  150 - 400 K/uL Final  . Neutrophils Relative % 11/02/2015 71   Final  . Neutro Abs 11/02/2015 4.0  1.7 - 7.7 K/uL Final  . Lymphocytes Relative 11/02/2015 17   Final  . Lymphs Abs 11/02/2015 1.0  0.7 - 4.0 K/uL Final  .  Monocytes Relative 11/02/2015 9   Final  . Monocytes Absolute 11/02/2015 0.5  0.1 - 1.0 K/uL Final  . Eosinophils Relative 11/02/2015 2   Final  . Eosinophils Absolute 11/02/2015 0.1  0.0 - 0.7 K/uL Final  . Basophils Relative 11/02/2015 1   Final  . Basophils Absolute 11/02/2015 0.0  0.0 - 0.1 K/uL Final  . Sodium 11/02/2015 141  135 - 145 mmol/L Final  . Potassium 11/02/2015 4.3  3.5 - 5.1 mmol/L Final  . Chloride 11/02/2015 108  101 - 111 mmol/L Final  . CO2 11/02/2015 24  22 - 32 mmol/L Final  . Glucose, Bld 11/02/2015 123* 65 - 99 mg/dL Final  . BUN 11/02/2015 14  6 - 20 mg/dL Final  . Creatinine, Ser 11/02/2015 0.71  0.61 - 1.24 mg/dL Final  . Calcium 11/02/2015 9.0  8.9 - 10.3 mg/dL Final  . Total Protein 11/02/2015 6.6  6.5 - 8.1 g/dL Final  . Albumin 11/02/2015 3.7  3.5 - 5.0 g/dL Final  . AST 11/02/2015 30  15 - 41 U/L Final  . ALT 11/02/2015 23  17 - 63 U/L Final  . Alkaline Phosphatase 11/02/2015 43  38 - 126 U/L Final  . Total Bilirubin 11/02/2015 0.4  0.3 - 1.2 mg/dL Final  . GFR calc non Af Amer 11/02/2015 >60  >60 mL/min Final  . GFR calc Af Amer 11/02/2015 >60  >60 mL/min Final   Comment: (NOTE) The eGFR has been calculated using the CKD EPI equation. This calculation has not been validated in all clinical situations. eGFR's persistently <60 mL/min signify possible Chronic Kidney Disease.   . Anion gap 11/02/2015 9  5 - 15 Final  . Lipase 11/02/2015 25  11 - 51 U/L Final  . Color, Urine 11/02/2015 YELLOW  YELLOW Final  . APPearance 11/02/2015 CLEAR  CLEAR Final  . Specific Gravity, Urine 11/02/2015 1.018  1.005 - 1.030 Final  . pH 11/02/2015 6.5  5.0 - 8.0 Final  . Glucose, UA 11/02/2015 NEGATIVE  NEGATIVE mg/dL Final  . Hgb urine dipstick 11/02/2015 NEGATIVE  NEGATIVE Final  . Bilirubin Urine 11/02/2015 NEGATIVE  NEGATIVE Final  . Ketones, ur 11/02/2015 NEGATIVE  NEGATIVE mg/dL Final  . Protein, ur 11/02/2015 NEGATIVE  NEGATIVE mg/dL Final  . Nitrite  11/02/2015 NEGATIVE  NEGATIVE Final  . Leukocytes, UA 11/02/2015 NEGATIVE  NEGATIVE Final   MICROSCOPIC NOT DONE ON URINES WITH NEGATIVE PROTEIN, BLOOD, LEUKOCYTES, NITRITE, OR GLUCOSE <1000 mg/dL.  Admission on 10/29/2015, Discharged on 11/01/2015  Component Date Value Ref Range Status  . WBC 10/29/2015 5.5  4.0 - 10.5 K/uL Final  . RBC 10/29/2015 3.92* 4.22 - 5.81 MIL/uL Final  . Hemoglobin 10/29/2015 13.1  13.0 - 17.0 g/dL Final  .  HCT 10/29/2015 38.5* 39.0 - 52.0 % Final  . MCV 10/29/2015 98.2  78.0 - 100.0 fL Final  . MCH 10/29/2015 33.4  26.0 - 34.0 pg Final  . MCHC 10/29/2015 34.0  30.0 - 36.0 g/dL Final  . RDW 10/29/2015 14.1  11.5 - 15.5 % Final  . Platelets 10/29/2015 245  150 - 400 K/uL Final  . Neutrophils Relative % 10/29/2015 59   Final  . Neutro Abs 10/29/2015 3.3  1.7 - 7.7 K/uL Final  . Lymphocytes Relative 10/29/2015 27   Final  . Lymphs Abs 10/29/2015 1.5  0.7 - 4.0 K/uL Final  . Monocytes Relative 10/29/2015 8   Final  . Monocytes Absolute 10/29/2015 0.4  0.1 - 1.0 K/uL Final  . Eosinophils Relative 10/29/2015 5   Final  . Eosinophils Absolute 10/29/2015 0.3  0.0 - 0.7 K/uL Final  . Basophils Relative 10/29/2015 1   Final  . Basophils Absolute 10/29/2015 0.1  0.0 - 0.1 K/uL Final  . Sodium 10/29/2015 146* 135 - 145 mmol/L Final  . Potassium 10/29/2015 3.1* 3.5 - 5.1 mmol/L Final  . Chloride 10/29/2015 110  101 - 111 mmol/L Final  . CO2 10/29/2015 29  22 - 32 mmol/L Final  . Glucose, Bld 10/29/2015 109* 65 - 99 mg/dL Final  . BUN 10/29/2015 25* 6 - 20 mg/dL Final  . Creatinine, Ser 10/29/2015 0.82  0.61 - 1.24 mg/dL Final  . Calcium 10/29/2015 9.0  8.9 - 10.3 mg/dL Final  . Total Protein 10/29/2015 6.6  6.5 - 8.1 g/dL Final  . Albumin 10/29/2015 4.0  3.5 - 5.0 g/dL Final  . AST 10/29/2015 24  15 - 41 U/L Final  . ALT 10/29/2015 23  17 - 63 U/L Final  . Alkaline Phosphatase 10/29/2015 40  38 - 126 U/L Final  . Total Bilirubin 10/29/2015 0.5  0.3 - 1.2 mg/dL  Final  . GFR calc non Af Amer 10/29/2015 >60  >60 mL/min Final  . GFR calc Af Amer 10/29/2015 >60  >60 mL/min Final   Comment: (NOTE) The eGFR has been calculated using the CKD EPI equation. This calculation has not been validated in all clinical situations. eGFR's persistently <60 mL/min signify possible Chronic Kidney Disease.   . Anion gap 10/29/2015 7  5 - 15 Final  . Troponin i, poc 10/29/2015 0.00  0.00 - 0.08 ng/mL Final  . Comment 3 10/29/2015          Final   Comment: Due to the release kinetics of cTnI, a negative result within the first hours of the onset of symptoms does not rule out myocardial infarction with certainty. If myocardial infarction is still suspected, repeat the test at appropriate intervals.   . Color, Urine 10/29/2015 AMBER* YELLOW Final   BIOCHEMICALS MAY BE AFFECTED BY COLOR  . APPearance 10/29/2015 CLEAR  CLEAR Final  . Specific Gravity, Urine 10/29/2015 1.039* 1.005 - 1.030 Final  . pH 10/29/2015 6.0  5.0 - 8.0 Final  . Glucose, UA 10/29/2015 NEGATIVE  NEGATIVE mg/dL Final  . Hgb urine dipstick 10/29/2015 NEGATIVE  NEGATIVE Final  . Bilirubin Urine 10/29/2015 NEGATIVE  NEGATIVE Final  . Ketones, ur 10/29/2015 NEGATIVE  NEGATIVE mg/dL Final  . Protein, ur 10/29/2015 NEGATIVE  NEGATIVE mg/dL Final  . Nitrite 10/29/2015 NEGATIVE  NEGATIVE Final  . Leukocytes, UA 10/29/2015 TRACE* NEGATIVE Final  . Lactic Acid, Venous 10/29/2015 1.01  0.5 - 2.0 mmol/L Final  . Squamous Epithelial / LPF 10/29/2015 NONE SEEN  NONE  SEEN Final  . WBC, UA 10/29/2015 0-5  0 - 5 WBC/hpf Final  . RBC / HPF 10/29/2015 NONE SEEN  0 - 5 RBC/hpf Final  . Bacteria, UA 10/29/2015 NONE SEEN  NONE SEEN Final  . Magnesium 10/29/2015 1.9  1.7 - 2.4 mg/dL Final  . Alcohol, Ethyl (B) 10/29/2015 <5  <5 mg/dL Final   Comment:        LOWEST DETECTABLE LIMIT FOR SERUM ALCOHOL IS 5 mg/dL FOR MEDICAL PURPOSES ONLY   . Phosphorus 10/29/2015 2.7  2.5 - 4.6 mg/dL Final  . WBC 10/30/2015 6.2   4.0 - 10.5 K/uL Final  . RBC 10/30/2015 3.78* 4.22 - 5.81 MIL/uL Final  . Hemoglobin 10/30/2015 12.1* 13.0 - 17.0 g/dL Final  . HCT 10/30/2015 37.1* 39.0 - 52.0 % Final  . MCV 10/30/2015 98.1  78.0 - 100.0 fL Final  . MCH 10/30/2015 32.0  26.0 - 34.0 pg Final  . MCHC 10/30/2015 32.6  30.0 - 36.0 g/dL Final  . RDW 10/30/2015 14.0  11.5 - 15.5 % Final  . Platelets 10/30/2015 236  150 - 400 K/uL Final  . Neutrophils Relative % 10/30/2015 57   Final  . Neutro Abs 10/30/2015 3.5  1.7 - 7.7 K/uL Final  . Lymphocytes Relative 10/30/2015 27   Final  . Lymphs Abs 10/30/2015 1.6  0.7 - 4.0 K/uL Final  . Monocytes Relative 10/30/2015 9   Final  . Monocytes Absolute 10/30/2015 0.6  0.1 - 1.0 K/uL Final  . Eosinophils Relative 10/30/2015 6   Final  . Eosinophils Absolute 10/30/2015 0.3  0.0 - 0.7 K/uL Final  . Basophils Relative 10/30/2015 1   Final  . Basophils Absolute 10/30/2015 0.1  0.0 - 0.1 K/uL Final  . Sodium 10/30/2015 147* 135 - 145 mmol/L Final  . Potassium 10/30/2015 4.0  3.5 - 5.1 mmol/L Final   Comment: DELTA CHECK NOTED REPEATED TO VERIFY NO VISIBLE HEMOLYSIS   . Chloride 10/30/2015 113* 101 - 111 mmol/L Final  . CO2 10/30/2015 24  22 - 32 mmol/L Final  . Glucose, Bld 10/30/2015 83  65 - 99 mg/dL Final  . BUN 10/30/2015 20  6 - 20 mg/dL Final  . Creatinine, Ser 10/30/2015 0.83  0.61 - 1.24 mg/dL Final  . Calcium 10/30/2015 8.5* 8.9 - 10.3 mg/dL Final  . GFR calc non Af Amer 10/30/2015 >60  >60 mL/min Final  . GFR calc Af Amer 10/30/2015 >60  >60 mL/min Final   Comment: (NOTE) The eGFR has been calculated using the CKD EPI equation. This calculation has not been validated in all clinical situations. eGFR's persistently <60 mL/min signify possible Chronic Kidney Disease.   . Anion gap 10/30/2015 10  5 - 15 Final  . C Diff antigen 10/30/2015 NEGATIVE  NEGATIVE Final  . C Diff toxin 10/30/2015 NEGATIVE  NEGATIVE Final  . C Diff interpretation 10/30/2015 Negative for toxigenic  C. difficile   Final  . Glucose-Capillary 10/30/2015 134* 65 - 99 mg/dL Final  . Glucose-Capillary 10/30/2015 99  65 - 99 mg/dL Final  . Glucose-Capillary 10/30/2015 108* 65 - 99 mg/dL Final  . Sodium 10/31/2015 139  135 - 145 mmol/L Final   Comment: DELTA CHECK NOTED REPEATED TO VERIFY   . Potassium 10/31/2015 3.4* 3.5 - 5.1 mmol/L Final  . Chloride 10/31/2015 112* 101 - 111 mmol/L Final  . CO2 10/31/2015 23  22 - 32 mmol/L Final  . Glucose, Bld 10/31/2015 114* 65 - 99 mg/dL Final  .  BUN 10/31/2015 16  6 - 20 mg/dL Final  . Creatinine, Ser 10/31/2015 0.82  0.61 - 1.24 mg/dL Final  . Calcium 10/31/2015 8.0* 8.9 - 10.3 mg/dL Final  . GFR calc non Af Amer 10/31/2015 >60  >60 mL/min Final  . GFR calc Af Amer 10/31/2015 >60  >60 mL/min Final   Comment: (NOTE) The eGFR has been calculated using the CKD EPI equation. This calculation has not been validated in all clinical situations. eGFR's persistently <60 mL/min signify possible Chronic Kidney Disease.   . Anion gap 10/31/2015 4* 5 - 15 Final  . Glucose-Capillary 10/30/2015 110* 65 - 99 mg/dL Final  . Glucose-Capillary 10/31/2015 104* 65 - 99 mg/dL Final  . Glucose-Capillary 10/31/2015 90  65 - 99 mg/dL Final  . Glucose-Capillary 10/31/2015 93  65 - 99 mg/dL Final  . Sodium 11/01/2015 139  135 - 145 mmol/L Final  . Potassium 11/01/2015 3.6  3.5 - 5.1 mmol/L Final  . Chloride 11/01/2015 110  101 - 111 mmol/L Final  . CO2 11/01/2015 21* 22 - 32 mmol/L Final  . Glucose, Bld 11/01/2015 89  65 - 99 mg/dL Final  . BUN 11/01/2015 14  6 - 20 mg/dL Final  . Creatinine, Ser 11/01/2015 0.81  0.61 - 1.24 mg/dL Final  . Calcium 11/01/2015 8.1* 8.9 - 10.3 mg/dL Final  . Phosphorus 11/01/2015 2.3* 2.5 - 4.6 mg/dL Final  . Albumin 11/01/2015 2.9* 3.5 - 5.0 g/dL Final  . GFR calc non Af Amer 11/01/2015 >60  >60 mL/min Final  . GFR calc Af Amer 11/01/2015 >60  >60 mL/min Final   Comment: (NOTE) The eGFR has been calculated using the CKD EPI  equation. This calculation has not been validated in all clinical situations. eGFR's persistently <60 mL/min signify possible Chronic Kidney Disease.   . Anion gap 11/01/2015 8  5 - 15 Final  . Glucose-Capillary 10/31/2015 97  65 - 99 mg/dL Final  . Glucose-Capillary 11/01/2015 96  65 - 99 mg/dL Final  . Glucose-Capillary 11/01/2015 100* 65 - 99 mg/dL Final  Admission on 10/17/2015, Discharged on 10/23/2015  Component Date Value Ref Range Status  . Lipase 10/17/2015 24  11 - 51 U/L Final  . Sodium 10/17/2015 141  135 - 145 mmol/L Final  . Potassium 10/17/2015 3.5  3.5 - 5.1 mmol/L Final  . Chloride 10/17/2015 106  101 - 111 mmol/L Final  . CO2 10/17/2015 22  22 - 32 mmol/L Final  . Glucose, Bld 10/17/2015 76  65 - 99 mg/dL Final  . BUN 10/17/2015 13  6 - 20 mg/dL Final  . Creatinine, Ser 10/17/2015 1.00  0.61 - 1.24 mg/dL Final  . Calcium 10/17/2015 9.2  8.9 - 10.3 mg/dL Final  . Total Protein 10/17/2015 6.6  6.5 - 8.1 g/dL Final  . Albumin 10/17/2015 3.9  3.5 - 5.0 g/dL Final  . AST 10/17/2015 25  15 - 41 U/L Final  . ALT 10/17/2015 14* 17 - 63 U/L Final  . Alkaline Phosphatase 10/17/2015 50  38 - 126 U/L Final  . Total Bilirubin 10/17/2015 0.7  0.3 - 1.2 mg/dL Final  . GFR calc non Af Amer 10/17/2015 >60  >60 mL/min Final  . GFR calc Af Amer 10/17/2015 >60  >60 mL/min Final   Comment: (NOTE) The eGFR has been calculated using the CKD EPI equation. This calculation has not been validated in all clinical situations. eGFR's persistently <60 mL/min signify possible Chronic Kidney Disease.   Georgiann Hahn gap 10/17/2015  13  5 - 15 Final  . WBC 10/17/2015 4.7  4.0 - 10.5 K/uL Final  . RBC 10/17/2015 4.51  4.22 - 5.81 MIL/uL Final  . Hemoglobin 10/17/2015 15.0  13.0 - 17.0 g/dL Final  . HCT 10/17/2015 44.4  39.0 - 52.0 % Final  . MCV 10/17/2015 98.4  78.0 - 100.0 fL Final  . MCH 10/17/2015 33.3  26.0 - 34.0 pg Final  . MCHC 10/17/2015 33.8  30.0 - 36.0 g/dL Final  . RDW 10/17/2015 13.9   11.5 - 15.5 % Final  . Platelets 10/17/2015 248  150 - 400 K/uL Final  . Color, Urine 10/17/2015 YELLOW  YELLOW Final  . APPearance 10/17/2015 CLEAR  CLEAR Final  . Specific Gravity, Urine 10/17/2015 1.025  1.005 - 1.030 Final  . pH 10/17/2015 5.5  5.0 - 8.0 Final  . Glucose, UA 10/17/2015 NEGATIVE  NEGATIVE mg/dL Final  . Hgb urine dipstick 10/17/2015 NEGATIVE  NEGATIVE Final  . Bilirubin Urine 10/17/2015 MODERATE* NEGATIVE Final  . Ketones, ur 10/17/2015 40* NEGATIVE mg/dL Final  . Protein, ur 10/17/2015 NEGATIVE  NEGATIVE mg/dL Final  . Nitrite 10/17/2015 NEGATIVE  NEGATIVE Final  . Leukocytes, UA 10/17/2015 NEGATIVE  NEGATIVE Final   MICROSCOPIC NOT DONE ON URINES WITH NEGATIVE PROTEIN, BLOOD, LEUKOCYTES, NITRITE, OR GLUCOSE <1000 mg/dL.  . Lactic Acid, Venous 10/17/2015 2.59* 0.5 - 2.0 mmol/L Final  . Comment 10/17/2015 NOTIFIED PHYSICIAN   Final  . Lactic Acid, Venous 10/18/2015 1.25  0.5 - 2.0 mmol/L Final  . D-Dimer, Quant 10/18/2015 0.41  0.00 - 0.50 ug/mL-FEU Final   Comment: (NOTE) At the manufacturer cut-off of 0.50 ug/mL FEU, this assay has been documented to exclude PE with a sensitivity and negative predictive value of 97 to 99%.  At this time, this assay has not been approved by the FDA to exclude DVT/VTE. Results should be correlated with clinical presentation.   . Amylase 10/18/2015 43  28 - 100 U/L Final  . CRP 10/18/2015 <0.5  <1.0 mg/dL Final  . Sed Rate 10/18/2015 7  0 - 16 mm/hr Final  . Specimen Description 10/18/2015 URINE, CLEAN CATCH   Final  . Special Requests 10/18/2015 NONE   Final  . Culture 10/18/2015 2,000 COLONIES/mL INSIGNIFICANT GROWTH   Final  . Report Status 10/18/2015 10/20/2015 FINAL   Final  . Sodium 10/19/2015 140  135 - 145 mmol/L Final  . Potassium 10/19/2015 2.9* 3.5 - 5.1 mmol/L Final  . Chloride 10/19/2015 106  101 - 111 mmol/L Final  . CO2 10/19/2015 26  22 - 32 mmol/L Final  . Glucose, Bld 10/19/2015 105* 65 - 99 mg/dL Final  .  BUN 10/19/2015 <5* 6 - 20 mg/dL Final  . Creatinine, Ser 10/19/2015 0.70  0.61 - 1.24 mg/dL Final  . Calcium 10/19/2015 7.9* 8.9 - 10.3 mg/dL Final  . GFR calc non Af Amer 10/19/2015 >60  >60 mL/min Final  . GFR calc Af Amer 10/19/2015 >60  >60 mL/min Final   Comment: (NOTE) The eGFR has been calculated using the CKD EPI equation. This calculation has not been validated in all clinical situations. eGFR's persistently <60 mL/min signify possible Chronic Kidney Disease.   . Anion gap 10/19/2015 8  5 - 15 Final  . WBC 10/19/2015 3.6* 4.0 - 10.5 K/uL Final  . RBC 10/19/2015 3.99* 4.22 - 5.81 MIL/uL Final  . Hemoglobin 10/19/2015 13.1  13.0 - 17.0 g/dL Final  . HCT 10/19/2015 38.6* 39.0 - 52.0 % Final  .  MCV 10/19/2015 96.7  78.0 - 100.0 fL Final  . MCH 10/19/2015 32.8  26.0 - 34.0 pg Final  . MCHC 10/19/2015 33.9  30.0 - 36.0 g/dL Final  . RDW 10/19/2015 13.5  11.5 - 15.5 % Final  . Platelets 10/19/2015 195  150 - 400 K/uL Final  . WBC 10/20/2015 4.9  4.0 - 10.5 K/uL Final  . RBC 10/20/2015 4.31  4.22 - 5.81 MIL/uL Final  . Hemoglobin 10/20/2015 14.2  13.0 - 17.0 g/dL Final  . HCT 10/20/2015 41.7  39.0 - 52.0 % Final  . MCV 10/20/2015 96.8  78.0 - 100.0 fL Final  . MCH 10/20/2015 32.9  26.0 - 34.0 pg Final  . MCHC 10/20/2015 34.1  30.0 - 36.0 g/dL Final  . RDW 10/20/2015 13.4  11.5 - 15.5 % Final  . Platelets 10/20/2015 191  150 - 400 K/uL Final  . Sodium 10/20/2015 137  135 - 145 mmol/L Final  . Potassium 10/20/2015 3.9  3.5 - 5.1 mmol/L Final  . Chloride 10/20/2015 101  101 - 111 mmol/L Final  . CO2 10/20/2015 28  22 - 32 mmol/L Final  . Glucose, Bld 10/20/2015 149* 65 - 99 mg/dL Final  . BUN 10/20/2015 <5* 6 - 20 mg/dL Final  . Creatinine, Ser 10/20/2015 0.70  0.61 - 1.24 mg/dL Final  . Calcium 10/20/2015 8.8* 8.9 - 10.3 mg/dL Final  . GFR calc non Af Amer 10/20/2015 >60  >60 mL/min Final  . GFR calc Af Amer 10/20/2015 >60  >60 mL/min Final   Comment: (NOTE) The eGFR has been  calculated using the CKD EPI equation. This calculation has not been validated in all clinical situations. eGFR's persistently <60 mL/min signify possible Chronic Kidney Disease.   . Anion gap 10/20/2015 8  5 - 15 Final    Dg Chest 2 View  10/29/2015  CLINICAL DATA:  Altered mental status. EXAM: CHEST  2 VIEW COMPARISON:  07/25/2015, 08/04/2013 and 07/15/2013 FINDINGS: Heart size and pulmonary vascularity are normal. Calcification in the thoracic aorta. Bilateral apical pleural thickening, right more than left. Large hiatal hernia.  Old thoracic compression fractures. IMPRESSION: No acute abnormality. Large hiatal hernia. Old compression fractures. Electronically Signed   By: Lorriane Shire M.D.   On: 10/29/2015 16:57   US Pelvis Limited  10/18/2015  CLINICAL DATA:  Lower abdominal pain.  Question inguinal hernia. EXAM: LIMITED ULTRASOUND OF PELVIS TECHNIQUE: Limited transabdominal ultrasound examination of the pelvis was performed. COMPARISON:  CT abdomen pelvis 10/18/2015. FINDINGS: No inguinal hernia. No solid or cystic mass. Incidental imaging of the left epididymis shows a 1.5 cm cyst or spermatocele. IMPRESSION: No evidence of an inguinal hernia. Electronically Signed   By: Lorin Picket M.D.   On: 10/18/2015 08:08   Ct Abdomen Pelvis W Contrast  11/02/2015  CLINICAL DATA:  Abdominal pain.  Inguinal pain. EXAM: CT ABDOMEN AND PELVIS WITH CONTRAST TECHNIQUE: Multidetector CT imaging of the abdomen and pelvis was performed using the standard protocol following bolus administration of intravenous contrast. CONTRAST:  5m OMNIPAQUE IOHEXOL 300 MG/ML SOLN, 1066mOMNIPAQUE IOHEXOL 300 MG/ML SOLN COMPARISON:  CT 2 weeks prior 10/18/2015 FINDINGS: Lower chest: Mild dependent atelectasis bilaterally. Moderate-sized hiatal hernia, similar allowing for differences in technique. Liver: Cyst in the left lobe is unchanged from prior exam. Lobulated vascular lesion in the periphery of the right lobe is is  also unchanged. No new lesion. Hepatobiliary: Gallbladder physiologically distended, no calcified stone. No biliary dilatation. Pancreas: No ductal dilatation or inflammation.  Spleen: Normal in size.  Punctate granuloma. Adrenal glands: No nodule. Kidneys: Symmetric renal enhancement. No hydronephrosis. Multiple renal cysts including parapelvic cysts in the left kidney are unchanged. Punctate nonobstructing nephrolithiasis in the right kidney is unchanged. Stomach/Bowel: Stomach physiologically distended. There are no dilated or thickened small bowel loops. Small volume of stool throughout the colon. No definite colonic wall thickening, the previous questioned colonic wall thickening in the rectosigmoid region is not seen. Diverticulosis without diverticulitis. The appendix is normal. Vascular/Lymphatic: No retroperitoneal adenopathy. Abdominal aorta is normal in caliber. Advanced atherosclerosis without aneurysm. Reproductive: Heterogeneous enlarged prostate gland. Bladder: Physiologically distended with diffuse wall thickening and multi focal bladder diverticula. Other: No free air, free fluid, or intra-abdominal fluid collection. Fat within the left inguinal canal without bowel containing hernia. Musculoskeletal: There are no acute or suspicious osseous abnormalities. Stable degenerative change in the spine. Bilateral L5 pars defects with listhesis unchanged. The compression deformity of T12 unchanged. IMPRESSION: 1. No acute abnormality in the abdomen/pelvis. 2. Resolution of previous apparent wall thickening involving the colon. 3. Diffuse bladder wall thickening with bladder diverticula consistent with chronic outlet obstruction. Additional chronic findings are stable. Electronically Signed   By: Jeb Levering M.D.   On: 11/02/2015 22:21   Ct Abdomen Pelvis W Contrast  10/18/2015  CLINICAL DATA:  82 year old male with abdominal pain and episodes of nausea vomiting and constipation. EXAM: CT ABDOMEN AND  PELVIS WITH CONTRAST TECHNIQUE: Multidetector CT imaging of the abdomen and pelvis was performed using the standard protocol following bolus administration of intravenous contrast. CONTRAST:  68m OMNIPAQUE IOHEXOL 300 MG/ML  SOLN COMPARISON:  CT dated 06/01/2015 FINDINGS: Bibasilar subsegmental linear atelectasis/ scarring. The visualized lung bases are otherwise clear. There is coronary vascular calcification. No intra-abdominal free air or free fluid. Stable appearing left hepatic cystic lesion. Stable appearing lobulated vascular lesion in the periphery of the right lobe of the liver most compatible with a portal venous shunting. The gallbladder, pancreas, spleen, and the adrenal glands appear unremarkable. Stable appearing left renal hypodense lesions, likely cysts. Small left renal parapelvic cysts noted. There is a 3 mm nonobstructing right renal interpolar calculus. No hydronephrosis on either side. The visualized ureters appear unremarkable. There is diffuse thickening and trabecular appearance of the bladder wall compatible with chronic bladder outlet obstruction. The prostate gland is enlarged with median lobe hypertrophy. There is a large hiatal hernia with gastroesophageal reflux. Oral contrast opacifies the stomach and multiple loops of small bowel. There is no evidence of bowel obstruction or inflammation. There is apparent diffuse thickening of the wall of the colon predominantly involving the rectosigmoid which may be related to underdistention. Colitis is not excluded. Clinical correlation is recommended. There is sigmoid diverticulosis without active inflammation. Normal appendix. Advanced aortoiliac atherosclerotic disease. The origins of the celiac axis, SMA, IMA as well as the origins of the renal arteries appear patent. No portal venous gas identified. There is no adenopathy. The abdominal wall soft tissues appear unremarkable. There is osteopenia with extensive degenerative changes of the  spine. Old healed left posterior rib fractures. No acute fracture identified. There are bilateral L5 pars defects with grade 1 L5-S1 anterolisthesis. There is T12 compression fractures similar to prior study. IMPRESSION: Under distention versus colitis. Correlation with clinical exam and stool cultures recommended. No bowel obstruction. Normal appendix. Moderate-sized hiatal hernia. Diffuse bladder wall thickening sequela of chronic bladder outlet obstruction. Correlation with urinalysis recommended to exclude superimposed UTI. Other findings similar to the prior CT. Electronically Signed  By: Anner Crete M.D.   On: 10/18/2015 01:02   Dg Abd Acute W/chest  11/06/2015  CLINICAL DATA:  Abdominal pain. Bowel movement 2 days ago. Patient reports inguinal pain. EXAM: DG ABDOMEN ACUTE W/ 1V CHEST COMPARISON:  CT 4 days prior 11/02/2015 FINDINGS: Heart size is normal, mild tortuosity and atherosclerosis of the thoracic aorta. Retrocardiac hiatal hernia again seen. The lungs are clear. There is no free intra-abdominal air. No dilated bowel loops to suggest obstruction. Small volume of stool throughout the colon. Small calculus again projects over the right renal bed. No acute osseous abnormalities are seen. There is scoliotic curvature of the spine. IMPRESSION: No bowel obstruction or free air. Retrocardiac hiatal hernia. Right nephrolithiasis again seen. Electronically Signed   By: Jeb Levering M.D.   On: 11/06/2015 22:56     Assessment/Plan   ICD-9-CM ICD-10-CM   1. Physical deconditioning 799.3 R53.81   2. Alzheimer's dementia without behavioral disturbance, unspecified timing of dementia onset 331.0 G30.9    294.10 F02.80   3. Colitis 558.9 K52.9   4. H/O prostate cancer V10.46 Z85.46   5. GAIT DISTURBANCE 781.2 R26.9   6. Alcohol abuse 305.00 F10.10   7. Bladder wall thickening 596.89 N32.89      F/u with urology as scheduled to eval bladder wall thickening  Cont current meds as  ordered  PT/OT as ordered  Nutritional supplements as indicated  Etoh use cessation discussed and highly urged  GOAL: short term rehab followed by long term care. Communicated with pt and nursing.  Will follow  Berdena Cisek S. Perlie Gold  [facility] [address] [place], [place] [address] [phone] Cell (Monday-Friday 8 AM - 5 PM) [phone] After 5 PM and follow prompts

## 2015-11-17 ENCOUNTER — Encounter: Payer: Self-pay | Admitting: Adult Health

## 2015-11-17 DIAGNOSIS — I10 Essential (primary) hypertension: Secondary | ICD-10-CM | POA: Insufficient documentation

## 2015-11-17 HISTORY — DX: Essential (primary) hypertension: I10

## 2015-11-17 NOTE — Progress Notes (Signed)
Patient ID: [name] Rice, male   DOB: 1925/06/04, 82 y.o.   MRN: [id number]    Facility: [facility]      No Known Allergies  Chief Complaint  Patient presents with  . Hospitalization Follow-up    HPI:  He had been in the ED for abdominal pain. He has had episodes of this pain in the past. He is a somewhat poor historian. He tells me that he is not having any pain of concerns today. He tells me that he will be going home as soon he is able. This does represent a long term placement for him. There are no nursing concerns.    Past Medical History  Diagnosis Date  . Arthritis   . Cancer [facility])     prostate   . Stroke ([place])   . 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 Nasal congestion   . Hearing loss   . Difficulty urinating   . Weakness   . Dementia   . Diabetes mellitus without complication [facility])     Past Surgical History  Procedure Laterality Date  . Hernia repair  2012   Family History  Problem Relation Age of Onset  . Heart attack Father   . Diabetes Brother   . Hypertension Brother     Social History   Social History  . Marital Status: Widowed    Spouse Name: N/A  . Number of Children: N/A  . Years of Education: N/A   Occupational History  . Not on file.   Social History Main Topics  . Smoking status: Current Every Day Smoker  . Smokeless tobacco: Not on file  . Alcohol Use: Yes     Comment: daily  . Drug Use: No  . Sexual Activity: No   Other Topics Concern  . Not on file   Social History Narrative     VITAL SIGNS BP 134/51 mmHg  Pulse 61  Temp(Src) 97.7 F (36.5 C)  Ht 5' (1.524 m)  Wt 102 lb 12.8 oz (46.63 kg)  BMI 20.08 kg/m2  SpO2 98%  Patient's Medications  New Prescriptions   No medications on file  Previous Medications   CAPSAICIN (ZOSTRIX) 0.025 % CREAM    Apply 1 application topically daily as needed.   CARBAMIDE PEROXIDE (DEBROX) 6.5 % OTIC SOLUTION    Place 5 drops into both ears daily as needed (wax bld  up).   DONEPEZIL (ARICEPT) 10 MG TABLET    Take 1 tablet (10 mg total) by mouth at bedtime.   ISOSORBIDE MONONITRATE (IMDUR) 30 MG 24 HR TABLET    Take 1 tablet (30 mg total) by mouth daily.   METOPROLOL SUCCINATE (TOPROL-XL) 25 MG 24 HR TABLET    Take 1 tablet (25 mg total) by mouth daily.   MULTIPLE VITAMINS-MINERALS (MULTIVITAMIN) TABLET    Take 1 tablet by mouth daily.   SERTRALINE (ZOLOFT) 50 MG TABLET    Take 0.5 tablets (25 mg total) by mouth daily.   ZOLPIDEM (AMBIEN) 5 MG TABLET    Take 1 tablet (5 mg total) by mouth at bedtime as needed for sleep.  Modified Medications   No medications on file  Discontinued Medications     SIGNIFICANT DIAGNOSTIC EXAMS  10-29-15: chest x-ray: No acute abnormality. Large hiatal hernia. Old compression fractures.  11-02-15: ct of abdomen and pelvis: 1. No acute abnormality in the abdomen/pelvis. 2. Resolution of previous apparent wall thickening involving the colon. 3. Diffuse bladder  wall thickening with bladder diverticula consistent with chronic outlet obstruction. Additional chronic findings are stable.  11-06-15: acute abdomen x-ray: No bowel obstruction or free air. Retrocardiac hiatal hernia. Right nephrolithiasis again seen.   LABS REVIEWED:   10-29-15: wbc 5.5; hgb 13.1; hct 38.5; mcv 98.2; plt 245; glucose 109; bun 25; creat 0.82; k+ 3.1; na++146; ;liver normal albumin 4.0; mag 1.9; phos 2.7 10-30-15; c-diff: neg 11-06-15: wbc 6.4; hgb 14.2; hct 41.7; mcv 98.6; plt 270; glucose 72; bun 15; creat 0.84; k+ 3.8; na++145; liver normal albumin 3.6; lipase 33.    Review of Systems  Constitutional: Negative for malaise/fatigue.  Respiratory: Negative for cough and shortness of breath.   Cardiovascular: Negative for chest pain, palpitations and leg swelling.  Gastrointestinal: Negative for heartburn, nausea, abdominal pain, diarrhea and constipation.  Musculoskeletal: Negative for myalgias and joint pain.  Skin: Negative.   Neurological: Negative  for headaches.  Psychiatric/Behavioral: Negative for depression. The patient is not nervous/anxious.      Physical Exam  Constitutional: No distress.  Frail   Eyes: Conjunctivae are normal.  Neck: Neck supple. No JVD present. No thyromegaly present.  Cardiovascular: Normal rate, regular rhythm and intact distal pulses.   Respiratory: Effort normal and breath sounds normal. No respiratory distress. He has no wheezes.  GI: Soft. Bowel sounds are normal. He exhibits no distension. There is no tenderness.  Musculoskeletal: He exhibits no edema.  Able to move all extremities   Lymphadenopathy:    He has no cervical adenopathy.  Neurological: He is alert.  Skin: Skin is warm and dry. He is not diaphoretic.  Psychiatric: He has a normal mood and affect.      ASSESSMENT/ PLAN:  1. Alzheimer's disease: no significant change in his status; will continue his aricept 10 mg nightly and will monitor  2. Hypertension: will continue toprol xl 25 mg daily and will continue imdur 30 mg daily for his chest pain.   3. Anxiety with depression: will continue zoloft 25 mg daily   4. Insomnia: has ambein 5 mg nightly as needed   5. Prostate cancer: gleason grade 7: is currently not on treatment will monitor   6. Bilateral shoulder pain: will continue capsaicin 0.025% cream daily as needed for joint pain  7. Lower abdominal pain: does have a right inguinal hernia; is presently not having pain will continue to monitor his status     Time spent with patient  50  minutes >50% time spent counseling; reviewing medical record; tests; labs; and developing future plan of care      Ok Edwards NP [facility]  Contact [phone] Monday through Friday 8am- 5pm  After hours call [phone]

## 2015-12-14 ENCOUNTER — Non-Acute Institutional Stay: Payer: Medicare HMO | Admitting: Adult Health

## 2015-12-14 ENCOUNTER — Encounter: Payer: Self-pay | Admitting: Adult Health

## 2015-12-14 DIAGNOSIS — I10 Essential (primary) hypertension: Secondary | ICD-10-CM

## 2015-12-14 DIAGNOSIS — G309 Alzheimer's disease, unspecified: Secondary | ICD-10-CM | POA: Diagnosis not present

## 2015-12-14 DIAGNOSIS — M25511 Pain in right shoulder: Secondary | ICD-10-CM | POA: Diagnosis not present

## 2015-12-14 DIAGNOSIS — M25512 Pain in left shoulder: Secondary | ICD-10-CM

## 2015-12-14 DIAGNOSIS — Z8546 Personal history of malignant neoplasm of prostate: Secondary | ICD-10-CM | POA: Diagnosis not present

## 2015-12-14 DIAGNOSIS — R1030 Lower abdominal pain, unspecified: Secondary | ICD-10-CM | POA: Diagnosis not present

## 2015-12-14 DIAGNOSIS — F0280 Dementia in other diseases classified elsewhere without behavioral disturbance: Secondary | ICD-10-CM | POA: Diagnosis not present

## 2015-12-14 NOTE — Progress Notes (Signed)
Patient ID: [name] Rice, male   DOB: [birth year], 82 y.o.   MRN: [id number]   Facility: [facility]       No Known Allergies  Chief Complaint  Patient presents with  . Medical Management of Chronic Issues    Follow up    HPI:  He is a resident of this facility being seen for the management of his chronic illnesses. He tells me that he used the body wash from the facility on his face and now has a very irritated area. He would also like to have beer at night. He is not voicing any other complaints. There are no nursing concerns at this time.    Past Medical History  Diagnosis Date  . Arthritis   . Cancer [facility])     prostate   . Stroke ([place])   . 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 Nasal congestion   . Hearing loss   . Difficulty urinating   . Weakness   . Dementia   . Diabetes mellitus without complication [facility])     Past Surgical History  Procedure Laterality Date  . Hernia repair  2012    VITAL SIGNS BP 110/72 mmHg  Pulse 60  Temp(Src) 99.8 F (37.7 C) (Oral)  Resp 20  Ht 5' (1.524 m)  Wt 100 lb (45.36 kg)  BMI 19.53 kg/m2  SpO2 93%  Patient's Medications  New Prescriptions   No medications on file  Previous Medications   CAPSAICIN (ZOSTRIX) 0.025 % CREAM    Apply 1 application topically daily as needed.   CARBAMIDE PEROXIDE (DEBROX) 6.5 % OTIC SOLUTION    Place 5 drops into both ears daily as needed (wax bld up).   DONEPEZIL (ARICEPT) 10 MG TABLET    Take 1 tablet (10 mg total) by mouth at bedtime.   ISOSORBIDE MONONITRATE (IMDUR) 30 MG 24 HR TABLET    Take 1 tablet (30 mg total) by mouth daily.   METOPROLOL SUCCINATE (TOPROL-XL) 25 MG 24 HR TABLET    Take 1 tablet (25 mg total) by mouth daily.   MULTIPLE VITAMINS-MINERALS (MULTIVITAMIN) TABLET    Take 1 tablet by mouth daily.   SERTRALINE (ZOLOFT) 50 MG TABLET    Take 0.5 tablets (25 mg total) by mouth daily.   ZOLPIDEM (AMBIEN) 5 MG TABLET    Take 1 tablet (5 mg total) by mouth at bedtime  as needed for sleep.  Modified Medications   No medications on file  Discontinued Medications   No medications on file     SIGNIFICANT DIAGNOSTIC EXAMS  10-29-15: chest x-ray: No acute abnormality. Large hiatal hernia. Old compression fractures.  11-02-15: ct of abdomen and pelvis: 1. No acute abnormality in the abdomen/pelvis. 2. Resolution of previous apparent wall thickening involving the colon. 3. Diffuse bladder wall thickening with bladder diverticula consistent with chronic outlet obstruction. Additional chronic findings are stable.  11-06-15: acute abdomen x-ray: No bowel obstruction or free air. Retrocardiac hiatal hernia. Right nephrolithiasis again seen.   LABS REVIEWED:   10-29-15: wbc 5.5; hgb 13.1; hct 38.5; mcv 98.2; plt 245; glucose 109; bun 25; creat 0.82; k+ 3.1; na++146; ;liver normal albumin 4.0; mag 1.9; phos 2.7 10-30-15; c-diff: neg 11-06-15: wbc 6.4; hgb 14.2; hct 41.7; mcv 98.6; plt 270; glucose 72; bun 15; creat 0.84; k+ 3.8; na++145; liver normal albumin 3.6; lipase 33.    Review of Systems  Constitutional: Negative for malaise/fatigue.  Respiratory: Negative for cough and  shortness of breath.   Cardiovascular: Negative for chest pain, palpitations and leg swelling.  Gastrointestinal: Negative for heartburn, nausea, abdominal pain, diarrhea and constipation.  Musculoskeletal: Negative for myalgias and joint pain.  Skin: Negative.   Neurological: Negative for headaches.  Psychiatric/Behavioral: Negative for depression. The patient is not nervous/anxious.      Physical Exam  Constitutional: No distress.  Frail   Eyes: Conjunctivae are normal.  Neck: Neck supple. No JVD present. No thyromegaly present.  Cardiovascular: Normal rate, regular rhythm and intact distal pulses.   Respiratory: Effort normal and breath sounds normal. No respiratory distress. He has no wheezes.  GI: Soft. Bowel sounds are normal. He exhibits no distension. There is no tenderness.    Musculoskeletal: He exhibits no edema.  Able to move all extremities   Lymphadenopathy:    He has no cervical adenopathy.  Neurological: He is alert.  Skin: Skin is warm and dry. He is not diaphoretic.  Psychiatric: He has a normal mood and affect.      ASSESSMENT/ PLAN:  1. Alzheimer's disease: no significant change in his status; will continue his aricept 10 mg nightly and will monitor  2. Hypertension: will continue toprol xl 25 mg daily and will continue imdur 30 mg daily for his chest pain.   3. Anxiety with depression: will continue zoloft 25 mg daily   4. Insomnia: has ambein 5 mg nightly as needed   5. Prostate cancer: gleason grade 7: is currently not on treatment will monitor    6. Bilateral shoulder pain: will continue capsaicin 0.025% cream daily as needed for joint pain  7. Lower abdominal pain: does have a right inguinal hernia; is presently not having pain will continue to monitor his status     For his dermatitis will use triamcinolone  Will let him have one beer nightly; family to provide       Ok Edwards NP [facility]  Contact [phone] Monday through Friday 8am- 5pm  After hours call [phone]

## 2016-01-11 ENCOUNTER — Non-Acute Institutional Stay: Payer: Medicare HMO | Admitting: Adult Health

## 2016-01-11 ENCOUNTER — Encounter: Payer: Self-pay | Admitting: Adult Health

## 2016-01-11 DIAGNOSIS — I10 Essential (primary) hypertension: Secondary | ICD-10-CM

## 2016-01-11 DIAGNOSIS — R1030 Lower abdominal pain, unspecified: Secondary | ICD-10-CM | POA: Diagnosis not present

## 2016-01-11 DIAGNOSIS — G4700 Insomnia, unspecified: Secondary | ICD-10-CM | POA: Diagnosis not present

## 2016-01-11 DIAGNOSIS — F0280 Dementia in other diseases classified elsewhere without behavioral disturbance: Secondary | ICD-10-CM

## 2016-01-11 DIAGNOSIS — G309 Alzheimer's disease, unspecified: Secondary | ICD-10-CM | POA: Diagnosis not present

## 2016-01-11 DIAGNOSIS — C61 Malignant neoplasm of prostate: Secondary | ICD-10-CM | POA: Diagnosis not present

## 2016-01-11 NOTE — Progress Notes (Signed)
Patient ID: [name] Rice, male   DOB: [birth year], 82 y.o.   MRN: [id number]   Facility[facility]       No Known Allergies  Chief Complaint  Patient presents with  . Medical Management of Chronic Issues    Follow up    HPI:  He is a long term resident of this facility being seen for the management of his chronic illnesses. Overall his status is stable. He tells me that he is feeling good and has no complaints. There are no nursing concerns at this time.   Past Medical History  Diagnosis Date  . Arthritis   . Cancer [facility])     prostate   . Stroke ([place])   . 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 Nasal congestion   . Hearing loss   . Difficulty urinating   . Weakness   . Dementia   . Diabetes mellitus without complication [facility])     Past Surgical History  Procedure Laterality Date  . Hernia repair  2012    VITAL SIGNS BP 101/56 mmHg  Pulse 60  Temp(Src) 99.6 F (37.6 C) (Oral)  Resp 20  Ht 5' (1.524 m)  Wt 107 lb 4 oz (48.648 kg)  BMI 20.95 kg/m2  SpO2 96%  Patient's Medications  New Prescriptions   No medications on file  Previous Medications   CAPSAICIN (ZOSTRIX) 0.025 % CREAM    Apply 1 application topically daily as needed.   CARBAMIDE PEROXIDE (DEBROX) 6.5 % OTIC SOLUTION    Place 5 drops into both ears daily as needed (wax bld up).   DONEPEZIL (ARICEPT) 10 MG TABLET    Take 1 tablet (10 mg total) by mouth at bedtime.   ISOSORBIDE MONONITRATE (IMDUR) 30 MG 24 HR TABLET    Take 1 tablet (30 mg total) by mouth daily.   METOPROLOL SUCCINATE (TOPROL-XL) 25 MG 24 HR TABLET    Take 1 tablet (25 mg total) by mouth daily.   MULTIPLE VITAMINS-MINERALS (MULTIVITAMIN) TABLET    Take 1 tablet by mouth daily.   SERTRALINE (ZOLOFT) 50 MG TABLET    Take 0.5 tablets (25 mg total) by mouth daily.   ZOLPIDEM (AMBIEN) 5 MG TABLET    Take 1 tablet (5 mg total) by mouth at bedtime as needed for sleep.  Modified Medications   No medications on file    Discontinued Medications   No medications on file     SIGNIFICANT DIAGNOSTIC EXAMS  10-29-15: chest x-ray: No acute abnormality. Large hiatal hernia. Old compression fractures.  11-02-15: ct of abdomen and pelvis: 1. No acute abnormality in the abdomen/pelvis. 2. Resolution of previous apparent wall thickening involving the colon. 3. Diffuse bladder wall thickening with bladder diverticula consistent with chronic outlet obstruction. Additional chronic findings are stable.  11-06-15: acute abdomen x-ray: No bowel obstruction or free air. Retrocardiac hiatal hernia. Right nephrolithiasis again seen.   LABS REVIEWED:   10-29-15: wbc 5.5; hgb 13.1; hct 38.5; mcv 98.2; plt 245; glucose 109; bun 25; creat 0.82; k+ 3.1; na++146; ;liver normal albumin 4.0; mag 1.9; phos 2.7 10-30-15; c-diff: neg 11-06-15: wbc 6.4; hgb 14.2; hct 41.7; mcv 98.6; plt 270; glucose 72; bun 15; creat 0.84; k+ 3.8; na++145; liver normal albumin 3.6; lipase 33.  11-10-15: wbc 5.5; hgb 12.9; hct 37.6; mcv 97.4; plt 272; glucose 69; bun 16; creat 0.81; k+ 4.1; na++140    Review of Systems  Constitutional: Negative for malaise/fatigue.  Respiratory:  Negative for cough and shortness of breath.   Cardiovascular: Negative for chest pain, palpitations and leg swelling.  Gastrointestinal: Negative for heartburn, nausea, abdominal pain, diarrhea and constipation.  Musculoskeletal: Negative for myalgias and joint pain.  Skin: Negative.   Neurological: Negative for headaches.  Psychiatric/Behavioral: Negative for depression. The patient is not nervous/anxious.      Physical Exam  Constitutional: No distress.  Frail   Eyes: Conjunctivae are normal.  Neck: Neck supple. No JVD present. No thyromegaly present.  Cardiovascular: Normal rate, regular rhythm and intact distal pulses.   Respiratory: Effort normal and breath sounds normal. No respiratory distress. He has no wheezes.  GI: Soft. Bowel sounds are normal. He exhibits no  distension. There is no tenderness.  Musculoskeletal: He exhibits no edema.  Able to move all extremities   Lymphadenopathy:    He has no cervical adenopathy.  Neurological: He is alert.  Skin: Skin is warm and dry. He is not diaphoretic.  Psychiatric: He has a normal mood and affect.      ASSESSMENT/ PLAN:  1. Alzheimer's disease: no significant change in his status; will continue his aricept 10 mg nightly and will monitor  2. Hypertension: will continue toprol xl 25 mg daily and will continue imdur 30 mg daily for his chest pain.   3. Anxiety with depression: will continue zoloft 25 mg daily   4. Insomnia: has ambein 5 mg nightly as needed   5. Prostate cancer: gleason grade 7: is currently not on treatment will monitor    6. Bilateral shoulder pain: will continue capsaicin 0.025% cream daily as needed for joint pain  7. Lower abdominal pain: does have a right inguinal hernia; is presently not having pain will continue to monitor his status        Ok Edwards NP [facility]  Contact [phone] Monday through Friday 8am- 5pm  After hours call [phone]

## 2016-01-22 ENCOUNTER — Non-Acute Institutional Stay: Payer: Medicare HMO | Admitting: Adult Health

## 2016-01-22 ENCOUNTER — Encounter: Payer: Self-pay | Admitting: Adult Health

## 2016-01-22 DIAGNOSIS — J302 Other seasonal allergic rhinitis: Secondary | ICD-10-CM

## 2016-01-22 DIAGNOSIS — J309 Allergic rhinitis, unspecified: Secondary | ICD-10-CM | POA: Insufficient documentation

## 2016-01-22 NOTE — Progress Notes (Signed)
Patient ID: [name] Rice, male   DOB: [birth year], 82 y.o.   MRN: [id number]   [facility]       No Known Allergies  Chief Complaint  Patient presents with  . Acute Visit    Nursing Concern    HPI:  He is having sinus congestion with cough present. There are no report of fever present. He is asking staff for benedryl for his "cold". There are no reports of fever present. There are no reports of changes in his appetite.    Past Medical History  Diagnosis Date  . Arthritis   . Cancer [facility])     prostate   . Stroke ([place])   . 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 Nasal congestion   . Hearing loss   . Difficulty urinating   . Weakness   . Dementia   . Diabetes mellitus without complication [facility])     Past Surgical History  Procedure Laterality Date  . Hernia repair  2012    VITAL SIGNS BP 158/72 mmHg  Pulse 61  Temp(Src) 96.9 F (36.1 C) (Oral)  Resp 18  Ht 5' (1.524 m)  Wt 107 lb 4 oz (48.648 kg)  BMI 20.95 kg/m2  SpO2 96%  Patient's Medications  New Prescriptions   No medications on file  Previous Medications   CAPSAICIN (ZOSTRIX) 0.025 % CREAM    Apply 1 application topically daily as needed.   CARBAMIDE PEROXIDE (DEBROX) 6.5 % OTIC SOLUTION    Place 5 drops into both ears daily as needed (wax bld up).   DONEPEZIL (ARICEPT) 10 MG TABLET    Take 1 tablet (10 mg total) by mouth at bedtime.   ISOSORBIDE MONONITRATE (IMDUR) 30 MG 24 HR TABLET    Take 1 tablet (30 mg total) by mouth daily.   METOPROLOL SUCCINATE (TOPROL-XL) 25 MG 24 HR TABLET    Take 1 tablet (25 mg total) by mouth daily.   MULTIPLE VITAMINS-MINERALS (MULTIVITAMIN) TABLET    Take 1 tablet by mouth daily.   NUTRITIONAL SUPPLEMENTS (NUTRITIONAL SUPPLEMENT PO)    Take 90 mLs by mouth 2 (two) times daily. Med Pass   SERTRALINE (ZOLOFT) 50 MG TABLET    Take 0.5 tablets (25 mg total) by mouth daily.   ZOLPIDEM (AMBIEN) 5 MG TABLET    Take 1 tablet (5 mg total) by mouth at bedtime  as needed for sleep.  Modified Medications   No medications on file  Discontinued Medications   No medications on file     SIGNIFICANT DIAGNOSTIC EXAMS  10-29-15: chest x-ray: No acute abnormality. Large hiatal hernia. Old compression fractures.  11-02-15: ct of abdomen and pelvis: 1. No acute abnormality in the abdomen/pelvis. 2. Resolution of previous apparent wall thickening involving the colon. 3. Diffuse bladder wall thickening with bladder diverticula consistent with chronic outlet obstruction. Additional chronic findings are stable.  11-06-15: acute abdomen x-ray: No bowel obstruction or free air. Retrocardiac hiatal hernia. Right nephrolithiasis again seen.   LABS REVIEWED:   10-29-15: wbc 5.5; hgb 13.1; hct 38.5; mcv 98.2; plt 245; glucose 109; bun 25; creat 0.82; k+ 3.1; na++146; ;liver normal albumin 4.0; mag 1.9; phos 2.7 10-30-15; c-diff: neg 11-06-15: wbc 6.4; hgb 14.2; hct 41.7; mcv 98.6; plt 270; glucose 72; bun 15; creat 0.84; k+ 3.8; na++145; liver normal albumin 3.6; lipase 33.  11-10-15: wbc 5.5; hgb 12.9; hct 37.6; mcv 97.4; plt 272; glucose 69; bun 16; creat 0.81; k+  4.1; na++140    Review of Systems  Constitutional: Negative for malaise/fatigue.  Respiratory: Negative for  shortness of breath.  has sinus congestion with cough Cardiovascular: Negative for chest pain, palpitations and leg swelling.  Gastrointestinal: Negative for heartburn, nausea, abdominal pain, diarrhea and constipation.  Musculoskeletal: Negative for myalgias and joint pain.  Skin: Negative.   Neurological: Negative for headaches.  Psychiatric/Behavioral: Negative for depression. The patient is not nervous/anxious.      Physical Exam  Constitutional: No distress.  Frail   Eyes: Conjunctivae are normal.  Neck: Neck supple. No JVD present. No thyromegaly present.  Cardiovascular: Normal rate, regular rhythm and intact distal pulses.   Respiratory: Effort normal and breath sounds normal. No  respiratory distress. He has no wheezes.  GI: Soft. Bowel sounds are normal. He exhibits no distension. There is no tenderness.  Musculoskeletal: He exhibits no edema.  Able to move all extremities   Lymphadenopathy:    He has no cervical adenopathy.  Neurological: He is alert.  Skin: Skin is warm and dry. He is not diaphoretic.  Psychiatric: He has a normal mood and affect.      ASSESSMENT/ PLAN:  1. Allergic rhinitis: will begin claritin 10 mg daily and will begin flonase nightly will monitor      Ok Edwards NP [facility]  Contact [phone] Monday through Friday 8am- 5pm  After hours call [phone]

## 2016-02-15 ENCOUNTER — Encounter: Payer: Self-pay | Admitting: Adult Health

## 2016-02-15 ENCOUNTER — Non-Acute Institutional Stay (SKILLED_NURSING_FACILITY): Payer: Medicare HMO | Admitting: Adult Health

## 2016-02-15 DIAGNOSIS — J302 Other seasonal allergic rhinitis: Secondary | ICD-10-CM | POA: Diagnosis not present

## 2016-02-15 DIAGNOSIS — G309 Alzheimer's disease, unspecified: Secondary | ICD-10-CM | POA: Diagnosis not present

## 2016-02-15 DIAGNOSIS — F0280 Dementia in other diseases classified elsewhere without behavioral disturbance: Secondary | ICD-10-CM

## 2016-02-15 DIAGNOSIS — I10 Essential (primary) hypertension: Secondary | ICD-10-CM

## 2016-02-15 DIAGNOSIS — C61 Malignant neoplasm of prostate: Secondary | ICD-10-CM

## 2016-02-15 DIAGNOSIS — M25512 Pain in left shoulder: Secondary | ICD-10-CM | POA: Diagnosis not present

## 2016-02-15 DIAGNOSIS — M25511 Pain in right shoulder: Secondary | ICD-10-CM

## 2016-02-15 DIAGNOSIS — R1084 Generalized abdominal pain: Secondary | ICD-10-CM | POA: Diagnosis not present

## 2016-02-15 NOTE — Progress Notes (Signed)
Patient ID: [name] Rice, male   DOB: 1925-04-13, 82 y.o.   MRN: [id number]   [facility]       No Known Allergies  Chief Complaint  Patient presents with  . Medical Management of Chronic Issues    Follow up    HPI:  He is a long term resident of this facility being seen for the management of his chronic illnesses. Overall his status is stable. He tells me that he is feeling good and has no concerns at this time. He is out of bed daily and is out in the hallway. There are no nursing concerns at this time.   Past Medical History  Diagnosis Date  . Arthritis   . Cancer [facility])     prostate   . Stroke ([place])   . 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 Nasal congestion   . Hearing loss   . Difficulty urinating   . Weakness   . Dementia   . Diabetes mellitus without complication [facility])     Past Surgical History  Procedure Laterality Date  . Hernia repair  2012    VITAL SIGNS BP 147/62 mmHg  Pulse 65  Temp(Src) 97.9 F (36.6 C) (Oral)  Resp 19  Ht 5' (1.524 m)  Wt 112 lb 6 oz (50.973 kg)  BMI 21.95 kg/m2  SpO2 96%  Patient's Medications  New Prescriptions   No medications on file  Previous Medications   CAPSAICIN (ZOSTRIX) 0.025 % CREAM    Apply 1 application topically daily as needed.   CARBAMIDE PEROXIDE (DEBROX) 6.5 % OTIC SOLUTION    Place 5 drops into both ears daily as needed (wax bld up).   DONEPEZIL (ARICEPT) 10 MG TABLET    Take 1 tablet (10 mg total) by mouth at bedtime.   FLUTICASONE (FLONASE) 50 MCG/ACT NASAL SPRAY    Place 2 sprays into both nostrils daily.   ISOSORBIDE MONONITRATE (IMDUR) 30 MG 24 HR TABLET    Take 1 tablet (30 mg total) by mouth daily.   LORATADINE (CLARITIN) 10 MG TABLET    Take 10 mg by mouth daily.   METOPROLOL SUCCINATE (TOPROL-XL) 25 MG 24 HR TABLET    Take 1 tablet (25 mg total) by mouth daily.   MULTIPLE VITAMINS-MINERALS (MULTIVITAMIN) TABLET    Take 1 tablet by mouth daily.   NUTRITIONAL SUPPLEMENTS  (NUTRITIONAL SUPPLEMENT PO)    Take 90 mLs by mouth 2 (two) times daily. Med Pass   SERTRALINE (ZOLOFT) 50 MG TABLET    Take 0.5 tablets (25 mg total) by mouth daily.   ZOLPIDEM (AMBIEN) 5 MG TABLET    Take 1 tablet (5 mg total) by mouth at bedtime as needed for sleep.  Modified Medications   No medications on file  Discontinued Medications   No medications on file     SIGNIFICANT DIAGNOSTIC EXAMS  10-29-15: chest x-ray: No acute abnormality. Large hiatal hernia. Old compression fractures.  11-02-15: ct of abdomen and pelvis: 1. No acute abnormality in the abdomen/pelvis. 2. Resolution of previous apparent wall thickening involving the colon. 3. Diffuse bladder wall thickening with bladder diverticula consistent with chronic outlet obstruction. Additional chronic findings are stable.  11-06-15: acute abdomen x-ray: No bowel obstruction or free air. Retrocardiac hiatal hernia. Right nephrolithiasis again seen.   LABS REVIEWED:   10-29-15: wbc 5.5; hgb 13.1; hct 38.5; mcv 98.2; plt 245; glucose 109; bun 25; creat 0.82; k+ 3.1; na++146; ;liver normal  albumin 4.0; mag 1.9; phos 2.7 10-30-15; c-diff: neg 11-06-15: wbc 6.4; hgb 14.2; hct 41.7; mcv 98.6; plt 270; glucose 72; bun 15; creat 0.84; k+ 3.8; na++145; liver normal albumin 3.6; lipase 33.  11-10-15: wbc 5.5; hgb 12.9; hct 37.6; mcv 97.4; plt 272; glucose 69; bun 16; creat 0.81; k+ 4.1; na++140    Review of Systems  Constitutional: Negative for malaise/fatigue.  Respiratory: Negative for  shortness of breath.   Cardiovascular: Negative for chest pain, palpitations and leg swelling.  Gastrointestinal: Negative for heartburn, nausea, abdominal pain, diarrhea and constipation.  Musculoskeletal: Negative for myalgias and joint pain.  Skin: Negative.   Neurological: Negative for headaches.  Psychiatric/Behavioral: Negative for depression. The patient is not nervous/anxious.      Physical Exam  Constitutional: No distress.  Frail   Eyes:  Conjunctivae are normal.  Neck: Neck supple. No JVD present. No thyromegaly present.  Cardiovascular: Normal rate, regular rhythm and intact distal pulses.   Respiratory: Effort normal and breath sounds normal. No respiratory distress. He has no wheezes.  GI: Soft. Bowel sounds are normal. He exhibits no distension. There is no tenderness.  Musculoskeletal: He exhibits no edema.  Able to move all extremities   Lymphadenopathy:    He has no cervical adenopathy.  Neurological: He is alert.  Skin: Skin is warm and dry. He is not diaphoretic.  Psychiatric: He has a normal mood and affect.      ASSESSMENT/ PLAN:   1. Alzheimer's disease: no significant change in his status; will continue his aricept 10 mg nightly and will monitor  2. Hypertension: will continue toprol xl 25 mg daily and will continue imdur 30 mg daily for his chest pain.   3. Anxiety with depression: will continue zoloft 25 mg daily   4. Insomnia: has ambein 5 mg nightly as needed   5. Prostate cancer: gleason grade 7: is currently not on treatment will monitor    6. Bilateral shoulder pain: will continue capsaicin 0.025% cream daily as needed for joint pain  7. Lower abdominal pain: does have a right inguinal hernia; is presently not having pain will continue to monitor his status   8. Allergic rhinitis: will begin claritin 10 mg daily and will begin flonase nightly will monitor       Ok Edwards NP [facility] Adult [facility]  Contact [phone] Monday through Friday 8am- 5pm  After hours call [phone]

## 2016-03-26 ENCOUNTER — Non-Acute Institutional Stay (SKILLED_NURSING_FACILITY): Payer: Medicare HMO | Admitting: Internal Medicine

## 2016-03-26 ENCOUNTER — Encounter: Payer: Self-pay | Admitting: Internal Medicine

## 2016-03-26 DIAGNOSIS — R269 Unspecified abnormalities of gait and mobility: Secondary | ICD-10-CM | POA: Diagnosis not present

## 2016-03-26 DIAGNOSIS — C61 Malignant neoplasm of prostate: Secondary | ICD-10-CM | POA: Diagnosis not present

## 2016-03-26 DIAGNOSIS — G309 Alzheimer's disease, unspecified: Secondary | ICD-10-CM | POA: Diagnosis not present

## 2016-03-26 DIAGNOSIS — I10 Essential (primary) hypertension: Secondary | ICD-10-CM | POA: Diagnosis not present

## 2016-03-26 DIAGNOSIS — F1010 Alcohol abuse, uncomplicated: Secondary | ICD-10-CM

## 2016-03-26 DIAGNOSIS — K4090 Unilateral inguinal hernia, without obstruction or gangrene, not specified as recurrent: Secondary | ICD-10-CM | POA: Diagnosis not present

## 2016-03-26 DIAGNOSIS — F0280 Dementia in other diseases classified elsewhere without behavioral disturbance: Secondary | ICD-10-CM

## 2016-03-26 NOTE — Progress Notes (Signed)
DATE: 03/26/16  Location:  [place] Room Number: [place] of Service: SNF (31)   Extended Emergency Contact Information Primary Emergency Contact: [place] Phone: [phone] Relation: Other  Advanced Directive information Does patient have an advance directive?: No, Would patient like information on creating an advanced directive?: No - patient declined information FULL CODE Chief Complaint  Patient presents with  . Medical Management of Chronic Issues    Routine Visit    HPI:  82 yo male seen today for f/u. He c/o that someone has been taking and drinking his beer that is kept in the refrigerator in medication room. He is a poor historian due to dementia. Hx obtained from chart.  Alzheimer's disease - stable on aricept 10 mg nightly. Gets nutritional supplements. Takes MVI daily  Hx Etoh Abuse - on MVI daily. Gets nutritional supplements  Hypertension/chest pain - BP controlled on toprol xl 25 mg daily; imdur 30 mg daily for his chest pain.   Anxiety with depression - stable on zoloft 25 mg daily   Insomnia - stable on ambein 5 mg nightly as needed   Prostate cancer -  Gleason score grade 7. currently not on treatment   Bilateral shoulder pain - stable on capsaicin 0.025% cream daily as needed for joint pain  right inguinal hernia - stable at present. No pain at this time but does have discomfort intermittently   Allergic rhinitis - improved on claritin 10 mg daily and flonase nightly       Past Medical History  Diagnosis Date  . Arthritis   . Cancer [facility])     prostate   . Stroke ([place])   . 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 Nasal congestion   . Hearing loss   . Difficulty urinating   . Weakness   . Dementia   . Diabetes mellitus without complication [facility])     Past Surgical History  Procedure Laterality Date  . Hernia repair  2012    Patient Care  Team: Gildardo Cranker, DO as PCP - General (Internal Medicine) Gerlene Fee, NP as Nurse Practitioner (Geriatric Medicine) [place]  Social History   Social History  . Marital Status: Widowed    Spouse Name: N/A  . Number of Children: N/A  . Years of Education: N/A   Occupational History  . Not on file.   Social History Main Topics  . Smoking status: Current Every Day Smoker  . Smokeless tobacco: Not on file  . Alcohol Use: Yes     Comment: daily  . Drug Use: No  . Sexual Activity: No   Other Topics Concern  . Not on file   Social History Narrative     reports that he has been smoking.  He does not have any smokeless tobacco history on file. He reports that he drinks alcohol. He reports that he does not use illicit drugs. Family History  Problem Relation Age of Onset  . Heart attack Father   . Diabetes Brother   . Hypertension Brother     Immunization History  Administered Date(s) Administered  . Influenza Whole 09/28/2010  . Influenza,inj,Quad PF,36+ Mos 07/16/2013, 07/26/2015    No Known Allergies  Medications: Patient's Medications  New Prescriptions   No medications on file  Previous Medications   CAPSAICIN (ZOSTRIX) 0.025 % CREAM  Apply 1 application topically daily as needed.   CARBAMIDE PEROXIDE (DEBROX) 6.5 % OTIC SOLUTION    Place 5 drops into both ears daily as needed (wax bld up).   DONEPEZIL (ARICEPT) 10 MG TABLET    Take 1 tablet (10 mg total) by mouth at bedtime.   FLUTICASONE (FLONASE) 50 MCG/ACT NASAL SPRAY    Place 2 sprays into both nostrils daily.   ISOSORBIDE MONONITRATE (IMDUR) 30 MG 24 HR TABLET    Take 1 tablet (30 mg total) by mouth daily.   LORATADINE (CLARITIN) 10 MG TABLET    Take 10 mg by mouth daily.   METOPROLOL SUCCINATE (TOPROL-XL) 25 MG 24 HR TABLET    Take 1 tablet (25 mg total) by mouth daily.   MULTIPLE VITAMINS-MINERALS (MULTIVITAMIN) TABLET    Take 1 tablet by mouth  daily.   NUTRITIONAL SUPPLEMENTS (NUTRITIONAL SUPPLEMENT PO)    Take 90 mLs by mouth 2 (two) times daily. Med Pass   SERTRALINE (ZOLOFT) 50 MG TABLET    Take 0.5 tablets (25 mg total) by mouth daily.   ZOLPIDEM (AMBIEN) 5 MG TABLET    Take 1 tablet (5 mg total) by mouth at bedtime as needed for sleep.  Modified Medications   No medications on file  Discontinued Medications   No medications on file    Review of Systems  Unable to perform ROS: Dementia    Filed Vitals:   03/26/16 1130  BP: 115/59  Pulse: 70  Temp: 98.1 F (36.7 C)  TempSrc: Oral  Resp: 18  Height: 5' (1.524 m)  Weight: 116 lb (52.617 kg)  SpO2: 98%   Body mass index is 22.65 kg/(m^2).  Physical Exam  Constitutional: He appears well-developed.  Sitting on bed in NAD. Frail appearing  HENT:  Mouth/Throat: Oropharynx is clear and moist.  Eyes: Pupils are equal, round, and reactive to light. No scleral icterus.  Neck: Neck supple. Carotid bruit is not present. No thyromegaly present.  Cardiovascular: Normal rate, regular rhythm and intact distal pulses.  Exam reveals no gallop and no friction rub.   Murmur (1/6 SEM) heard. no distal LE swelling. No calf TTP  Pulmonary/Chest: Effort normal and breath sounds normal. He has no wheezes. He has no rales. He exhibits no tenderness.  Abdominal: Soft. Bowel sounds are normal. He exhibits no distension, no abdominal bruit, no pulsatile midline mass and no mass. There is no tenderness. There is no rebound and no guarding.  Lymphadenopathy:    He has no cervical adenopathy.  Neurological: He is alert.  Skin: Skin is warm and dry. No rash noted.  Psychiatric: He has a normal mood and affect. He is agitated.     Labs reviewed: Nursing Home on 01/11/2016  Component Date Value Ref Range Status  . Hemoglobin 11/10/2015 12.9* 13.5 - 17.5 g/dL Final  . HCT 11/10/2015 38* 41 - 53 % Final  . Platelets 11/10/2015 272  150 - 399 K/L Final  . WBC 11/10/2015 5.5   Final  .  Glucose 11/10/2015 69   Final  . BUN 11/10/2015 16  4 - 21 mg/dL Final  . Creatinine 11/10/2015 0.8  0.6 - 1.3 mg/dL Final  . Potassium 11/10/2015 4.1  3.4 - 5.3 mmol/L Final  . Sodium 11/10/2015 140  137 - 147 mmol/L Final    No results found.   Assessment/Plan   ICD-9-CM ICD-10-CM   1. Alcohol abuse 305.00 F10.10   2. Alzheimer's dementia without behavioral disturbance, unspecified timing of dementia  onset 331.0 G30.9    294.10 F02.80   3. Essential hypertension, benign 401.1 I10   4. GAIT DISTURBANCE 781.2 R26.9   5. Right inguinal hernia 550.90 K40.90   6. H/O prostate cancer V10.46 Z85.46     Cont current meds as ordered  Etoh use cessation discussed and highly urged  PT/OT/ST as ordered  F/u with specialists as indicated  Nutritional supplements as ordered  Will follow  Estrella Alcaraz S. Perlie Gold  [facility] [address] [place], [place] [address] [phone] Cell (Monday-Friday 8 AM - 5 PM) [phone] After 5 PM and follow prompts

## 2016-03-27 LAB — BASIC METABOLIC PANEL
BUN: 19 mg/dL (ref 4–21)
Creatinine: 1 mg/dL (ref 0.6–1.3)
GLUCOSE: 106 mg/dL
Potassium: 4.5 mmol/L (ref 3.4–5.3)
Sodium: 141 mmol/L (ref 137–147)

## 2016-03-27 LAB — HEPATIC FUNCTION PANEL
ALT: 10 U/L (ref 10–40)
AST: 15 U/L (ref 14–40)
Alkaline Phosphatase: 57 U/L (ref 25–125)
BILIRUBIN, TOTAL: 0.5 mg/dL

## 2016-04-23 ENCOUNTER — Non-Acute Institutional Stay (SKILLED_NURSING_FACILITY): Payer: Medicare HMO | Admitting: Internal Medicine

## 2016-04-23 ENCOUNTER — Encounter: Payer: Self-pay | Admitting: Internal Medicine

## 2016-04-23 DIAGNOSIS — I10 Essential (primary) hypertension: Secondary | ICD-10-CM | POA: Diagnosis not present

## 2016-04-23 DIAGNOSIS — F0280 Dementia in other diseases classified elsewhere without behavioral disturbance: Secondary | ICD-10-CM | POA: Diagnosis not present

## 2016-04-23 DIAGNOSIS — R269 Unspecified abnormalities of gait and mobility: Secondary | ICD-10-CM | POA: Diagnosis not present

## 2016-04-23 DIAGNOSIS — F1010 Alcohol abuse, uncomplicated: Secondary | ICD-10-CM

## 2016-04-23 DIAGNOSIS — J302 Other seasonal allergic rhinitis: Secondary | ICD-10-CM

## 2016-04-23 DIAGNOSIS — C61 Malignant neoplasm of prostate: Secondary | ICD-10-CM

## 2016-04-23 DIAGNOSIS — G309 Alzheimer's disease, unspecified: Secondary | ICD-10-CM

## 2016-04-23 NOTE — Progress Notes (Signed)
DATE: 04/23/16  Location:    [place] Room Number: [place] of Service: SNF (31)   Extended Emergency Contact Information Primary Emergency Contact: [place] Mobile Phone: [phone] Relation: Other  Advanced Directive information Does patient have an advance directive?: No, Would patient like information on creating an advanced directive?: No - patient declined information FULL CODE  Chief Complaint  Patient presents with  . Medical Management of Chronic Issues    Routine Visit    HPI:  82 yo male long term resident seen today for f/u. He c/o 2 week hx cough and cold sx's with sinus pressure. No relief claritin, flonase or mucinex. No known sick contacts. He is a poor historian due to dementia. Hx obtained from chart  Alzheimer's disease - stable on aricept 10 mg nightly. Gets nutritional supplements. Takes MVI daily. Albumin 3.6  Hx Etoh Abuse - on MVI daily. Gets nutritional supplements  Hypertension/chest pain - BP controlled on toprol xl 25 mg daily; imdur 30 mg daily for his chest pain.   Anxiety with depression - stable on zoloft 25 mg daily   Insomnia - stable on ambein 5 mg nightly as needed   Prostate cancer -  Gleason score grade 7. currently not on treatment. Followed by urology   Bilateral shoulder pain - stable on capsaicin 0.025% cream daily as needed for joint pain  right inguinal hernia - stable at present. No pain at this time but does have discomfort intermittently   Allergic rhinitis - takes claritin 10 mg daily,mucinex q12hr and flonase nightly    Past Medical History  Diagnosis Date  . Arthritis   . Cancer [facility])     prostate   . Stroke ([place])   . 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 Nasal congestion   . Hearing loss   . Difficulty urinating   . Weakness   . Dementia   . Diabetes mellitus without complication [facility])     Past Surgical History  Procedure Laterality  Date  . Hernia repair  2012    Patient Care Team: Gildardo Cranker, DO as PCP - General (Internal Medicine) Gerlene Fee, NP as Nurse Practitioner (Geriatric Medicine) [place] [facility]  Social History   Social History  . Marital Status: Widowed    Spouse Name: N/A  . Number of Children: N/A  . Years of Education: N/A   Occupational History  . Not on file.   Social History Main Topics  . Smoking status: Current Every Day Smoker  . Smokeless tobacco: Not on file  . Alcohol Use: Yes     Comment: daily  . Drug Use: No  . Sexual Activity: No   Other Topics Concern  . Not on file   Social History Narrative     reports that he has been smoking.  He does not have any smokeless tobacco history on file. He reports that he drinks alcohol. He reports that he does not use illicit drugs.  Family History  Problem Relation Age of Onset  . Heart attack Father   . Diabetes Brother   . Hypertension Brother    Family Status  Relation Status Death Age  . Mother Deceased   . Father Deceased     Immunization History  Administered Date(s) Administered  . Influenza Whole 09/28/2010  . Influenza,inj,Quad PF,36+ Mos 07/16/2013, 07/26/2015    No Known Allergies  Medications: Patient's Medications  New Prescriptions   No medications on file  Previous Medications   CAPSAICIN (ZOSTRIX) 0.025 % CREAM    Apply 1 application topically daily as needed.   CARBAMIDE PEROXIDE (DEBROX) 6.5 % OTIC SOLUTION    Place 5 drops into both ears daily as needed (wax bld up).   DONEPEZIL (ARICEPT) 10 MG TABLET    Take 1 tablet (10 mg total) by mouth at bedtime.   FLUTICASONE (FLONASE) 50 MCG/ACT NASAL SPRAY    Place 2 sprays into both nostrils daily.   ISOSORBIDE MONONITRATE (IMDUR) 30 MG 24 HR TABLET    Take 1 tablet (30 mg total) by mouth daily.   LORATADINE (CLARITIN) 10 MG TABLET    Take 10 mg by mouth daily.   METOPROLOL SUCCINATE (TOPROL-XL) 25 MG 24 HR  TABLET    Take 1 tablet (25 mg total) by mouth daily.   MULTIPLE VITAMINS-MINERALS (MULTIVITAMIN) TABLET    Take 1 tablet by mouth daily.   NUTRITIONAL SUPPLEMENTS (NUTRITIONAL SUPPLEMENT PO)    Take 90 mLs by mouth 2 (two) times daily. Med Pass   SERTRALINE (ZOLOFT) 50 MG TABLET    Take 0.5 tablets (25 mg total) by mouth daily.   ZOLPIDEM (AMBIEN) 5 MG TABLET    Take 1 tablet (5 mg total) by mouth at bedtime as needed for sleep.  Modified Medications   No medications on file  Discontinued Medications   No medications on file    Review of Systems  Unable to perform ROS: Dementia    Filed Vitals:   04/23/16 1123  BP: 106/44  Pulse: 63  Temp: 97.5 F (36.4 C)  TempSrc: Oral  Resp: 16  Height: 5' (1.524 m)  Weight: 114 lb 9.6 oz (51.982 kg)   Body mass index is 22.38 kg/(m^2).  Physical Exam  Constitutional: He appears well-developed.  Sitting on bed in NAD. Frail appearing  HENT:  Mouth/Throat: Oropharynx is clear and moist.  Maxillary sinus TTP L>R with boggy tissue texture changes  Eyes: Pupils are equal, round, and reactive to light. No scleral icterus.  Neck: Neck supple. Carotid bruit is not present. No thyromegaly present.  Cardiovascular: Normal rate, regular rhythm and intact distal pulses.  Exam reveals no gallop and no friction rub.   Murmur (1/6 SEM) heard. no distal LE swelling. No calf TTP  Pulmonary/Chest: Effort normal and breath sounds normal. He has no wheezes. He has no rales. He exhibits no tenderness.  Abdominal: Soft. Bowel sounds are normal. He exhibits no distension, no abdominal bruit, no pulsatile midline mass and no mass. There is no tenderness. There is no rebound and no guarding.  Lymphadenopathy:    He has no cervical adenopathy.  Neurological: He is alert.  Skin: Skin is warm and dry. No rash noted.  Psychiatric: He has a normal mood and affect. He is agitated.     Labs reviewed: Nursing Home on 04/23/2016  Component Date Value Ref Range  Status  . Glucose 03/27/2016 106   Final  . BUN 03/27/2016 19  4 - 21 mg/dL Final  . Creatinine 03/27/2016 1.0  0.6 - 1.3 mg/dL Final  . Potassium 03/27/2016 4.5  3.4 - 5.3 mmol/L Final  . Sodium 03/27/2016 141  137 - 147 mmol/L Final  . Alkaline Phosphatase 03/27/2016 57  25 - 125 U/L Final  . ALT 03/27/2016 10  10 - 40 U/L Final  . AST 03/27/2016 15  14 - 40 U/L Final  . Bilirubin,  Total 03/27/2016 0.5   Final    No results found.   Assessment/Plan   ICD-9-CM ICD-10-CM   1. Other seasonal allergic rhinitis - failing to change as expected 477.8 J30.2   2. Alzheimer's dementia without behavioral disturbance, unspecified timing of dementia onset 331.0 G30.9    294.10 F02.80   3. Alcohol abuse - ongoing 305.00 F10.10   4. Essential hypertension, benign 401.1 I10   5. GAIT DISTURBANCE 781.2 R26.9   6. ADENOCARCINOMA, PROSTATE, GLEASON GRADE 7 185 C61      Start singulair 10mg  qhs for allergic rhinitis  Cont other meds as ordered  PT/OT/ST as indicated  F/u with specialists as scheduled  Fall precautions  Will follow  Nikala Walsworth S. Perlie Gold  [facility] [address] [place], [place] [address] [phone] Cell (Monday-Friday 8 AM - 5 PM) [phone] After 5 PM and follow prompts

## 2016-05-23 ENCOUNTER — Encounter: Payer: Self-pay | Admitting: Adult Health

## 2016-05-23 ENCOUNTER — Non-Acute Institutional Stay: Payer: Medicare HMO | Admitting: Adult Health

## 2016-05-23 DIAGNOSIS — G301 Alzheimer's disease with late onset: Secondary | ICD-10-CM | POA: Diagnosis not present

## 2016-05-23 DIAGNOSIS — F418 Other specified anxiety disorders: Secondary | ICD-10-CM

## 2016-05-23 DIAGNOSIS — F0280 Dementia in other diseases classified elsewhere without behavioral disturbance: Secondary | ICD-10-CM

## 2016-05-23 DIAGNOSIS — C61 Malignant neoplasm of prostate: Secondary | ICD-10-CM | POA: Diagnosis not present

## 2016-05-23 DIAGNOSIS — M25512 Pain in left shoulder: Secondary | ICD-10-CM

## 2016-05-23 DIAGNOSIS — R1030 Lower abdominal pain, unspecified: Secondary | ICD-10-CM | POA: Diagnosis not present

## 2016-05-23 DIAGNOSIS — J302 Other seasonal allergic rhinitis: Secondary | ICD-10-CM | POA: Diagnosis not present

## 2016-05-23 DIAGNOSIS — I10 Essential (primary) hypertension: Secondary | ICD-10-CM | POA: Diagnosis not present

## 2016-05-23 DIAGNOSIS — M25511 Pain in right shoulder: Secondary | ICD-10-CM

## 2016-05-23 NOTE — Progress Notes (Signed)
Patient ID: [name] Rice, male   DOB: 1924-12-09, 82 y.o.   MRN: [id number]    Location:   [place] Room Number: 157-B Place of Service:  SNF (31)   CODE STATUS: Full Code  No Known Allergies  Chief Complaint  Patient presents with  . Medical Management of Chronic Issues    Follow up    HPI:  He is a long term resident of this facility being seen for the management of his chronic illnesses. Overall his status is stable. He is not voicing any concerns or complaints a this time. There are no nursing concerns at this time.     Past Medical History:  Diagnosis Date  . Arthritis   . Cancer [facility])    prostate   . Dementia   . Diabetes mellitus without complication ([place])   . Difficulty urinating   . Hearing loss   . Nasal congestion   . Stroke ([place])   . 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 Weakness     Past Surgical History:  Procedure Laterality Date  . HERNIA REPAIR  2012    Social History   Social History  . Marital status: Widowed    Spouse name: N/A  . Number of children: N/A  . Years of education: N/A   Occupational History  . Not on file.   Social History Main Topics  . Smoking status: Current Every Day Smoker  . Smokeless tobacco: Not on file  . Alcohol use Yes     Comment: daily  . Drug use: No  . Sexual activity: No   Other Topics Concern  . Not on file   Social History Narrative  . No narrative on file   Family History  Problem Relation Age of Onset  . Heart attack Father   . Diabetes Brother   . Hypertension Brother       VITAL SIGNS BP 133/63   Pulse 74   Temp 98 F (36.7 C) (Oral)   Resp 16   Ht 5' (1.524 m)   Wt 114 lb (51.7 kg)   SpO2 96%   BMI 22.26 kg/m   Patient's Medications  New Prescriptions   No medications on file  Previous Medications   CAPSAICIN (ZOSTRIX) 0.025 % CREAM    Apply 1 application topically daily as needed.   CARBAMIDE PEROXIDE (DEBROX) 6.5 % OTIC SOLUTION    Place 5  drops into both ears daily as needed (wax bld up).   DONEPEZIL (ARICEPT) 10 MG TABLET    Take 1 tablet (10 mg total) by mouth at bedtime.   FLUTICASONE (FLONASE) 50 MCG/ACT NASAL SPRAY    Place 2 sprays into both nostrils daily.   ISOSORBIDE MONONITRATE (IMDUR) 30 MG 24 HR TABLET    Take 1 tablet (30 mg total) by mouth daily.   LORATADINE (CLARITIN) 10 MG TABLET    Take 10 mg by mouth daily.   METOPROLOL SUCCINATE (TOPROL-XL) 25 MG 24 HR TABLET    Take 1 tablet (25 mg total) by mouth daily.   MONTELUKAST (SINGULAIR) 10 MG TABLET    Take 10 mg by mouth at bedtime.   MULTIPLE VITAMINS-MINERALS (MULTIVITAMIN) TABLET    Take 1 tablet by mouth daily.   NUTRITIONAL SUPPLEMENTS (NUTRITIONAL SUPPLEMENT PO)    Take 90 mLs by mouth 2 (two) times daily. Med Pass   SERTRALINE (ZOLOFT) 50 MG TABLET    Take 0.5 tablets (25 mg total)  by mouth daily.   TRAZODONE (DESYREL) 25 MG TABS TABLET    Take 25 mg by mouth at bedtime.  Modified Medications   No medications on file  Discontinued Medications   GUAIFENESIN (MUCINEX) 600 MG 12 HR TABLET    Take 600 mg by mouth 2 (two) times daily.   ZOLPIDEM (AMBIEN) 5 MG TABLET    Take 1 tablet (5 mg total) by mouth at bedtime as needed for sleep.     SIGNIFICANT DIAGNOSTIC EXAMS  10-29-15: chest x-ray: No acute abnormality. Large hiatal hernia. Old compression fractures.  11-02-15: ct of abdomen and pelvis: 1. No acute abnormality in the abdomen/pelvis. 2. Resolution of previous apparent wall thickening involving the colon. 3. Diffuse bladder wall thickening with bladder diverticula consistent with chronic outlet obstruction. Additional chronic findings are stable.  11-06-15: acute abdomen x-ray: No bowel obstruction or free air. Retrocardiac hiatal hernia. Right nephrolithiasis again seen.   LABS REVIEWED:   10-29-15: wbc 5.5; hgb 13.1; hct 38.5; mcv 98.2; plt 245; glucose 109; bun 25; creat 0.82; k+ 3.1; na++146; ;liver normal albumin 4.0; mag 1.9; phos 2.7 10-30-15;  c-diff: neg 11-06-15: wbc 6.4; hgb 14.2; hct 41.7; mcv 98.6; plt 270; glucose 72; bun 15; creat 0.84; k+ 3.8; na++145; liver normal albumin 3.6; lipase 33.  11-10-15: wbc 5.5; hgb 12.9; hct 37.6; mcv 97.4; plt 272; glucose 69; bun 16; creat 0.81; k+ 4.1; na++140  03-27-16: glucose 106; bun 19; creat 0.96; k+ 4.5; na++ 141; liver normal albumin 3.6    Review of Systems  Constitutional: Negative for malaise/fatigue.  Respiratory: Negative for  shortness of breath.   Cardiovascular: Negative for chest pain, palpitations and leg swelling.  Gastrointestinal: Negative for heartburn, nausea, abdominal pain, diarrhea and constipation.  Musculoskeletal: Negative for myalgias and joint pain.  Skin: Negative.   Neurological: Negative for headaches.  Psychiatric/Behavioral: Negative for depression. The patient is not nervous/anxious.      Physical Exam  Constitutional: No distress.  Frail   Eyes: Conjunctivae are normal.  Neck: Neck supple. No JVD present. No thyromegaly present.  Cardiovascular: Normal rate, regular rhythm and intact distal pulses.   Respiratory: Effort normal and breath sounds normal. No respiratory distress. He has no wheezes.  GI: Soft. Bowel sounds are normal. He exhibits no distension. There is no tenderness.  Musculoskeletal: He exhibits no edema.  Able to move all extremities   Lymphadenopathy:    He has no cervical adenopathy.  Neurological: He is alert.  Skin: Skin is warm and dry. He is not diaphoretic.  Psychiatric: He has a normal mood and affect.      ASSESSMENT/ PLAN:   1. Alzheimer's disease: no significant change in his status; will continue his aricept 10 mg nightly and will monitor  His current weight is 114 pounds.   2. Hypertension: will continue toprol xl 25 mg daily and will continue imdur 30 mg daily for his chest pain.   3. Anxiety with depression: will continue zoloft 25 mg daily is taking trazodone 25 mg nightly for sleep  4. Prostate cancer:  gleason grade 7: is currently not on treatment will monitor    5. Bilateral shoulder pain: will continue capsaicin 0.025% cream daily as needed for joint pain  6. Lower abdominal pain: does have a right inguinal hernia; is presently not having pain will continue to monitor his status   7. Allergic rhinitis: will continue  claritin 10 mg daily and singlair 10 mg daily     Ok Edwards  NP [facility]  Contact [phone] Monday through Friday 8am- 5pm  After hours call [phone]

## 2016-06-03 DIAGNOSIS — F418 Other specified anxiety disorders: Secondary | ICD-10-CM | POA: Insufficient documentation

## 2016-06-27 ENCOUNTER — Encounter: Payer: Self-pay | Admitting: Adult Health

## 2016-06-27 ENCOUNTER — Non-Acute Institutional Stay (SKILLED_NURSING_FACILITY): Payer: Medicare HMO | Admitting: Adult Health

## 2016-06-27 DIAGNOSIS — F418 Other specified anxiety disorders: Secondary | ICD-10-CM | POA: Diagnosis not present

## 2016-06-27 DIAGNOSIS — J302 Other seasonal allergic rhinitis: Secondary | ICD-10-CM | POA: Diagnosis not present

## 2016-06-27 DIAGNOSIS — M25512 Pain in left shoulder: Secondary | ICD-10-CM

## 2016-06-27 DIAGNOSIS — F0280 Dementia in other diseases classified elsewhere without behavioral disturbance: Secondary | ICD-10-CM | POA: Diagnosis not present

## 2016-06-27 DIAGNOSIS — I10 Essential (primary) hypertension: Secondary | ICD-10-CM | POA: Diagnosis not present

## 2016-06-27 DIAGNOSIS — M25511 Pain in right shoulder: Secondary | ICD-10-CM

## 2016-06-27 DIAGNOSIS — C61 Malignant neoplasm of prostate: Secondary | ICD-10-CM

## 2016-06-27 DIAGNOSIS — R1084 Generalized abdominal pain: Secondary | ICD-10-CM

## 2016-06-27 DIAGNOSIS — G301 Alzheimer's disease with late onset: Secondary | ICD-10-CM | POA: Diagnosis not present

## 2016-06-27 NOTE — Progress Notes (Signed)
Patient ID: [name] Rice, male   DOB: 03/23/1925, 82 y.o.   MRN: [id number]   Location:   [place] Room Number: 157-B Place of Service:  SNF (31)   CODE STATUS: Full Code  No Known Allergies  Chief Complaint  Patient presents with  . Medical Management of Chronic Issues    Follow up    HPI:  He is a long term resident of this facility being for the management of his chronic illnesses. There is little change in his status. He is not voicing any complaints; states that he is feeling good. He is friendly with everyone around him. There are no nursing concerns at this time.    Past Medical History:  Diagnosis Date  . Arthritis   . Cancer [facility])    prostate   . Dementia   . Diabetes mellitus without complication ([place])   . Difficulty urinating   . Hearing loss   . Nasal congestion   . Stroke ([place])   . 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 Weakness     Past Surgical History:  Procedure Laterality Date  . HERNIA REPAIR  2012    Social History   Social History  . Marital status: Widowed    Spouse name: N/A  . Number of children: N/A  . Years of education: N/A   Occupational History  . Not on file.   Social History Main Topics  . Smoking status: Current Every Day Smoker  . Smokeless tobacco: Not on file  . Alcohol use Yes     Comment: daily  . Drug use: No  . Sexual activity: No   Other Topics Concern  . Not on file   Social History Narrative  . No narrative on file   Family History  Problem Relation Age of Onset  . Heart attack Father   . Diabetes Brother   . Hypertension Brother       VITAL SIGNS BP 112/66   Pulse 70   Temp 97.5 F (36.4 C) (Oral)   Resp 16   Ht 5' (1.524 m)   Wt 114 lb (51.7 kg)   SpO2 98%   BMI 22.26 kg/m   Patient's Medications  New Prescriptions   No medications on file  Previous Medications   CAPSAICIN (ZOSTRIX) 0.025 % CREAM    Apply 1 application topically daily as needed.   CARBAMIDE PEROXIDE (DEBROX) 6.5 % OTIC SOLUTION    Place 5 drops into both ears daily as needed (wax bld up).   DONEPEZIL (ARICEPT) 10 MG TABLET    Take 1 tablet (10 mg total) by mouth at bedtime.   FLUTICASONE (FLONASE) 50 MCG/ACT NASAL SPRAY    Place 2 sprays into both nostrils daily.   ISOSORBIDE MONONITRATE (IMDUR) 30 MG 24 HR TABLET    Take 1 tablet (30 mg total) by mouth daily.   LORATADINE (CLARITIN) 10 MG TABLET    Take 10 mg by mouth daily.   MEMANTINE (NAMENDA XR) 14 MG CP24 24 HR CAPSULE    Take 14 mg by mouth daily.   METOPROLOL SUCCINATE (TOPROL-XL) 25 MG 24 HR TABLET    Take 1 tablet (25 mg total) by mouth daily.   MONTELUKAST (SINGULAIR) 10 MG TABLET    Take 10 mg by mouth at bedtime.   MULTIPLE VITAMINS-MINERALS (MULTIVITAMIN) TABLET    Take 1 tablet by mouth daily.   NUTRITIONAL SUPPLEMENTS (NUTRITIONAL SUPPLEMENT PO)    Take  90 mLs by mouth 2 (two) times daily. Med Pass   QUETIAPINE (SEROQUEL) 25 MG TABLET    Take 37.5 mg by mouth at bedtime.   SERTRALINE (ZOLOFT) 50 MG TABLET    Take 0.5 tablets (25 mg total) by mouth daily.  Modified Medications   No medications on file  Discontinued Medications   TRAZODONE (DESYREL) 25 MG TABS TABLET    Take 25 mg by mouth at bedtime.     SIGNIFICANT DIAGNOSTIC EXAMS  10-29-15: chest x-ray: No acute abnormality. Large hiatal hernia. Old compression fractures.  11-02-15: ct of abdomen and pelvis: 1. No acute abnormality in the abdomen/pelvis. 2. Resolution of previous apparent wall thickening involving the colon. 3. Diffuse bladder wall thickening with bladder diverticula consistent with chronic outlet obstruction. Additional chronic findings are stable.  11-06-15: acute abdomen x-ray: No bowel obstruction or free air. Retrocardiac hiatal hernia. Right nephrolithiasis again seen.   LABS REVIEWED:   10-29-15: wbc 5.5; hgb 13.1; hct 38.5; mcv 98.2; plt 245; glucose 109; bun 25; creat 0.82; k+ 3.1; na++146; ;liver normal albumin 4.0; mag  1.9; phos 2.7 10-30-15; c-diff: neg 11-06-15: wbc 6.4; hgb 14.2; hct 41.7; mcv 98.6; plt 270; glucose 72; bun 15; creat 0.84; k+ 3.8; na++145; liver normal albumin 3.6; lipase 33.  11-10-15: wbc 5.5; hgb 12.9; hct 37.6; mcv 97.4; plt 272; glucose 69; bun 16; creat 0.81; k+ 4.1; na++140  03-27-16: glucose 106; bun 19; creat 0.96; k+ 4.5; na++ 141; liver normal albumin 3.6    Review of Systems  Constitutional: Negative for malaise/fatigue.  Respiratory: Negative for  shortness of breath.   Cardiovascular: Negative for chest pain, palpitations and leg swelling.  Gastrointestinal: Negative for heartburn, nausea, abdominal pain, diarrhea and constipation.  Musculoskeletal: Negative for myalgias and joint pain.  Skin: Negative.   Neurological: Negative for headaches.  Psychiatric/Behavioral: Negative for depression. The patient is not nervous/anxious.      Physical Exam  Constitutional: No distress.  Frail   Eyes: Conjunctivae are normal.  Neck: Neck supple. No JVD present. No thyromegaly present.  Cardiovascular: Normal rate, regular rhythm and intact distal pulses.   Respiratory: Effort normal and breath sounds normal. No respiratory distress. He has no wheezes.  GI: Soft. Bowel sounds are normal. He exhibits no distension. There is no tenderness.  Musculoskeletal: He exhibits no edema.  Able to move all extremities   Lymphadenopathy:    He has no cervical adenopathy.  Neurological: He is alert.  Skin: Skin is warm and dry. He is not diaphoretic.  Psychiatric: He has a normal mood and affect.      ASSESSMENT/ PLAN:  1. Alzheimer's disease: no significant change in his status; will continue his aricept 10 mg nightly and namenda xr 14 mg daily and will monitor  His current weight is 114 pounds.   2. Hypertension: will continue toprol xl 25 mg daily and will continue imdur 30 mg daily for his chest pain.   3. Anxiety with depression: will continue zoloft 25 mg daily   4. Prostate  cancer: gleason grade 7: is currently not on treatment will monitor    5. Bilateral shoulder pain: will continue capsaicin 0.025% cream daily as needed for joint pain  6. Lower abdominal pain: does have a right inguinal hernia; is presently not having pain will continue to monitor his status   7. Allergic rhinitis: will continue  claritin 10 mg daily  singlair 10 mg daily  flonase daily   8. Psychosis: is stable will  continue seroquel 37.5 mg nightly is followed by psych services    Ok Edwards NP [facility]  Contact [phone] Monday through Friday 8am- 5pm  After hours call [phone]

## 2016-07-25 ENCOUNTER — Non-Acute Institutional Stay (SKILLED_NURSING_FACILITY): Payer: Medicare HMO | Admitting: Adult Health

## 2016-07-25 ENCOUNTER — Encounter: Payer: Self-pay | Admitting: Adult Health

## 2016-07-25 DIAGNOSIS — I10 Essential (primary) hypertension: Secondary | ICD-10-CM | POA: Diagnosis not present

## 2016-07-25 DIAGNOSIS — F0280 Dementia in other diseases classified elsewhere without behavioral disturbance: Secondary | ICD-10-CM | POA: Diagnosis not present

## 2016-07-25 DIAGNOSIS — J310 Chronic rhinitis: Secondary | ICD-10-CM | POA: Diagnosis not present

## 2016-07-25 DIAGNOSIS — R1030 Lower abdominal pain, unspecified: Secondary | ICD-10-CM

## 2016-07-25 DIAGNOSIS — G301 Alzheimer's disease with late onset: Secondary | ICD-10-CM

## 2016-07-25 DIAGNOSIS — C61 Malignant neoplasm of prostate: Secondary | ICD-10-CM | POA: Diagnosis not present

## 2016-07-25 DIAGNOSIS — J302 Other seasonal allergic rhinitis: Secondary | ICD-10-CM

## 2016-07-25 NOTE — Progress Notes (Signed)
Patient ID: [name] Rice, male   DOB: 1925-05-29, 82 y.o.   MRN: [id number]    Location:   [place] Room Number: 157-B Place of Service:  SNF (31)   CODE STATUS: Full Code  No Known Allergies  Chief Complaint  Patient presents with  . Medical Management of Chronic Issues    Follow up    HPI:  He is a long term resident of this facility being seen for the management of his chronic illnesses. Overall his status is stable. He is out and about on a daily basis. He tells me that he is feeling good and has no complaints. There are no nursing concerns at this time.    Past Medical History:  Diagnosis Date  . Arthritis   . Cancer [facility])    prostate   . Dementia   . Diabetes mellitus without complication ([place])   . Difficulty urinating   . Hearing loss   . Nasal congestion   . Stroke ([place])   . 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 Weakness     Past Surgical History:  Procedure Laterality Date  . HERNIA REPAIR  2012    Social History   Social History  . Marital status: Widowed    Spouse name: N/A  . Number of children: N/A  . Years of education: N/A   Occupational History  . Not on file.   Social History Main Topics  . Smoking status: Current Every Day Smoker  . Smokeless tobacco: Not on file  . Alcohol use Yes     Comment: daily  . Drug use: No  . Sexual activity: No   Other Topics Concern  . Not on file   Social History Narrative  . No narrative on file   Family History  Problem Relation Age of Onset  . Heart attack Father   . Diabetes Brother   . Hypertension Brother       VITAL SIGNS BP 112/60   Pulse 77   Temp 97.6 F (36.4 C) (Oral)   Resp 17   Ht 5' (1.524 m)   Wt 117 lb (53.1 kg)   SpO2 98%   BMI 22.85 kg/m   Patient's Medications  New Prescriptions   No medications on file  Previous Medications   CAPSAICIN (ZOSTRIX) 0.025 % CREAM    Apply 1 application topically daily as needed.   CARBAMIDE PEROXIDE  (DEBROX) 6.5 % OTIC SOLUTION    Place 5 drops into both ears daily as needed (wax bld up).   DONEPEZIL (ARICEPT) 10 MG TABLET    Take 1 tablet (10 mg total) by mouth at bedtime.   FLUTICASONE (FLONASE) 50 MCG/ACT NASAL SPRAY    Place 2 sprays into both nostrils daily.   ISOSORBIDE MONONITRATE (IMDUR) 30 MG 24 HR TABLET    Take 1 tablet (30 mg total) by mouth daily.   LORATADINE (CLARITIN) 10 MG TABLET    Take 10 mg by mouth daily.   MEMANTINE (NAMENDA XR) 14 MG CP24 24 HR CAPSULE    Take 14 mg by mouth daily.   METOPROLOL SUCCINATE (TOPROL-XL) 25 MG 24 HR TABLET    Take 1 tablet (25 mg total) by mouth daily.   MONTELUKAST (SINGULAIR) 10 MG TABLET    Take 10 mg by mouth at bedtime.   MULTIPLE VITAMINS-MINERALS (MULTIVITAMIN) TABLET    Take 1 tablet by mouth daily.   NUTRITIONAL SUPPLEMENTS (NUTRITIONAL SUPPLEMENT PO)  Take 90 mLs by mouth 2 (two) times daily. Med Pass   QUETIAPINE (SEROQUEL) 25 MG TABLET    Take 37.5 mg by mouth at bedtime.   SERTRALINE (ZOLOFT) 50 MG TABLET    Take 0.5 tablets (25 mg total) by mouth daily.  Modified Medications   No medications on file  Discontinued Medications   MEMANTINE (NAMENDA XR) 7 MG CP24 24 HR CAPSULE    Take 7 mg by mouth daily.     SIGNIFICANT DIAGNOSTIC EXAMS  10-29-15: chest x-ray: No acute abnormality. Large hiatal hernia. Old compression fractures.  11-02-15: ct of abdomen and pelvis: 1. No acute abnormality in the abdomen/pelvis. 2. Resolution of previous apparent wall thickening involving the colon. 3. Diffuse bladder wall thickening with bladder diverticula consistent with chronic outlet obstruction. Additional chronic findings are stable.  11-06-15: acute abdomen x-ray: No bowel obstruction or free air. Retrocardiac hiatal hernia. Right nephrolithiasis again seen.   LABS REVIEWED:   10-29-15: wbc 5.5; hgb 13.1; hct 38.5; mcv 98.2; plt 245; glucose 109; bun 25; creat 0.82; k+ 3.1; na++146; ;liver normal albumin 4.0; mag 1.9; phos  2.7 10-30-15; c-diff: neg 11-06-15: wbc 6.4; hgb 14.2; hct 41.7; mcv 98.6; plt 270; glucose 72; bun 15; creat 0.84; k+ 3.8; na++145; liver normal albumin 3.6; lipase 33.  11-10-15: wbc 5.5; hgb 12.9; hct 37.6; mcv 97.4; plt 272; glucose 69; bun 16; creat 0.81; k+ 4.1; na++140  03-27-16: glucose 106; bun 19; creat 0.96; k+ 4.5; na++ 141; liver normal albumin 3.6    Review of Systems  Constitutional: Negative for malaise/fatigue.  Respiratory: Negative for  shortness of breath.   Cardiovascular: Negative for chest pain, palpitations and leg swelling.  Gastrointestinal: Negative for heartburn, nausea, abdominal pain, diarrhea and constipation.  Musculoskeletal: Negative for myalgias and joint pain.  Skin: Negative.   Neurological: Negative for headaches.  Psychiatric/Behavioral: Negative for depression. The patient is not nervous/anxious.      Physical Exam  Constitutional: No distress.  Frail   Eyes: Conjunctivae are normal.  Neck: Neck supple. No JVD present. No thyromegaly present.  Cardiovascular: Normal rate, regular rhythm and intact distal pulses.   Respiratory: Effort normal and breath sounds normal. No respiratory distress. He has no wheezes.  GI: Soft. Bowel sounds are normal. He exhibits no distension. There is no tenderness.  Musculoskeletal: He exhibits no edema.  Able to move all extremities   Lymphadenopathy:    He has no cervical adenopathy.  Neurological: He is alert.  Skin: Skin is warm and dry. He is not diaphoretic.  Psychiatric: He has a normal mood and affect.      ASSESSMENT/ PLAN:  1. Alzheimer's disease: no significant change in his status; will continue his aricept 10 mg nightly and namenda xr 14 mg daily and will monitor  His current weight is 117 pounds.   2. Hypertension: will continue toprol xl 25 mg daily and will continue imdur 30 mg daily for his chest pain.   3. Anxiety with depression: will continue zoloft 25 mg daily   4. Prostate cancer:  gleason grade 7: is currently not on treatment will monitor    5. Bilateral shoulder pain: will continue capsaicin 0.025% cream daily as needed for joint pain  6. Lower abdominal pain: does have a right inguinal hernia; is presently not having pain will continue to monitor his status   7. Allergic rhinitis: will continue  claritin 10 mg daily  singlair 10 mg daily  flonase daily   8. Psychosis:  is stable will continue seroquel 37.5 mg nightly is followed by psych services   Will check cbc; cmp   Ok Edwards NP [facility]  Contact [phone] Monday through Friday 8am- 5pm  After hours call [phone]

## 2016-07-26 LAB — HEPATIC FUNCTION PANEL
ALT: 7 U/L — AB (ref 10–40)
AST: 15 U/L (ref 14–40)
Alkaline Phosphatase: 50 U/L (ref 25–125)
Bilirubin, Total: 0.3 mg/dL

## 2016-07-26 LAB — CBC AND DIFFERENTIAL
HEMATOCRIT: 38 % — AB (ref 41–53)
Hemoglobin: 12.8 g/dL — AB (ref 13.5–17.5)
Neutrophils Absolute: 2576 /uL
Platelets: 244 10*3/uL (ref 150–399)
WBC: 5.6 10^3/mL

## 2016-07-26 LAB — BASIC METABOLIC PANEL
BUN: 15 mg/dL (ref 4–21)
Creatinine: 1.1 mg/dL (ref 0.6–1.3)
GLUCOSE: 84 mg/dL
POTASSIUM: 4.2 mmol/L (ref 3.4–5.3)
SODIUM: 140 mmol/L (ref 137–147)

## 2016-08-26 ENCOUNTER — Encounter: Payer: Self-pay | Admitting: Adult Health

## 2016-08-26 ENCOUNTER — Non-Acute Institutional Stay (SKILLED_NURSING_FACILITY): Payer: Medicare HMO | Admitting: Adult Health

## 2016-08-26 DIAGNOSIS — G301 Alzheimer's disease with late onset: Secondary | ICD-10-CM

## 2016-08-26 DIAGNOSIS — C61 Malignant neoplasm of prostate: Secondary | ICD-10-CM

## 2016-08-26 DIAGNOSIS — F0280 Dementia in other diseases classified elsewhere without behavioral disturbance: Secondary | ICD-10-CM | POA: Diagnosis not present

## 2016-08-26 DIAGNOSIS — I10 Essential (primary) hypertension: Secondary | ICD-10-CM | POA: Diagnosis not present

## 2016-08-26 DIAGNOSIS — J302 Other seasonal allergic rhinitis: Secondary | ICD-10-CM | POA: Diagnosis not present

## 2016-08-26 DIAGNOSIS — F418 Other specified anxiety disorders: Secondary | ICD-10-CM | POA: Diagnosis not present

## 2016-08-26 NOTE — Progress Notes (Addendum)
Patient ID: [name] Rice, male   DOB: August 29, 1925, 82 y.o.   MRN: [id number]    Location:   [place] Room Number: 157-B Place of Service:  SNF (31)   CODE STATUS: Full Code  No Known Allergies  Chief Complaint  Patient presents with  . Medical Management of Chronic Issues    Follow up    HPI:  He is a long term resident of this facility being seen for the management of his chronic illnesses. Overall his status is stable. He is out of bed on a daily basis. He odes socialize  with others.  He tells me that he is feelig good and has no complaints. There are no nursing concerns at this time.   Past Medical History:  Diagnosis Date  . Arthritis   . Cancer [facility])    prostate   . Dementia   . Diabetes mellitus without complication ([place])   . Difficulty urinating   . Hearing loss   . Nasal congestion   . Stroke ([place])   . 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 Weakness     Past Surgical History:  Procedure Laterality Date  . HERNIA REPAIR  2012    Social History   Social History  . Marital status: Widowed    Spouse name: N/A  . Number of children: N/A  . Years of education: N/A   Occupational History  . Not on file.   Social History Main Topics  . Smoking status: Current Every Day Smoker  . Smokeless tobacco: Not on file  . Alcohol use Yes     Comment: daily  . Drug use: No  . Sexual activity: No   Other Topics Concern  . Not on file   Social History Narrative  . No narrative on file   Family History  Problem Relation Age of Onset  . Heart attack Father   . Diabetes Brother   . Hypertension Brother       VITAL SIGNS BP (!) 107/48   Pulse 97   Temp 97.6 F (36.4 C) (Oral)   Resp 18   Ht 5' (1.524 m)   Wt 114 lb (51.7 kg)   SpO2 97%   BMI 22.26 kg/m   Patient's Medications  New Prescriptions   No medications on file  Previous Medications   CAPSAICIN (ZOSTRIX) 0.025 % CREAM    Apply 1 application topically daily as  needed.   CARBAMIDE PEROXIDE (DEBROX) 6.5 % OTIC SOLUTION    Place 5 drops into both ears daily as needed (wax bld up).   DONEPEZIL (ARICEPT) 10 MG TABLET    Take 1 tablet (10 mg total) by mouth at bedtime.   FLUTICASONE (FLONASE) 50 MCG/ACT NASAL SPRAY    Place 2 sprays into both nostrils daily.   ISOSORBIDE MONONITRATE (IMDUR) 30 MG 24 HR TABLET    Take 1 tablet (30 mg total) by mouth daily.   LORATADINE (CLARITIN) 10 MG TABLET    Take 10 mg by mouth daily.   MEMANTINE (NAMENDA XR) 14 MG CP24 24 HR CAPSULE    Take 14 mg by mouth daily.   METOPROLOL SUCCINATE (TOPROL-XL) 25 MG 24 HR TABLET    Take 1 tablet (25 mg total) by mouth daily.   MONTELUKAST (SINGULAIR) 10 MG TABLET    Take 10 mg by mouth at bedtime.   MULTIPLE VITAMINS-MINERALS (MULTIVITAMIN) TABLET    Take 1 tablet by mouth daily.  NUTRITIONAL SUPPLEMENTS (NUTRITIONAL SUPPLEMENT PO)    Take 90 mLs by mouth 2 (two) times daily. Med Pass   QUETIAPINE (SEROQUEL) 25 MG TABLET    Take 37.5 mg by mouth at bedtime.   SERTRALINE (ZOLOFT) 50 MG TABLET    Take 0.5 tablets (25 mg total) by mouth daily.  Modified Medications   No medications on file  Discontinued Medications   No medications on file     SIGNIFICANT DIAGNOSTIC EXAMS  10-29-15: chest x-ray: No acute abnormality. Large hiatal hernia. Old compression fractures.  11-02-15: ct of abdomen and pelvis: 1. No acute abnormality in the abdomen/pelvis. 2. Resolution of previous apparent wall thickening involving the colon. 3. Diffuse bladder wall thickening with bladder diverticula consistent with chronic outlet obstruction. Additional chronic findings are stable.  11-06-15: acute abdomen x-ray: No bowel obstruction or free air. Retrocardiac hiatal hernia. Right nephrolithiasis again seen.   LABS REVIEWED:   10-29-15: wbc 5.5; hgb 13.1; hct 38.5; mcv 98.2; plt 245; glucose 109; bun 25; creat 0.82; k+ 3.1; na++146; ;liver normal albumin 4.0; mag 1.9; phos 2.7 10-30-15; c-diff:  neg 11-06-15: wbc 6.4; hgb 14.2; hct 41.7; mcv 98.6; plt 270; glucose 72; bun 15; creat 0.84; k+ 3.8; na++145; liver normal albumin 3.6; lipase 33.  11-10-15: wbc 5.5; hgb 12.9; hct 37.6; mcv 97.4; plt 272; glucose 69; bun 16; creat 0.81; k+ 4.1; na++140  03-27-16: glucose 106; bun 19; creat 0.96; k+ 4.5; na++ 141; liver normal albumin 3.6 07-26-16: wbc 5.6; hgb 12.8; hct 38.3; mcv 93.9; plt 244; glucose 84; bun 15; creat 1.10; k+ 4.2; na++ 140; liver normal albumin 3.4     Review of Systems  Constitutional: Negative for malaise/fatigue.  Respiratory: Negative for  shortness of breath.   Cardiovascular: Negative for chest pain, palpitations and leg swelling.  Gastrointestinal: Negative for heartburn, nausea, abdominal pain, diarrhea and constipation.  Musculoskeletal: Negative for myalgias and joint pain.  Skin: Negative.   Neurological: Negative for headaches.  Psychiatric/Behavioral: Negative for depression. The patient is not nervous/anxious.      Physical Exam  Constitutional: No distress.  Frail   Eyes: Conjunctivae are normal.  Neck: Neck supple. No JVD present. No thyromegaly present.  Cardiovascular: Normal rate, regular rhythm and intact distal pulses.   Respiratory: Effort normal and breath sounds normal. No respiratory distress. He has no wheezes.  GI: Soft. Bowel sounds are normal. He exhibits no distension. There is no tenderness.  Musculoskeletal: He exhibits no edema.  Able to move all extremities   Lymphadenopathy:    He has no cervical adenopathy.  Neurological: He is alert.  Skin: Skin is warm and dry. He is not diaphoretic.  Psychiatric: He has a normal mood and affect.      ASSESSMENT/ PLAN:  1. Alzheimer's disease: no significant change in his status; will continue his aricept 10 mg nightly and namenda xr 14 mg daily and will monitor  His current weight is 114 pounds.   2. Hypertension: will lower toprol xl to 25 mg daily and imdur to 15 mg daily and will  monitor his status.   3. Anxiety with depression: will continue zoloft 25 mg daily   4. Prostate cancer: gleason grade 7: is currently not on treatment will monitor    5. Bilateral shoulder pain: will continue capsaicin 0.025% cream daily as needed for joint pain  6. Lower abdominal pain: does have a right inguinal hernia; is presently not having pain will continue to monitor his status   7.  Allergic rhinitis: will continue  claritin 10 mg daily  singlair 10 mg daily  flonase daily   8. Psychosis: is stable will continue seroquel 37.5 mg nightly is followed by psych services    Ok Edwards NP [facility]  Contact [phone] Monday through Friday 8am- 5pm  After hours call [phone]

## 2016-09-26 ENCOUNTER — Non-Acute Institutional Stay (SKILLED_NURSING_FACILITY): Payer: Medicare HMO | Admitting: Adult Health

## 2016-09-26 ENCOUNTER — Encounter: Payer: Self-pay | Admitting: Adult Health

## 2016-09-26 DIAGNOSIS — I10 Essential (primary) hypertension: Secondary | ICD-10-CM

## 2016-09-26 DIAGNOSIS — C61 Malignant neoplasm of prostate: Secondary | ICD-10-CM

## 2016-09-26 DIAGNOSIS — F0280 Dementia in other diseases classified elsewhere without behavioral disturbance: Secondary | ICD-10-CM | POA: Diagnosis not present

## 2016-09-26 DIAGNOSIS — R1030 Lower abdominal pain, unspecified: Secondary | ICD-10-CM | POA: Diagnosis not present

## 2016-09-26 DIAGNOSIS — G301 Alzheimer's disease with late onset: Secondary | ICD-10-CM | POA: Diagnosis not present

## 2016-09-26 DIAGNOSIS — F418 Other specified anxiety disorders: Secondary | ICD-10-CM | POA: Diagnosis not present

## 2016-09-26 NOTE — Progress Notes (Signed)
Patient ID: [name] Rice, male   DOB: 1925/02/15, 82 y.o.   MRN: [id number]   Location:   [place] Room Number: 157-B Place of Service:  SNF (31)   CODE STATUS: Full Code  No Known Allergies  Chief Complaint  Patient presents with  . Medical Management of Chronic Issues    Follow up    HPI:  He is a long term resident of this facility being seen for the management of his chronic illnesses.  He is doing well. He does get out of bed on a daily basis; and does socialize with others. He  did tell me that he is doing well. There are no nursing concerns at this time.   Past Medical History:  Diagnosis Date  . Arthritis   . Cancer [facility])    prostate   . Dementia   . Diabetes mellitus without complication ([place])   . Difficulty urinating   . Hearing loss   . Nasal congestion   . Stroke ([place])   . 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 Weakness     Past Surgical History:  Procedure Laterality Date  . HERNIA REPAIR  2012    Social History   Social History  . Marital status: Widowed    Spouse name: N/A  . Number of children: N/A  . Years of education: N/A   Occupational History  . Not on file.   Social History Main Topics  . Smoking status: Current Every Day Smoker  . Smokeless tobacco: Not on file  . Alcohol use Yes     Comment: daily  . Drug use: No  . Sexual activity: No   Other Topics Concern  . Not on file   Social History Narrative  . No narrative on file   Family History  Problem Relation Age of Onset  . Heart attack Father   . Diabetes Brother   . Hypertension Brother       VITAL SIGNS BP (!) 105/57   Pulse 73   Temp (!) 96.6 F (35.9 C) (Oral)   Resp 18   Ht 5' (1.524 m)   Wt 126 lb (57.2 kg)   SpO2 95%   BMI 24.61 kg/m   Patient's Medications  New Prescriptions   No medications on file  Previous Medications   CAPSAICIN (ZOSTRIX) 0.025 % CREAM    Apply 1 application topically daily as needed.   CARBAMIDE  PEROXIDE (DEBROX) 6.5 % OTIC SOLUTION    Place 5 drops into both ears daily as needed (wax bld up).   DONEPEZIL (ARICEPT) 10 MG TABLET    Take 1 tablet (10 mg total) by mouth at bedtime.   FEXOFENADINE HCL PO    Take by mouth. Give 1200 mg bid x 2 weeks   FLUTICASONE (FLONASE) 50 MCG/ACT NASAL SPRAY    Place 2 sprays into both nostrils daily.   ISOSORBIDE MONONITRATE (IMDUR) 30 MG 24 HR TABLET    Take 1 tablet (30 mg total) by mouth daily.   LORATADINE (CLARITIN) 10 MG TABLET    Take 10 mg by mouth daily.   MEMANTINE (NAMENDA XR) 14 MG CP24 24 HR CAPSULE    Take 14 mg by mouth daily.   METOPROLOL SUCCINATE (TOPROL-XL) 25 MG 24 HR TABLET    Take 1 tablet (25 mg total) by mouth daily.   MONTELUKAST (SINGULAIR) 10 MG TABLET    Take 10 mg by mouth at bedtime.  MULTIPLE VITAMINS-MINERALS (MULTIVITAMIN) TABLET    Take 1 tablet by mouth daily.   NUTRITIONAL SUPPLEMENTS (NUTRITIONAL SUPPLEMENT PO)    Take 90 mLs by mouth 2 (two) times daily. Med Pass   QUETIAPINE (SEROQUEL) 25 MG TABLET    Take 37.5 mg by mouth at bedtime.   SERTRALINE (ZOLOFT) 50 MG TABLET    Take 0.5 tablets (25 mg total) by mouth daily.  Modified Medications   No medications on file  Discontinued Medications   No medications on file     SIGNIFICANT DIAGNOSTIC EXAMS  10-29-15: chest x-ray: No acute abnormality. Large hiatal hernia. Old compression fractures.  11-02-15: ct of abdomen and pelvis: 1. No acute abnormality in the abdomen/pelvis. 2. Resolution of previous apparent wall thickening involving the colon. 3. Diffuse bladder wall thickening with bladder diverticula consistent with chronic outlet obstruction. Additional chronic findings are stable.  11-06-15: acute abdomen x-ray: No bowel obstruction or free air. Retrocardiac hiatal hernia. Right nephrolithiasis again seen.   LABS REVIEWED:   10-29-15: wbc 5.5; hgb 13.1; hct 38.5; mcv 98.2; plt 245; glucose 109; bun 25; creat 0.82; k+ 3.1; na++146; ;liver normal albumin  4.0; mag 1.9; phos 2.7 10-30-15; c-diff: neg 11-06-15: wbc 6.4; hgb 14.2; hct 41.7; mcv 98.6; plt 270; glucose 72; bun 15; creat 0.84; k+ 3.8; na++145; liver normal albumin 3.6; lipase 33.  11-10-15: wbc 5.5; hgb 12.9; hct 37.6; mcv 97.4; plt 272; glucose 69; bun 16; creat 0.81; k+ 4.1; na++140  03-27-16: glucose 106; bun 19; creat 0.96; k+ 4.5; na++ 141; liver normal albumin 3.6 07-26-16: wbc 5.6; hgb 12.8; hct 38.3; mcv 93.9; plt 244; glucose 84; bun 15; creat 1.10; k+ 4.2; na++ 140; liver normal albumin 3.4     Review of Systems  Constitutional: Negative for malaise/fatigue.  Respiratory: Negative for  shortness of breath.   Cardiovascular: Negative for chest pain, palpitations and leg swelling.  Gastrointestinal: Negative for heartburn, nausea, abdominal pain, diarrhea and constipation.  Musculoskeletal: Negative for myalgias and joint pain.  Skin: Negative.   Neurological: Negative for headaches.  Psychiatric/Behavioral: Negative for depression. The patient is not nervous/anxious.      Physical Exam  Constitutional: No distress.  Frail   Eyes: Conjunctivae are normal.  Neck: Neck supple. No JVD present. No thyromegaly present.  Cardiovascular: Normal rate, regular rhythm and intact distal pulses.   Respiratory: Effort normal and breath sounds normal. No respiratory distress. He has no wheezes.  GI: Soft. Bowel sounds are normal. He exhibits no distension. There is no tenderness.  Musculoskeletal: He exhibits no edema.  Able to move all extremities   Lymphadenopathy:    He has no cervical adenopathy.  Neurological: He is alert.  Skin: Skin is warm and dry. He is not diaphoretic.  Psychiatric: He has a normal mood and affect.      ASSESSMENT/ PLAN:  1. Alzheimer's disease: no significant change in his status; will continue his aricept 10 mg nightly and namenda xr 14 mg daily and will monitor  His current weight is 114 pounds.   2. Hypertension: will continue toprol xlo 25 mg  daily will stop imdur  and will monitor his status.   3. Anxiety with depression: will continue zoloft 25 mg daily   4. Prostate cancer: gleason grade 7: is currently not on treatment will monitor    5. Bilateral shoulder pain: will continue capsaicin 0.025% cream daily as needed for joint pain  6. Lower abdominal pain: does have a right inguinal hernia; is presently  not having pain will continue to monitor his status   7. Allergic rhinitis: will continue  claritin 10 mg daily  singlair 10 mg daily  flonase daily   8. Psychosis: is stable will continue seroquel 37.5 mg nightly is followed by psych services          MD is aware of resident's narcotic use and is in agreement with current plan of care. We will attempt to wean resident as apropriate   Ok Edwards NP [facility]  Contact [phone] Monday through Friday 8am- 5pm  After hours call [phone]

## 2016-11-01 ENCOUNTER — Non-Acute Institutional Stay (SKILLED_NURSING_FACILITY): Payer: Medicare HMO | Admitting: Adult Health

## 2016-11-01 DIAGNOSIS — J302 Other seasonal allergic rhinitis: Secondary | ICD-10-CM

## 2016-11-01 DIAGNOSIS — F0280 Dementia in other diseases classified elsewhere without behavioral disturbance: Secondary | ICD-10-CM

## 2016-11-01 DIAGNOSIS — G301 Alzheimer's disease with late onset: Secondary | ICD-10-CM

## 2016-11-01 DIAGNOSIS — F418 Other specified anxiety disorders: Secondary | ICD-10-CM

## 2016-11-01 DIAGNOSIS — C61 Malignant neoplasm of prostate: Secondary | ICD-10-CM

## 2016-11-01 DIAGNOSIS — I10 Essential (primary) hypertension: Secondary | ICD-10-CM | POA: Diagnosis not present

## 2016-11-01 DIAGNOSIS — R1030 Lower abdominal pain, unspecified: Secondary | ICD-10-CM | POA: Diagnosis not present

## 2016-11-02 NOTE — Progress Notes (Signed)
Provider:   [facility]  [facility]    Place of Service:      PCP: Gildardo Cranker, DO Patient Care Team: Gildardo Cranker, DO as PCP - General (Internal Medicine) Gerlene Fee, NP as Nurse Practitioner (Geriatric Medicine) [facility] ([place])  Extended Emergency Contact Information Primary Emergency Contact: [place] Phone: [phone] Relation: Other  Code Status: full code  Goals of Care: Advanced Directive information Advanced Directives 09/26/2016  Does Patient Have a Medical Advance Directive? No  Type of Advance Directive -  Does patient want to make changes to medical advance directive? -  Copy of [place] in Chart? -  Would patient like information on creating a medical advance directive? -  Pre-existing out of facility DNR order (yellow form or pink MOST form) -      No Known Allergies   Chief Complaint  Patient presents with  . Annual Exam    HPI: Patient is a 83 y.o. male seen today for an annual comprehensive examination. He has not been hospitalized over the past year. He is doing well over all; he is sociable with everyone around him. He is not voicing any complaints. There are no nursing concerns at this time.  '  Past Medical History:  Diagnosis Date  . Arthritis   . Cancer [facility])    prostate   . Dementia   . Diabetes mellitus without complication ([place])   . Difficulty urinating   . Hearing loss   . Nasal congestion   . Stroke ([place])   . 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 Weakness    Past Surgical History:  Procedure Laterality Date  . HERNIA REPAIR  2012    reports that he has been smoking.  He does not have any smokeless tobacco history on file. He reports that he drinks alcohol. He reports that he does not use drugs. Social History   Social History  . Marital status: Widowed    Spouse name: N/A  . Number of children: N/A    . Years of education: N/A   Occupational History  . Not on file.   Social History Main Topics  . Smoking status: Current Every Day Smoker  . Smokeless tobacco: Not on file  . Alcohol use Yes     Comment: daily  . Drug use: No  . Sexual activity: No   Other Topics Concern  . Not on file   Social History Narrative  . No narrative on file   Family History  Problem Relation Age of Onset  . Heart attack Father   . Diabetes Brother   . Hypertension Brother     Vitals:   11/01/16 1907  BP: (!) 93/51  Pulse: 60  Resp: 18  Temp: 97.9 F (36.6 C)  SpO2: 94%  Weight: 114 lb 3.2 oz (51.8 kg)  Height: 5' (1.524 m)   Body mass index is 22.3 kg/m.   Outpatient Encounter Prescriptions as of 11/01/2016  Medication Sig  . capsaicin (ZOSTRIX) 0.025 % cream Apply 1 application topically daily as needed.  . carbamide peroxide (DEBROX) 6.5 % otic solution Place 5 drops into both ears daily as needed (wax bld up).  . donepezil (ARICEPT) 10 MG tablet Take 1 tablet (10 mg total) by mouth at bedtime.  . fluticasone (FLONASE) 50 MCG/ACT nasal spray Place 2 sprays into both nostrils daily.  Marland Kitchen loratadine (CLARITIN)  10 MG tablet Take 10 mg by mouth daily.  . memantine (NAMENDA XR) 14 MG CP24 24 hr capsule Take 14 mg by mouth daily.  . metoprolol succinate (TOPROL-XL) 25 MG 24 hr tablet Take 1 tablet (25 mg total) by mouth daily. (Patient taking differently: Take 12.5 mg by mouth daily. )  . montelukast (SINGULAIR) 10 MG tablet Take 10 mg by mouth at bedtime.  . Multiple Vitamins-Minerals (MULTIVITAMIN) tablet Take 1 tablet by mouth daily.  . Nutritional Supplements (NUTRITIONAL SUPPLEMENT PO) Take 90 mLs by mouth 2 (two) times daily. Med Pass  . QUEtiapine (SEROQUEL) 25 MG tablet Take 25 mg by mouth at bedtime.   . sertraline (ZOLOFT) 50 MG tablet Take 0.5 tablets (25 mg total) by mouth daily.   No facility-administered encounter medications on file as of 11/01/2016.      SIGNIFICANT  DIAGNOSTIC EXAMS   10-29-15: chest x-ray: No acute abnormality. Large hiatal hernia. Old compression fractures.  11-02-15: ct of abdomen and pelvis: 1. No acute abnormality in the abdomen/pelvis. 2. Resolution of previous apparent wall thickening involving the colon. 3. Diffuse bladder wall thickening with bladder diverticula consistent with chronic outlet obstruction. Additional chronic findings are stable.  11-06-15: acute abdomen x-ray: No bowel obstruction or free air. Retrocardiac hiatal hernia. Right nephrolithiasis again seen.   LABS REVIEWED:   10-29-15: wbc 5.5; hgb 13.1; hct 38.5; mcv 98.2; plt 245; glucose 109; bun 25; creat 0.82; k+ 3.1; na++146; ;liver normal albumin 4.0; mag 1.9; phos 2.7 10-30-15; c-diff: neg 11-06-15: wbc 6.4; hgb 14.2; hct 41.7; mcv 98.6; plt 270; glucose 72; bun 15; creat 0.84; k+ 3.8; na++145; liver normal albumin 3.6; lipase 33.  11-10-15: wbc 5.5; hgb 12.9; hct 37.6; mcv 97.4; plt 272; glucose 69; bun 16; creat 0.81; k+ 4.1; na++140  03-27-16: glucose 106; bun 19; creat 0.96; k+ 4.5; na++ 141; liver normal albumin 3.6 07-26-16: wbc 5.6; hgb 12.8; hct 38.3; mcv 93.9; plt 244; glucose 84; bun 15; creat 1.10; k+ 4.2; na++ 140; liver normal albumin 3.4     Review of Systems  Constitutional: Negative for malaise/fatigue.  Respiratory: Negative for  shortness of breath.   Cardiovascular: Negative for chest pain, palpitations and leg swelling.  Gastrointestinal: Negative for heartburn, nausea, abdominal pain, diarrhea and constipation.  Musculoskeletal: Negative for myalgias and joint pain.  Skin: Negative.   Neurological: Negative for headaches.  Psychiatric/Behavioral: Negative for depression. The patient is not nervous/anxious.      Physical Exam  Constitutional: No distress.  Frail   Eyes: Conjunctivae are normal.  He does blow his nose frequently  Neck: Neck supple. No JVD present. No thyromegaly present.  Cardiovascular: Normal rate, regular rhythm and  intact distal pulses.   Respiratory: Effort normal and breath sounds normal. No respiratory distress. He has no wheezes.  GI: Soft. Bowel sounds are normal. He exhibits no distension. There is no tenderness.  Musculoskeletal: He exhibits no edema.  Able to move all extremities   Lymphadenopathy:    He has no cervical adenopathy.  Neurological: He is alert.  Skin: Skin is warm and dry. He is not diaphoretic.  Psychiatric: He has a normal mood and affect.      ASSESSMENT/ PLAN:  1. Alzheimer's disease: no significant change in his status; will continue his aricept 10 mg nightly and namenda xr 14 mg daily and will monitor  His current weight is 114 pounds.   2. Hypertension: will stop the toprol xl and will continue to monitor his status.  3. Anxiety with depression: will continue zoloft 25 mg daily   4. Prostate cancer: gleason grade 7: is currently not on treatment will monitor    5. Bilateral shoulder pain: will continue capsaicin 0.025% cream daily as needed for joint pain  6. right inguinal hernia; is presently not having pain will continue to monitor his status   7. Allergic rhinitis: will continue  claritin 10 mg daily  singlair 10 mg daily  flonase daily   8. Psychosis: is stable will continue seroquel 25 mg nightly is followed by psych services   Will check hgb a1c ( on seroquel)    His health maintenance is up to date   Time spent with patient 45   minutes >50% time spent counseling; reviewing medical record; tests; labs; and developing future plan of care   Ok Edwards NP [facility]  Contact [phone] Monday through Friday 8am- 5pm  After hours call [phone]

## 2016-12-05 ENCOUNTER — Non-Acute Institutional Stay (SKILLED_NURSING_FACILITY): Payer: Medicare HMO | Admitting: Adult Health

## 2016-12-05 DIAGNOSIS — F0280 Dementia in other diseases classified elsewhere without behavioral disturbance: Secondary | ICD-10-CM

## 2016-12-05 DIAGNOSIS — I10 Essential (primary) hypertension: Secondary | ICD-10-CM

## 2016-12-05 DIAGNOSIS — K4090 Unilateral inguinal hernia, without obstruction or gangrene, not specified as recurrent: Secondary | ICD-10-CM

## 2016-12-05 DIAGNOSIS — F418 Other specified anxiety disorders: Secondary | ICD-10-CM

## 2016-12-05 DIAGNOSIS — J302 Other seasonal allergic rhinitis: Secondary | ICD-10-CM

## 2016-12-05 DIAGNOSIS — C61 Malignant neoplasm of prostate: Secondary | ICD-10-CM

## 2016-12-05 DIAGNOSIS — G301 Alzheimer's disease with late onset: Secondary | ICD-10-CM

## 2017-01-02 ENCOUNTER — Non-Acute Institutional Stay (SKILLED_NURSING_FACILITY): Payer: Medicare HMO | Admitting: Adult Health

## 2017-01-02 ENCOUNTER — Encounter: Payer: Self-pay | Admitting: Adult Health

## 2017-01-02 DIAGNOSIS — I10 Essential (primary) hypertension: Secondary | ICD-10-CM | POA: Diagnosis not present

## 2017-01-02 DIAGNOSIS — F418 Other specified anxiety disorders: Secondary | ICD-10-CM

## 2017-01-02 DIAGNOSIS — F0280 Dementia in other diseases classified elsewhere without behavioral disturbance: Secondary | ICD-10-CM

## 2017-01-02 DIAGNOSIS — G301 Alzheimer's disease with late onset: Secondary | ICD-10-CM

## 2017-01-02 DIAGNOSIS — C61 Malignant neoplasm of prostate: Secondary | ICD-10-CM | POA: Diagnosis not present

## 2017-01-02 DIAGNOSIS — K4090 Unilateral inguinal hernia, without obstruction or gangrene, not specified as recurrent: Secondary | ICD-10-CM | POA: Diagnosis not present

## 2017-01-02 NOTE — Progress Notes (Signed)
Location:   [place] Room Number: [place] of Service:  SNF (31)   CODE STATUS: Full Code  No Known Allergies  Chief Complaint  Patient presents with  . Medical Management of Chronic Issues    Routine Visit    HPI:  He is a long term resident of this facility being seen for the management of his chronic illnesses. He is stable. He continues to get out of bed on a daily basis. He is friendly with those around him. There are no nursing concerns at this time.    Past Medical History:  Diagnosis Date  . Arthritis   . Cancer [facility])    prostate   . Dementia   . Diabetes mellitus without complication ([place])   . Difficulty urinating   . Hearing loss   . Nasal congestion   . Stroke ([place])   . 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 Weakness     Past Surgical History:  Procedure Laterality Date  . HERNIA REPAIR  2012    Social History   Social History  . Marital status: Widowed    Spouse name: N/A  . Number of children: N/A  . Years of education: N/A   Occupational History  . Not on file.   Social History Main Topics  . Smoking status: Current Every Day Smoker  . Smokeless tobacco: Never Used  . Alcohol use Yes     Comment: daily  . Drug use: No  . Sexual activity: No   Other Topics Concern  . Not on file   Social History Narrative  . No narrative on file   Family History  Problem Relation Age of Onset  . Heart attack Father   . Diabetes Brother   . Hypertension Brother       VITAL SIGNS BP (!) 101/57   Pulse 77   Temp (!) 95.2 F (35.1 C)   Resp 19   Ht 5' (1.524 m)   SpO2 90%   Patient's Medications  New Prescriptions   No medications on file  Previous Medications   CAPSAICIN (ZOSTRIX) 0.025 % CREAM    Apply 1 application topically daily as needed.   CARBAMIDE PEROXIDE (DEBROX) 6.5 % OTIC SOLUTION    Place 5 drops into both ears daily as needed (wax bld up).   DONEPEZIL (ARICEPT) 10 MG TABLET    Take 1  tablet (10 mg total) by mouth at bedtime.   FLUTICASONE (FLONASE) 50 MCG/ACT NASAL SPRAY    Place 2 sprays into both nostrils daily.   GUAIFENESIN (MUCINEX) 600 MG 12 HR TABLET    Take 600 mg by mouth 2 (two) times daily.   LORATADINE (CLARITIN) 10 MG TABLET    Take 10 mg by mouth daily.   MEMANTINE (NAMENDA XR) 14 MG CP24 24 HR CAPSULE    Take 14 mg by mouth daily.   MONTELUKAST (SINGULAIR) 10 MG TABLET    Take 10 mg by mouth at bedtime.   MULTIPLE VITAMINS-MINERALS (MULTIVITAMIN) TABLET    Take 1 tablet by mouth daily.   NUTRITIONAL SUPPLEMENTS (NUTRITIONAL SUPPLEMENT PO)    Take 90 mLs by mouth 2 (two) times daily. Med Pass   QUETIAPINE (SEROQUEL) 25 MG TABLET    Take 25 mg by mouth at bedtime.    SERTRALINE (ZOLOFT) 50 MG TABLET    Take 0.5 tablets (25 mg total) by mouth daily.  Modified Medications   No medications  on file  Discontinued Medications   METOPROLOL SUCCINATE (TOPROL-XL) 25 MG 24 HR TABLET    Take 1 tablet (25 mg total) by mouth daily.     SIGNIFICANT DIAGNOSTIC EXAMS  10-29-15: chest x-ray: No acute abnormality. Large hiatal hernia. Old compression fractures.  11-02-15: ct of abdomen and pelvis: 1. No acute abnormality in the abdomen/pelvis. 2. Resolution of previous apparent wall thickening involving the colon. 3. Diffuse bladder wall thickening with bladder diverticula consistent with chronic outlet obstruction. Additional chronic findings are stable.  11-06-15: acute abdomen x-ray: No bowel obstruction or free air. Retrocardiac hiatal hernia. Right nephrolithiasis again seen.   LABS REVIEWED:   03-27-16: glucose 106; bun 19; creat 0.96; k+ 4.5; na++ 141; liver normal albumin 3.6 07-26-16: wbc 5.6; hgb 12.8; hct 38.3; mcv 93.9; plt 244; glucose 84; bun 15; creat 1.10; k+ 4.2; na++ 140; liver normal albumin 3.4     Review of Systems  Constitutional: Negative for malaise/fatigue.  Respiratory: Negative for  shortness of breath.   Cardiovascular: Negative for chest  pain, palpitations and leg swelling.  Gastrointestinal: Negative for heartburn, nausea, abdominal pain, diarrhea and constipation.  Musculoskeletal: Negative for myalgias and joint pain.  Skin: Negative.   Neurological: Negative for headaches.  Psychiatric/Behavioral: Negative for depression. The patient is not nervous/anxious.      Physical Exam  Constitutional: No distress.  Frail   Eyes: Conjunctivae are normal.  Neck: Neck supple. No JVD present. No thyromegaly present.  Cardiovascular: Normal rate, regular rhythm and intact distal pulses.   Respiratory: Effort normal and breath sounds normal. No respiratory distress. He has no wheezes.  GI: Soft. Bowel sounds are normal. He exhibits no distension. There is no tenderness.  Musculoskeletal: He exhibits no edema.  Able to move all extremities   Lymphadenopathy:    He has no cervical adenopathy.  Neurological: He is alert.  Skin: Skin is warm and dry. He is not diaphoretic.  Psychiatric: He has a normal mood and affect.      ASSESSMENT/ PLAN:  1. Alzheimer's disease: no significant change in his status; will continue his aricept 10 mg nightly and namenda xr 14 mg daily and will monitor  His current weight is 114 pounds.   2. Hypertension: is currently off medications  will continue to monitor his status.   3. Anxiety with depression: will continue zoloft 25 mg daily   4. Prostate cancer: gleason grade 7: is currently not on treatment will monitor    5. Bilateral shoulder pain: will continue capsaicin 0.025% cream daily as needed for joint pain  6. right inguinal hernia; is presently not having pain will continue to monitor his status   7. Allergic rhinitis: will continue  claritin 10 mg daily  singlair 10 mg daily  flonase daily   8. Psychosis: is stable will lower seroquel to 12.5  mg nightly is followed by psych services   Will check cbc; cmp; hgb a1c   Ok Edwards NP [facility]  Contact  [phone] Monday through Friday 8am- 5pm  After hours call [phone]

## 2017-01-03 LAB — BASIC METABOLIC PANEL
BUN: 22 mg/dL — AB (ref 4–21)
CREATININE: 0.9 mg/dL (ref 0.6–1.3)
Glucose: 119 mg/dL
Potassium: 4.3 mmol/L (ref 3.4–5.3)
SODIUM: 144 mmol/L (ref 137–147)

## 2017-01-03 LAB — CBC AND DIFFERENTIAL
HCT: 43 % (ref 41–53)
HEMOGLOBIN: 14.5 g/dL (ref 13.5–17.5)
NEUTROS ABS: 3 /uL
Platelets: 242 10*3/uL (ref 150–399)
WBC: 5.7 10^3/mL

## 2017-01-03 LAB — HEPATIC FUNCTION PANEL
ALT: 9 U/L — AB (ref 10–40)
AST: 17 U/L (ref 14–40)
Alkaline Phosphatase: 67 U/L (ref 25–125)
Bilirubin, Total: 0.2 mg/dL

## 2017-01-06 NOTE — Progress Notes (Signed)
Location:   [facility] of Service:  SNF (31)   CODE STATUS: full code  No Known Allergies  Chief Complaint  Patient presents with  . Medical Management of Chronic Issues    HPI:  He is a long term resident of this facility being seen for the management of his chronic illnesses. Overall his status is stable. He does get out of bed on a daily basis and is sociable those around him. He tells me that he is feeling good. There are no nursing concerns at this time.     Past Medical History:  Diagnosis Date  . Arthritis   . Cancer [facility])    prostate   . Dementia   . Diabetes mellitus without complication ([place])   . Difficulty urinating   . Hearing loss   . Nasal congestion   . Stroke ([place])   . 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 Weakness     Past Surgical History:  Procedure Laterality Date  . HERNIA REPAIR  2012    Social History   Social History  . Marital status: Widowed    Spouse name: N/A  . Number of children: N/A  . Years of education: N/A   Occupational History  . Not on file.   Social History Main Topics  . Smoking status: Current Every Day Smoker  . Smokeless tobacco: Never Used  . Alcohol use Yes     Comment: daily  . Drug use: No  . Sexual activity: No   Other Topics Concern  . Not on file   Social History Narrative  . No narrative on file   Family History  Problem Relation Age of Onset  . Heart attack Father   . Diabetes Brother   . Hypertension Brother       VITAL SIGNS BP 108/62   Pulse 65   Temp 97.9 F (36.6 C)   Resp 20   Ht 5' (1.524 m)   Wt 118 lb (53.5 kg)   SpO2 95%   BMI 23.05 kg/m   Patient's Medications  New Prescriptions   No medications on file  Previous Medications   CAPSAICIN (ZOSTRIX) 0.025 % CREAM    Apply 1 application topically daily as needed.   CARBAMIDE PEROXIDE (DEBROX) 6.5 % OTIC SOLUTION    Place 5 drops into both ears daily as needed (wax bld up).   DONEPEZIL  (ARICEPT) 10 MG TABLET    Take 1 tablet (10 mg total) by mouth at bedtime.   FLUTICASONE (FLONASE) 50 MCG/ACT NASAL SPRAY    Place 2 sprays into both nostrils daily.   GUAIFENESIN (MUCINEX) 600 MG 12 HR TABLET    Take 600 mg by mouth 2 (two) times daily.   LORATADINE (CLARITIN) 10 MG TABLET    Take 10 mg by mouth daily.   MEMANTINE (NAMENDA XR) 14 MG CP24 24 HR CAPSULE    Take 14 mg by mouth daily.   MONTELUKAST (SINGULAIR) 10 MG TABLET    Take 10 mg by mouth at bedtime.   MULTIPLE VITAMINS-MINERALS (MULTIVITAMIN) TABLET    Take 1 tablet by mouth daily.   NUTRITIONAL SUPPLEMENTS (NUTRITIONAL SUPPLEMENT PO)    Take 90 mLs by mouth 2 (two) times daily. Med Pass   QUETIAPINE (SEROQUEL) 25 MG TABLET    Take 25 mg by mouth at bedtime.    SERTRALINE (ZOLOFT) 50 MG TABLET    Take 0.5 tablets (25  mg total) by mouth daily.  Modified Medications   No medications on file  Discontinued Medications   No medications on file     SIGNIFICANT DIAGNOSTIC EXAMS   10-29-15: chest x-ray: No acute abnormality. Large hiatal hernia. Old compression fractures.  11-02-15: ct of abdomen and pelvis: 1. No acute abnormality in the abdomen/pelvis. 2. Resolution of previous apparent wall thickening involving the colon. 3. Diffuse bladder wall thickening with bladder diverticula consistent with chronic outlet obstruction. Additional chronic findings are stable.  11-06-15: acute abdomen x-ray: No bowel obstruction or free air. Retrocardiac hiatal hernia. Right nephrolithiasis again seen.   LABS REVIEWED:   10-29-15: wbc 5.5; hgb 13.1; hct 38.5; mcv 98.2; plt 245; glucose 109; bun 25; creat 0.82; k+ 3.1; na++146; ;liver normal albumin 4.0; mag 1.9; phos 2.7 10-30-15; c-diff: neg 11-06-15: wbc 6.4; hgb 14.2; hct 41.7; mcv 98.6; plt 270; glucose 72; bun 15; creat 0.84; k+ 3.8; na++145; liver normal albumin 3.6; lipase 33.  11-10-15: wbc 5.5; hgb 12.9; hct 37.6; mcv 97.4; plt 272; glucose 69; bun 16; creat 0.81; k+ 4.1; na++140    03-27-16: glucose 106; bun 19; creat 0.96; k+ 4.5; na++ 141; liver normal albumin 3.6 07-26-16: wbc 5.6; hgb 12.8; hct 38.3; mcv 93.9; plt 244; glucose 84; bun 15; creat 1.10; k+ 4.2; na++ 140; liver normal albumin 3.4     Review of Systems  Constitutional: Negative for malaise/fatigue.  Respiratory: Negative for  shortness of breath.   Cardiovascular: Negative for chest pain, palpitations and leg swelling.  Gastrointestinal: Negative for heartburn, nausea, abdominal pain, diarrhea and constipation.  Musculoskeletal: Negative for myalgias and joint pain.  Skin: Negative.   Neurological: Negative for headaches.  Psychiatric/Behavioral: Negative for depression. The patient is not nervous/anxious.      Physical Exam  Constitutional: No distress.  Frail   Eyes: Conjunctivae are normal.  He does blow his nose frequently  Neck: Neck supple. No JVD present. No thyromegaly present.  Cardiovascular: Normal rate, regular rhythm and intact distal pulses.   Respiratory: Effort normal and breath sounds normal. No respiratory distress. He has no wheezes.  GI: Soft. Bowel sounds are normal. He exhibits no distension. There is no tenderness.  Musculoskeletal: He exhibits no edema.  Able to move all extremities   Lymphadenopathy:    He has no cervical adenopathy.  Neurological: He is alert.  Skin: Skin is warm and dry. He is not diaphoretic.  Psychiatric: He has a normal mood and affect.      ASSESSMENT/ PLAN:  1. Alzheimer's disease: no significant change in his status; will continue his aricept 10 mg nightly and namenda xr 14 mg daily and will monitor  His current weight is 118 pounds.   2. Hypertension: his toprol xl has been discontinued due to low blood pressure; will not make changes will monitor    3. Anxiety with depression: will continue zoloft 25 mg daily   4. Prostate cancer: gleason grade 7: is currently not on treatment will monitor    5. Bilateral shoulder pain: will  continue capsaicin 0.025% cream daily as needed for joint pain  6. right inguinal hernia; is presently not having pain will continue to monitor his status   7. Allergic rhinitis: will continue  claritin 10 mg daily  singlair 10 mg daily  flonase daily mucinex twice daily   8. Psychosis: is stable will continue seroquel 25 mg nightly is followed by psych services     MD is aware of resident's narcotic use  and is in agreement with current plan of care. We will attempt to wean resident as apropriate   Ok Edwards NP [facility]  Contact [phone] Monday through Friday 8am- 5pm  After hours call [phone]

## 2017-02-03 ENCOUNTER — Non-Acute Institutional Stay (SKILLED_NURSING_FACILITY): Payer: Medicare HMO | Admitting: Adult Health

## 2017-02-03 ENCOUNTER — Encounter: Payer: Self-pay | Admitting: Adult Health

## 2017-02-03 DIAGNOSIS — C61 Malignant neoplasm of prostate: Secondary | ICD-10-CM

## 2017-02-03 DIAGNOSIS — G301 Alzheimer's disease with late onset: Secondary | ICD-10-CM | POA: Diagnosis not present

## 2017-02-03 DIAGNOSIS — F0280 Dementia in other diseases classified elsewhere without behavioral disturbance: Secondary | ICD-10-CM | POA: Diagnosis not present

## 2017-02-03 DIAGNOSIS — J302 Other seasonal allergic rhinitis: Secondary | ICD-10-CM | POA: Diagnosis not present

## 2017-02-03 DIAGNOSIS — F418 Other specified anxiety disorders: Secondary | ICD-10-CM

## 2017-02-03 DIAGNOSIS — I10 Essential (primary) hypertension: Secondary | ICD-10-CM

## 2017-02-03 NOTE — Progress Notes (Signed)
Location:   [facility] Room Number: [place] of Service:  SNF (31)   CODE STATUS: Full Code  No Known Allergies  Chief Complaint  Patient presents with  . Medical Management of Chronic Issues    1 month follow up    HPI:  He is a long term resident of this facility being seen for the management of his chronic illnesses. He is doing well. He is up and about the facility. He is not voicing any concerns at this time.  There are no nursing concerns at this time.   Past Medical History:  Diagnosis Date  . Arthritis   . Cancer [facility])    prostate   . Dementia   . Diabetes mellitus without complication ([place])   . Difficulty urinating   . Hearing loss   . Nasal congestion   . Stroke ([place])   . 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 Weakness     Past Surgical History:  Procedure Laterality Date  . HERNIA REPAIR  2012    Social History   Social History  . Marital status: Widowed    Spouse name: N/A  . Number of children: N/A  . Years of education: N/A   Occupational History  . Not on file.   Social History Main Topics  . Smoking status: Current Every Day Smoker  . Smokeless tobacco: Never Used  . Alcohol use Yes     Comment: daily  . Drug use: No  . Sexual activity: No   Other Topics Concern  . Not on file   Social History Narrative  . No narrative on file   Family History  Problem Relation Age of Onset  . Heart attack Father   . Diabetes Brother   . Hypertension Brother       VITAL SIGNS BP (!) 112/58   Pulse 78   Temp 98.1 F (36.7 C)   Resp 16   Ht 5' (1.524 m)   Wt 118 lb 6.4 oz (53.7 kg)   SpO2 98%   BMI 23.12 kg/m   Patient's Medications  New Prescriptions   No medications on file  Previous Medications   CAPSAICIN (ZOSTRIX) 0.025 % CREAM    Apply 1 application topically daily as needed.   CARBAMIDE PEROXIDE (DEBROX) 6.5 % OTIC SOLUTION    Place 5 drops into both ears daily as needed (wax bld up).   DONEPEZIL (ARICEPT) 10 MG TABLET    Take 1 tablet (10 mg total) by mouth at bedtime.   FLUTICASONE (FLONASE) 50 MCG/ACT NASAL SPRAY    Place 2 sprays into both nostrils daily.   GUAIFENESIN (MUCINEX) 600 MG 12 HR TABLET    Take 600 mg by mouth 2 (two) times daily.   LORATADINE (CLARITIN) 10 MG TABLET    Take 10 mg by mouth daily.   MEMANTINE (NAMENDA XR) 14 MG CP24 24 HR CAPSULE    Take 14 mg by mouth daily.   MONTELUKAST (SINGULAIR) 10 MG TABLET    Take 10 mg by mouth at bedtime.   MULTIPLE VITAMINS-MINERALS (MULTIVITAMIN) TABLET    Take 1 tablet by mouth daily.   NUTRITIONAL SUPPLEMENTS (NUTRITIONAL SUPPLEMENT PO)    Take 90 mLs by mouth 2 (two) times daily. [facility]   QUETIAPINE (SEROQUEL) 25 MG TABLET    Take 12.5 mg by mouth at bedtime.    SERTRALINE (ZOLOFT) 25 MG TABLET    Take 25 mg  by mouth daily.  Modified Medications   No medications on file  Discontinued Medications   SERTRALINE (ZOLOFT) 50 MG TABLET    Take 0.5 tablets (25 mg total) by mouth daily.     SIGNIFICANT DIAGNOSTIC EXAMS  10-29-15: chest x-ray: No acute abnormality. Large hiatal hernia. Old compression fractures.  11-02-15: ct of abdomen and pelvis: 1. No acute abnormality in the abdomen/pelvis. 2. Resolution of previous apparent wall thickening involving the colon. 3. Diffuse bladder wall thickening with bladder diverticula consistent with chronic outlet obstruction. Additional chronic findings are stable.  11-06-15: acute abdomen x-ray: No bowel obstruction or free air. Retrocardiac hiatal hernia. Right nephrolithiasis again seen.   LABS REVIEWED:   03-27-16: glucose 106; bun 19; creat 0.96; k+ 4.5; na++ 141; liver normal albumin 3.6 07-26-16: wbc 5.6; hgb 12.8; hct 38.3; mcv 93.9; plt 244; glucose 84; bun 15; creat 1.10; k+ 4.2; na++ 140; liver normal albumin 3.4   01-03-17: wbc 5.7; hgb 14.5; hct 43.0; mcv 96.7; plt 242; glucose 119; bun 21.5; creat 0.91; k+ 4.3; na++ 144; liver normal albumin 4.1; hgb a1c 5.2     Review of Systems  Constitutional: Negative for malaise/fatigue.  Respiratory: Negative for  shortness of breath.   Cardiovascular: Negative for chest pain, palpitations and leg swelling.  Gastrointestinal: Negative for heartburn, nausea, abdominal pain, diarrhea and constipation.  Musculoskeletal: Negative for myalgias and joint pain.  Skin: Negative.   Neurological: Negative for headaches.  Psychiatric/Behavioral: Negative for depression. The patient is not nervous/anxious.      Physical Exam  Constitutional: No distress.  Frail   Eyes: Conjunctivae are normal.  Neck: Neck supple. No JVD present. No thyromegaly present.  Cardiovascular: Normal rate, regular rhythm and intact distal pulses.   Respiratory: Effort normal and breath sounds normal. No respiratory distress. He has no wheezes.  GI: Soft. Bowel sounds are normal. He exhibits no distension. There is no tenderness.  Musculoskeletal: He exhibits no edema.  Able to move all extremities   Lymphadenopathy:    He has no cervical adenopathy.  Neurological: He is alert.  Skin: Skin is warm and dry. He is not diaphoretic.  Psychiatric: He has a normal mood and affect.      ASSESSMENT/ PLAN:  1. Alzheimer's disease: no significant change in his status; will continue his aricept 10 mg nightly and namenda xr 14 mg daily and will monitor  His current weight is 118 pounds.   2. Hypertension: is currently off medications  will continue to monitor his status.   3. Anxiety with depression: will continue zoloft 25 mg daily   4. Prostate cancer: gleason grade 7: is currently not on treatment will monitor    5. Bilateral shoulder pain: will continue capsaicin 0.025% cream daily as needed for joint pain  6. right inguinal hernia; is presently not having pain will continue to monitor his status   7. Allergic rhinitis: will continue  claritin 10 mg daily  singlair 10 mg daily  flonase daily  Will change mucinex twice daily as  needed   8. Psychosis: is stable continue seroquel 12.5  mg nightly is followed by psych services    Ok Edwards NP [facility]  Contact [phone] Monday through Friday 8am- 5pm  After hours call [phone]

## 2017-03-06 ENCOUNTER — Non-Acute Institutional Stay (SKILLED_NURSING_FACILITY): Payer: Medicare HMO | Admitting: Adult Health

## 2017-03-06 ENCOUNTER — Encounter: Payer: Self-pay | Admitting: Adult Health

## 2017-03-06 DIAGNOSIS — C61 Malignant neoplasm of prostate: Secondary | ICD-10-CM | POA: Diagnosis not present

## 2017-03-06 DIAGNOSIS — K4090 Unilateral inguinal hernia, without obstruction or gangrene, not specified as recurrent: Secondary | ICD-10-CM

## 2017-03-06 DIAGNOSIS — F418 Other specified anxiety disorders: Secondary | ICD-10-CM | POA: Diagnosis not present

## 2017-03-06 DIAGNOSIS — F0280 Dementia in other diseases classified elsewhere without behavioral disturbance: Secondary | ICD-10-CM

## 2017-03-06 DIAGNOSIS — G301 Alzheimer's disease with late onset: Secondary | ICD-10-CM | POA: Diagnosis not present

## 2017-03-06 DIAGNOSIS — J302 Other seasonal allergic rhinitis: Secondary | ICD-10-CM

## 2017-03-06 NOTE — Progress Notes (Signed)
Location:   [place] Room Number: [place] of Service:  SNF (31)   CODE STATUS: Full Code  No Known Allergies  Chief Complaint  Patient presents with  . Medical Management of Chronic Issues    1 month follow up    HPI:  He is a long term resident of this facility being seen for the management of his chronic illnesses. Overall his status is stable. He is social, is out of bed daily and out and about the facility. He is unable to fully participate in the hpi or ros; but did say that he feels good. There are no nursing concerns at this time.     Past Medical History:  Diagnosis Date  . Arthritis   . Cancer [facility])    prostate   . Dementia   . Diabetes mellitus without complication ([place])   . Difficulty urinating   . Hearing loss   . Nasal congestion   . Stroke ([place])   . 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 Weakness     Past Surgical History:  Procedure Laterality Date  . HERNIA REPAIR  2012    Social History   Social History  . Marital status: Widowed    Spouse name: N/A  . Number of children: N/A  . Years of education: N/A   Occupational History  . Not on file.   Social History Main Topics  . Smoking status: Current Every Day Smoker  . Smokeless tobacco: Never Used  . Alcohol use Yes     Comment: daily  . Drug use: No  . Sexual activity: No   Other Topics Concern  . Not on file   Social History Narrative  . No narrative on file   Family History  Problem Relation Age of Onset  . Heart attack Father   . Diabetes Brother   . Hypertension Brother       VITAL SIGNS BP (!) 109/59   Pulse 74   Temp 98.9 F (37.2 C)   Resp 17   Ht 5' (1.524 m)   Wt 119 lb (54 kg)   SpO2 96%   BMI 23.24 kg/m   Patient's Medications  New Prescriptions   No medications on file  Previous Medications   CAPSAICIN (ZOSTRIX) 0.025 % CREAM    Apply 1 application topically daily as needed.   CARBAMIDE PEROXIDE (DEBROX) 6.5 % OTIC  SOLUTION    Place 5 drops into both ears daily as needed (wax bld up).   DONEPEZIL (ARICEPT) 10 MG TABLET    Take 1 tablet (10 mg total) by mouth at bedtime.   FLUTICASONE (FLONASE) 50 MCG/ACT NASAL SPRAY    Place 2 sprays into both nostrils daily.   GUAIFENESIN (MUCINEX) 600 MG 12 HR TABLET    Take 600 mg by mouth 2 (two) times daily.   LORATADINE (CLARITIN) 10 MG TABLET    Take 10 mg by mouth daily.   MEMANTINE (NAMENDA XR) 14 MG CP24 24 HR CAPSULE    Take 14 mg by mouth daily.   MONTELUKAST (SINGULAIR) 10 MG TABLET    Take 10 mg by mouth at bedtime.   MULTIPLE VITAMINS-MINERALS (MULTIVITAMIN) TABLET    Take 1 tablet by mouth daily.   NUTRITIONAL SUPPLEMENTS (NUTRITIONAL SUPPLEMENT PO)    Take 90 mLs by mouth 2 (two) times daily. Med Pass   QUETIAPINE (SEROQUEL) 25 MG TABLET    Take 12.5 mg by  mouth at bedtime.    SERTRALINE (ZOLOFT) 25 MG TABLET    Take 25 mg by mouth daily.  Modified Medications   No medications on file  Discontinued Medications   No medications on file     SIGNIFICANT DIAGNOSTIC EXAMS  10-29-15: chest x-ray: No acute abnormality. Large hiatal hernia. Old compression fractures.  11-02-15: ct of abdomen and pelvis: 1. No acute abnormality in the abdomen/pelvis. 2. Resolution of previous apparent wall thickening involving the colon. 3. Diffuse bladder wall thickening with bladder diverticula consistent with chronic outlet obstruction. Additional chronic findings are stable.  11-06-15: acute abdomen x-ray: No bowel obstruction or free air. Retrocardiac hiatal hernia. Right nephrolithiasis again seen.   LABS REVIEWED:   03-27-16: glucose 106; bun 19; creat 0.96; k+ 4.5; na++ 141; liver normal albumin 3.6 07-26-16: wbc 5.6; hgb 12.8; hct 38.3; mcv 93.9; plt 244; glucose 84; bun 15; creat 1.10; k+ 4.2; na++ 140; liver normal albumin 3.4   01-03-17: wbc 5.7; hgb 14.5; hct 43.0; mcv 96.7; plt 242; glucose 119; bun 21.5; creat 0.91; k+ 4.3; na++ 144; liver normal albumin 4.1; hgb  a1c 5.2     Review of Systems  Constitutional: Negative for malaise/fatigue.  Respiratory: Negative for  shortness of breath.   Cardiovascular: Negative for chest pain, palpitations and leg swelling.  Gastrointestinal: Negative for heartburn, nausea, abdominal pain, diarrhea and constipation.  Musculoskeletal: Negative for myalgias and joint pain.  Skin: Negative.   Neurological: Negative for headaches.  Psychiatric/Behavioral: Negative for depression. The patient is not nervous/anxious.      Physical Exam  Constitutional: No distress.  Frail   Eyes: Conjunctivae are normal.  Neck: Neck supple. No JVD present. No thyromegaly present.  Cardiovascular: Normal rate, regular rhythm and intact distal pulses.   Respiratory: Effort normal and breath sounds normal. No respiratory distress. He has no wheezes.  GI: Soft. Bowel sounds are normal. He exhibits no distension. There is no tenderness.  Musculoskeletal: He exhibits no edema.  Able to move all extremities   Lymphadenopathy:    He has no cervical adenopathy.  Neurological: He is alert.  Skin: Skin is warm and dry. He is not diaphoretic.  Psychiatric: He has a normal mood and affect.      ASSESSMENT/ PLAN:  1. Alzheimer's disease: no significant change in his status; will continue his aricept 10 mg nightly and namenda xr 14 mg daily and will monitor  His current weight is 119 pounds.   2. Hypertension: b/p 109/59 is currently off medications  will continue to monitor his status.   3. Anxiety with depression: will continue zoloft 25 mg daily   4. Prostate cancer: gleason grade 7: is currently not on treatment will monitor    5. Bilateral shoulder pain: will continue capsaicin 0.025% cream daily as needed for joint pain  6. right inguinal hernia; is presently not having pain will continue to monitor his status   7. Allergic rhinitis: will continue  claritin 10 mg daily  singlair 10 mg daily  flonase daily and  mucinex twice  daily as needed   8. Psychosis: is stable continue seroquel 12.5  mg nightly is followed by psych services   Ok Edwards NP [facility]  Contact [phone] Monday through Friday 8am- 5pm  After hours call [phone]

## 2017-04-10 ENCOUNTER — Non-Acute Institutional Stay (SKILLED_NURSING_FACILITY): Payer: Medicare HMO

## 2017-04-10 DIAGNOSIS — Z0000 Encounter for general adult medical examination without abnormal findings: Secondary | ICD-10-CM

## 2017-04-10 NOTE — Patient Instructions (Signed)
[name] Rice , Thank you for taking time to come for your Medicare Wellness Visit. I appreciate your ongoing commitment to your health goals. Please review the following plan we discussed and let me know if I can assist you in the future.   Screening recommendations/referrals: Colonoscopy up to date, pt over age 81 Recommended yearly ophthalmology/optometry visit for glaucoma screening and checkup Recommended yearly dental visit for hygiene and checkup  Vaccinations: Influenza vaccine due when available Pneumococcal vaccine 13 due Tdap vaccine due Shingles vaccine not in records    Advanced directives: Need a copy for the chart  Conditions/risks identified: None   Next appointment: None upcoming  Preventive Care 61 Years and Older, Male Preventive care refers to lifestyle choices and visits with your health care provider that can promote health and wellness. What does preventive care include?  A yearly physical exam. This is also called an annual well check.  Dental exams once or twice a year.  Routine eye exams. Ask your health care provider how often you should have your eyes checked.  Personal lifestyle choices, including:  Daily care of your teeth and gums.  Regular physical activity.  Eating a healthy diet.  Avoiding tobacco and drug use.  Limiting alcohol use.  Practicing safe sex.  Taking low doses of aspirin every day.  Taking vitamin and mineral supplements as recommended by your health care provider. What happens during an annual well check? The services and screenings done by your health care provider during your annual well check will depend on your age, overall health, lifestyle risk factors, and family history of disease. Counseling  Your health care provider may ask you questions about your:  Alcohol use.  Tobacco use.  Drug use.  Emotional well-being.  Home and relationship well-being.  Sexual activity.  Eating habits.  History of  falls.  Memory and ability to understand (cognition).  Work and work Statistician. Screening  You may have the following tests or measurements:  Height, weight, and BMI.  Blood pressure.  Lipid and cholesterol levels. These may be checked every 5 years, or more frequently if you are over 8 years old.  Skin check.  Lung cancer screening. You may have this screening every year starting at age 27 if you have a 30-pack-year history of smoking and currently smoke or have quit within the past 15 years.  Fecal occult blood test (FOBT) of the stool. You may have this test every year starting at age 45.  Flexible sigmoidoscopy or colonoscopy. You may have a sigmoidoscopy every 5 years or a colonoscopy every 10 years starting at age 42.  Prostate cancer screening. Recommendations will vary depending on your family history and other risks.  Hepatitis C blood test.  Hepatitis B blood test.  Sexually transmitted disease (STD) testing.  Diabetes screening. This is done by checking your blood sugar (glucose) after you have not eaten for a while (fasting). You may have this done every 1-3 years.  Abdominal aortic aneurysm (AAA) screening. You may need this if you are a current or former smoker.  Osteoporosis. You may be screened starting at age 40 if you are at high risk. Talk with your health care provider about your test results, treatment options, and if necessary, the need for more tests. Vaccines  Your health care provider may recommend certain vaccines, such as:  Influenza vaccine. This is recommended every year.  Tetanus, diphtheria, and acellular pertussis (Tdap, Td) vaccine. You may need a Td booster every 10 years.  Zoster vaccine. You may need this after age 91.  Pneumococcal 13-valent conjugate (PCV13) vaccine. One dose is recommended after age 67.  Pneumococcal polysaccharide (PPSV23) vaccine. One dose is recommended after age 54. Talk to your health care provider about  which screenings and vaccines you need and how often you need them. This information is not intended to replace advice given to you by your health care provider. Make sure you discuss any questions you have with your health care provider. Document Released: 11/10/2015 Document Revised: 07/03/2016 Document Reviewed: 08/15/2015 Elsevier Interactive Patient Education  2017 [place] Prevention in the Home Falls can cause injuries. They can happen to people of all ages. There are many things you can do to make your home safe and to help prevent falls. What can I do on the outside of my home?  Regularly fix the edges of walkways and driveways and fix any cracks.  Remove anything that might make you trip as you walk through a door, such as a raised step or threshold.  Trim any bushes or trees on the path to your home.  Use bright outdoor lighting.  Clear any walking paths of anything that might make someone trip, such as rocks or tools.  Regularly check to see if handrails are loose or broken. Make sure that both sides of any steps have handrails.  Any raised decks and porches should have guardrails on the edges.  Have any leaves, snow, or ice cleared regularly.  Use sand or salt on walking paths during winter.  Clean up any spills in your garage right away. This includes oil or grease spills. What can I do in the bathroom?  Use night lights.  Install grab bars by the toilet and in the tub and shower. Do not use towel bars as grab bars.  Use non-skid mats or decals in the tub or shower.  If you need to sit down in the shower, use a plastic, non-slip stool.  Keep the floor dry. Clean up any water that spills on the floor as soon as it happens.  Remove soap buildup in the tub or shower regularly.  Attach bath mats securely with double-sided non-slip rug tape.  Do not have throw rugs and other things on the floor that can make you trip. What can I do in the  bedroom?  Use night lights.  Make sure that you have a light by your bed that is easy to reach.  Do not use any sheets or blankets that are too big for your bed. They should not hang down onto the floor.  Have a firm chair that has side arms. You can use this for support while you get dressed.  Do not have throw rugs and other things on the floor that can make you trip. What can I do in the kitchen?  Clean up any spills right away.  Avoid walking on wet floors.  Keep items that you use a lot in easy-to-reach places.  If you need to reach something above you, use a strong step stool that has a grab bar.  Keep electrical cords out of the way.  Do not use floor polish or wax that makes floors slippery. If you must use wax, use non-skid floor wax.  Do not have throw rugs and other things on the floor that can make you trip. What can I do with my stairs?  Do not leave any items on the stairs.  Make sure that there are  handrails on both sides of the stairs and use them. Fix handrails that are broken or loose. Make sure that handrails are as long as the stairways.  Check any carpeting to make sure that it is firmly attached to the stairs. Fix any carpet that is loose or worn.  Avoid having throw rugs at the top or bottom of the stairs. If you do have throw rugs, attach them to the floor with carpet tape.  Make sure that you have a light switch at the top of the stairs and the bottom of the stairs. If you do not have them, ask someone to add them for you. What else can I do to help prevent falls?  Wear shoes that:  Do not have high heels.  Have rubber bottoms.  Are comfortable and fit you well.  Are closed at the toe. Do not wear sandals.  If you use a stepladder:  Make sure that it is fully opened. Do not climb a closed stepladder.  Make sure that both sides of the stepladder are locked into place.  Ask someone to hold it for you, if possible.  Clearly mark and make  sure that you can see:  Any grab bars or handrails.  First and last steps.  Where the edge of each step is.  Use tools that help you move around (mobility aids) if they are needed. These include:  Canes.  Walkers.  Scooters.  Crutches.  Turn on the lights when you go into a dark area. Replace any light bulbs as soon as they burn out.  Set up your furniture so you have a clear path. Avoid moving your furniture around.  If any of your floors are uneven, fix them.  If there are any pets around you, be aware of where they are.  Review your medicines with your doctor. Some medicines can make you feel dizzy. This can increase your chance of falling. Ask your doctor what other things that you can do to help prevent falls. This information is not intended to replace advice given to you by your health care provider. Make sure you discuss any questions you have with your health care provider. Document Released: 08/10/2009 Document Revised: 03/21/2016 Document Reviewed: 11/18/2014 Elsevier Interactive Patient Education  2017 [employer].

## 2017-04-10 NOTE — Progress Notes (Signed)
Subjective:   [name] Rice is a 83 y.o. male who presents for an Initial Medicare Annual Wellness Visit at [facility]   Objective:    Today's Vitals   04/10/17 1234  BP: 102/63  Pulse: 71  Temp: 98.2 F (36.8 C)  TempSrc: Oral  SpO2: 94%  Weight: 119 lb (54 kg)  Height: 5' (1.524 m)   Body mass index is 23.24 kg/m.  Current Medications (verified) Outpatient Encounter Prescriptions as of 04/10/2017  Medication Sig  . capsaicin (ZOSTRIX) 0.025 % cream Apply 1 application topically daily as needed.  . carbamide peroxide (DEBROX) 6.5 % otic solution Place 5 drops into both ears daily as needed (wax bld up).  . donepezil (ARICEPT) 10 MG tablet Take 1 tablet (10 mg total) by mouth at bedtime.  . fluticasone (FLONASE) 50 MCG/ACT nasal spray Place 2 sprays into both nostrils daily.  Marland Kitchen guaiFENesin (MUCINEX) 600 MG 12 hr tablet Take 600 mg by mouth 2 (two) times daily.  Marland Kitchen loratadine (CLARITIN) 10 MG tablet Take 10 mg by mouth daily.  . memantine (NAMENDA XR) 14 MG CP24 24 hr capsule Take 14 mg by mouth daily.  . montelukast (SINGULAIR) 10 MG tablet Take 10 mg by mouth at bedtime.  . Multiple Vitamins-Minerals (MULTIVITAMIN) tablet Take 1 tablet by mouth daily.  . Nutritional Supplements (NUTRITIONAL SUPPLEMENT PO) Take 90 mLs by mouth 2 (two) times daily. Med Pass  . QUEtiapine (SEROQUEL) 25 MG tablet Take 12.5 mg by mouth at bedtime.   . sertraline (ZOLOFT) 25 MG tablet Take 25 mg by mouth daily.   No facility-administered encounter medications on file as of 04/10/2017.     Allergies (verified) Patient has no known allergies.   History: Past Medical History:  Diagnosis Date  . Arthritis   . Cancer [facility])    prostate   . Dementia   . Diabetes mellitus without complication ([place])   . Difficulty urinating   . Essential hypertension, benign 11/17/2015  . Hearing loss   . Nasal congestion   . Stroke ([place])   . 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  Weakness    Past Surgical History:  Procedure Laterality Date  . HERNIA REPAIR  2012   Family History  Problem Relation Age of Onset  . Heart attack Father   . Diabetes Brother   . Hypertension Brother    Social History   Occupational History  . Not on file.   Social History Main Topics  . Smoking status: Current Every Day Smoker    Packs/day: 0.25    Years: 20.00  . Smokeless tobacco: Never Used  . Alcohol use Yes     Comment: daily  . Drug use: No  . Sexual activity: No   Tobacco Counseling Ready to quit: Not Answered Counseling given: Not Answered   Activities of Daily Living In your present state of health, do you have any difficulty performing the following activities: 04/10/2017  Hearing? Y  Vision? N  Difficulty concentrating or making decisions? Y  Walking or climbing stairs? Y  Dressing or bathing? Y  Doing errands, shopping? Y  Preparing Food and eating ? Y  Using the Toilet? Y  In the past six months, have you accidently leaked urine? N  Do you have problems with loss of bowel control? N  Managing your Medications? Y  Managing your Finances? Y  Housekeeping or managing your Housekeeping? Y  Some recent data  might be hidden    Immunizations and Health Maintenance Immunization History  Administered Date(s) Administered  . Influenza Whole 09/28/2010  . Influenza,inj,Quad PF,36+ Mos 07/16/2013, 07/26/2015   There are no preventive care reminders to display for this patient.  Patient Care Team: [place] [place] [facility]  Indicate any recent Medical Services you may have received from other than [facility] providers in the past year (date may be approximate).    Assessment:   This is a routine wellness examination for [place].   Hearing/Vision screen No exam data present  Dietary issues and exercise activities discussed: Current Exercise Habits: Home exercise routine, Type of exercise: stretching;walking, Time (Minutes):  15, Frequency (Times/Week): 4, Weekly Exercise (Minutes/Week): 60, Intensity: Mild, Exercise limited by: None identified  Goals    . Maintain Lifestyle          Pt will maintain lifestyle.       Depression Screen PHQ 2/9 Scores 04/10/2017  PHQ - 2 Score 0    Fall Risk Fall Risk  04/10/2017  Falls in the past year? No    Cognitive Function:     6CIT Screen 04/10/2017  What Year? 0 points  What month? 0 points  What time? 0 points  Count back from 20 0 points  Months in reverse 0 points  Repeat phrase 6 points  Total Score 6    Screening Tests Health Maintenance  Topic Date Due  . INFLUENZA VACCINE  01/01/2018 (Originally 05/28/2017)  . TETANUS/TDAP  12/14/2043 (Originally 03/24/1944)  . PNA vac Low Risk Adult (1 of 2 - PCV13) 12/14/2043 (Originally 03/24/1992)        Plan:    I have personally reviewed and addressed the Medicare Annual Wellness questionnaire and have noted the following in the patient's chart:  A. Medical and social history B. Use of alcohol, tobacco or illicit drugs  C. Current medications and supplements D. Functional ability and status E.  Nutritional status F.  Physical activity G. Advance directives H. List of other physicians I.  Hospitalizations, surgeries, and ER visits in previous 12 months J.  [place] to include hearing, vision, cognitive, depression L. Referrals and appointments - none  In addition, I have reviewed and discussed with patient certain preventive protocols, quality metrics, and best practice recommendations. A written personalized care plan for preventive services as well as general preventive health recommendations were provided to patient.  See attached scanned questionnaire for additional information.   Signed,   Rich Reining, RN Nurse Health Advisor   Quick Notes   Health Maintenance: PNA 13 due     Abnormal Screen: 6 CIT-6     Patient Concerns: None     Nurse Concerns: None

## 2017-09-23 IMAGING — CT CT ABD-PELV W/ CM
2 of 5 series · 16 of 46 positions shown, 18 images · IV contrast (Omni 300)
Comparison: CT dated 06/01/2015

CLINICAL DATA: 89-year-old male with abdominal pain and episodes of
nausea vomiting and constipation.

EXAM:
CT ABDOMEN AND PELVIS WITH CONTRAST
TECHNIQUE: Multidetector CT imaging of the abdomen and pelvis was performed
using the standard protocol following bolus administration of
intravenous contrast.
CONTRAST:  80mL OMNIPAQUE IOHEXOL 300 MG/ML  SOLN

[Series 2: a/p w/ 5mm · axial · 0.74mm/px · z∈[+1114,+1474]mm · 13 of 82 slices shown, 15 images]
[im 5/82  soft-tissue]
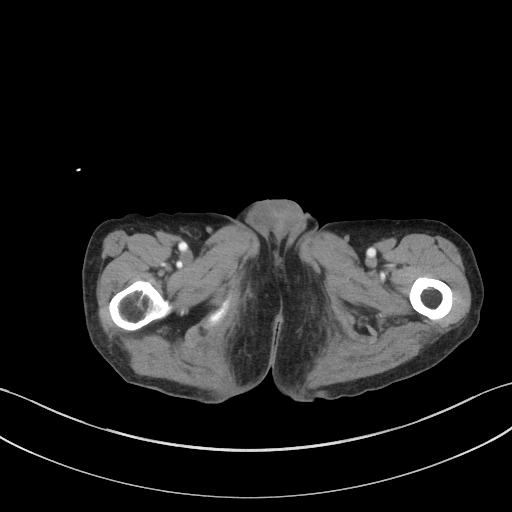
[im 5/82  bone]
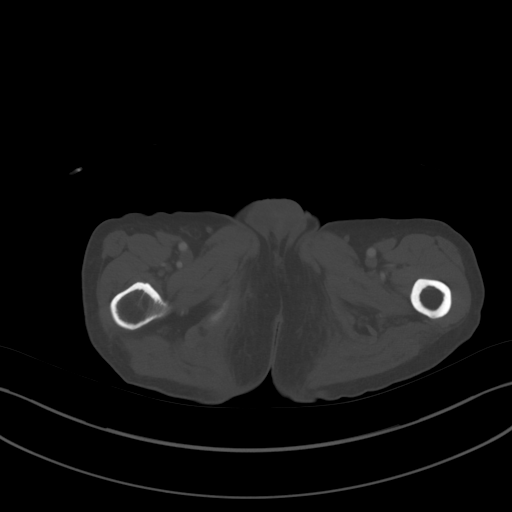
[im 13/82  soft-tissue]
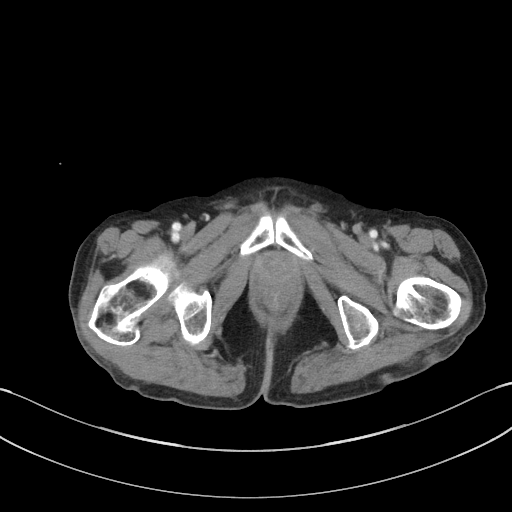
[im 18/82  soft-tissue]
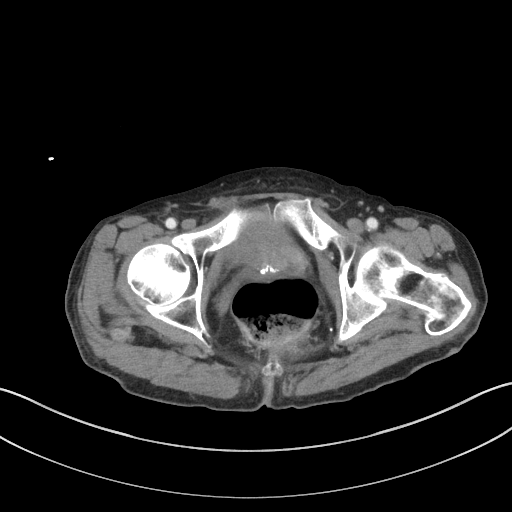
[im 22/82  soft-tissue]
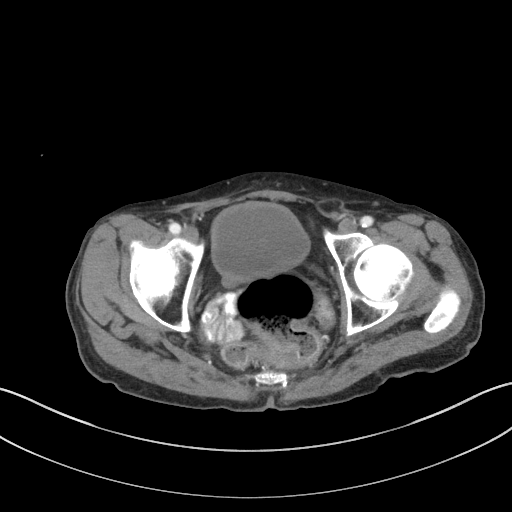
[im 30/82  soft-tissue]
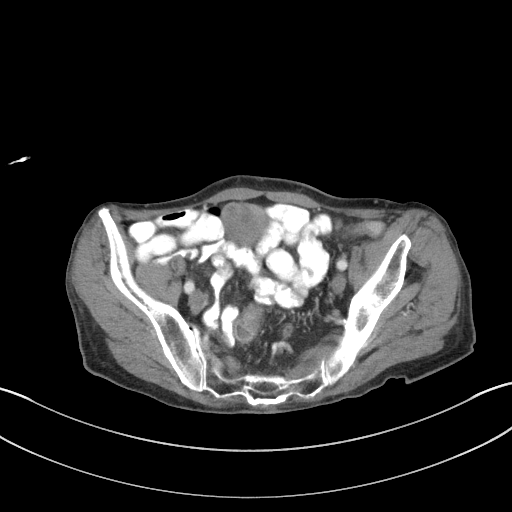
[im 35/82  soft-tissue]
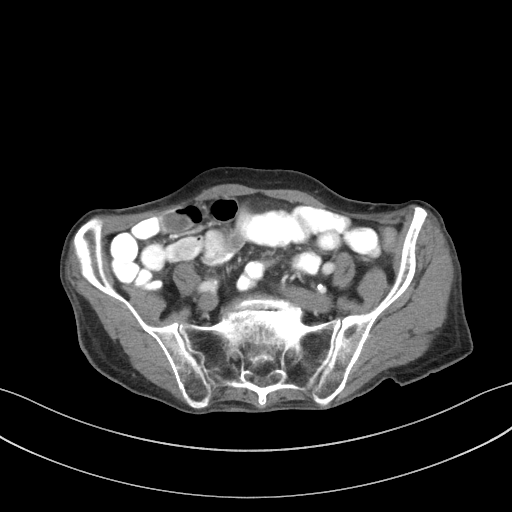
[im 43/82  soft-tissue]
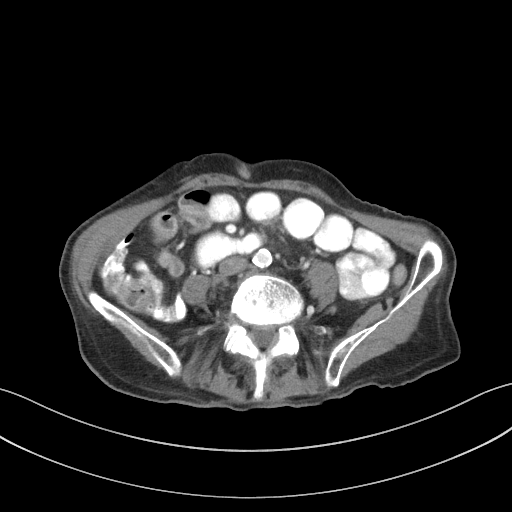
[im 47/82  soft-tissue]
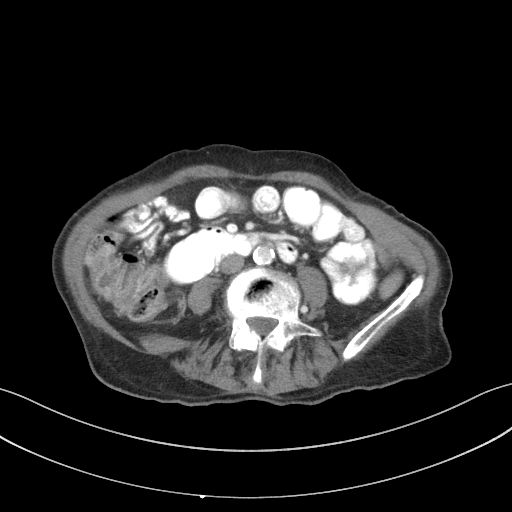
[im 52/82  soft-tissue]
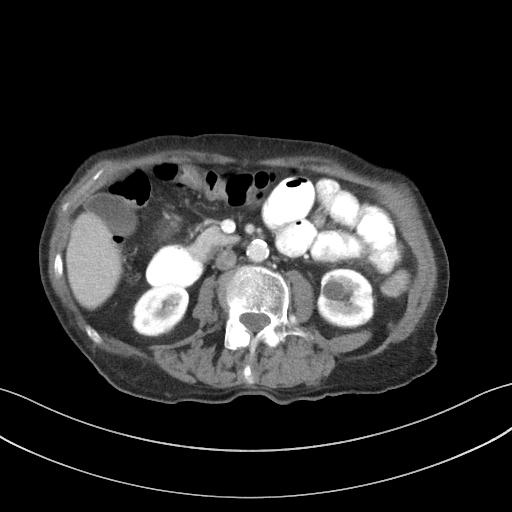
[im 52/82  bone]
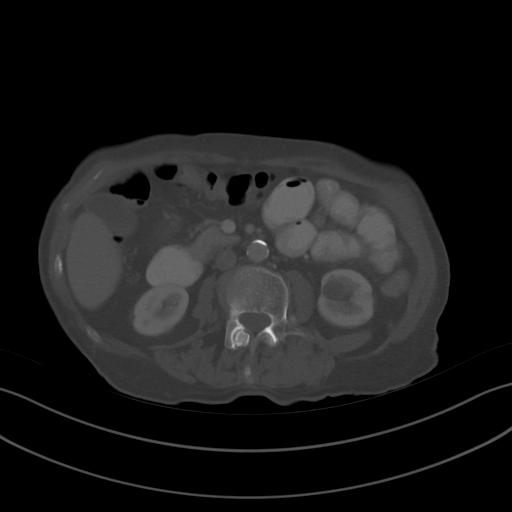
[im 60/82  soft-tissue]
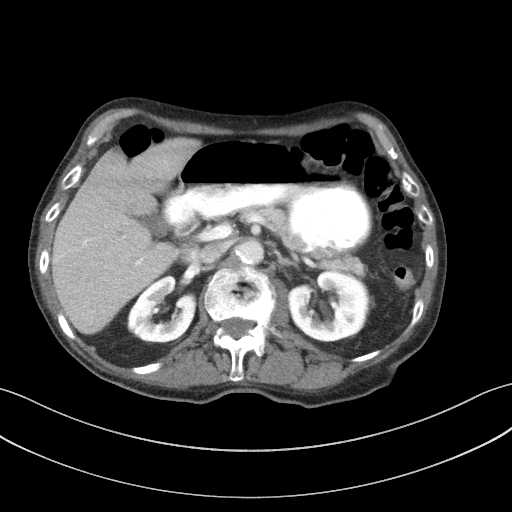
[im 64/82  soft-tissue]
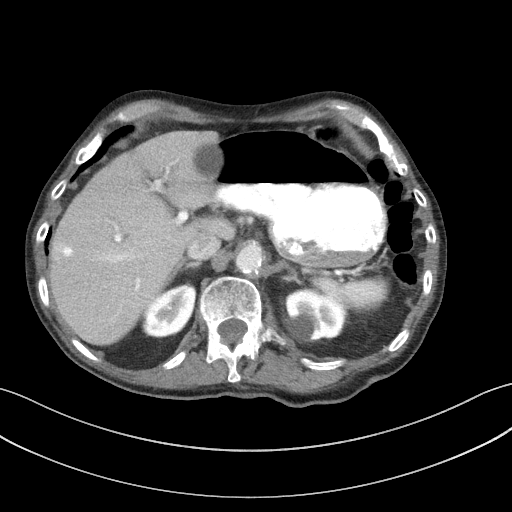
[im 69/82  soft-tissue]
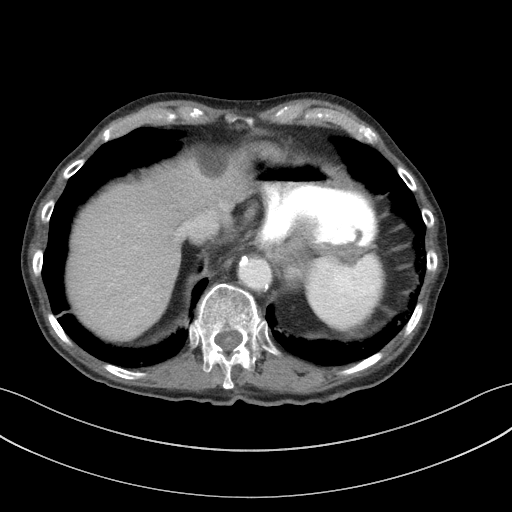
[im 77/82  soft-tissue]
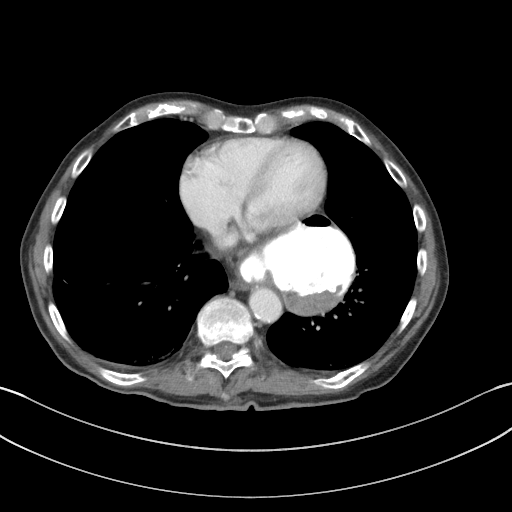

[Series 5: a/p w/ cor · coronal · 0.70mm/px · 3 of 113 slices shown]
[im 38/113  soft-tissue]
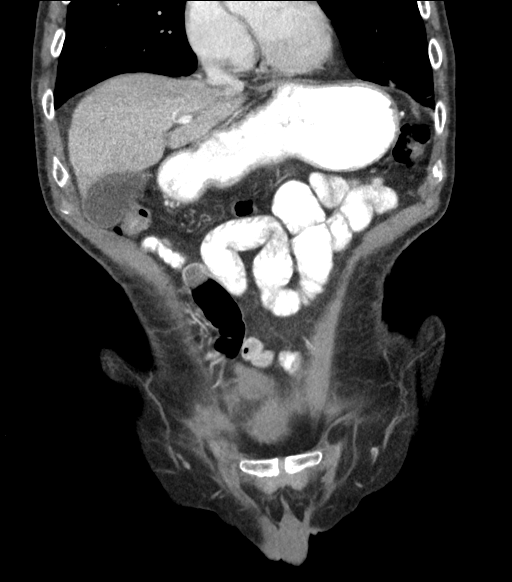
[im 50/113  soft-tissue]
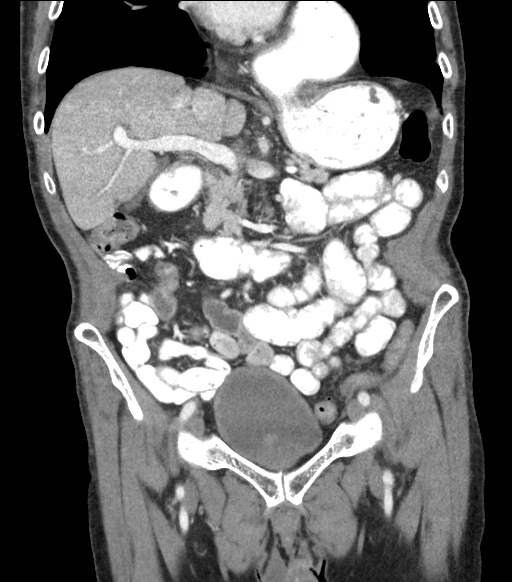
[im 63/113  soft-tissue]
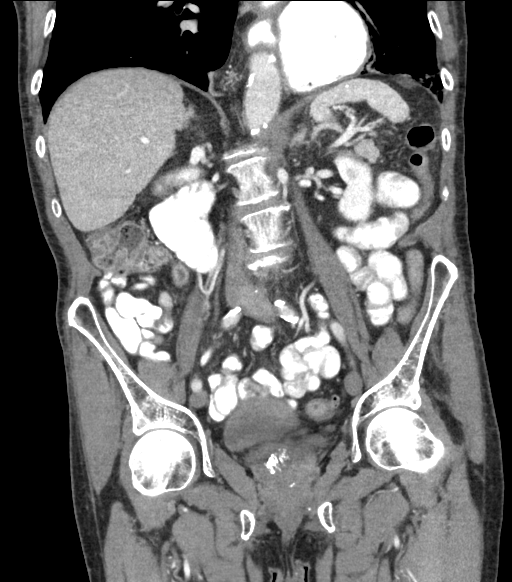

[16 of 46 positions shown; findings below may reference images not displayed]

FINDINGS: Bibasilar subsegmental linear atelectasis/ scarring. The visualized
lung bases are otherwise clear. There is coronary vascular
calcification. No intra-abdominal free air or free fluid.

Stable appearing left hepatic cystic lesion. Stable appearing
lobulated vascular lesion in the periphery of the right lobe of the
liver most compatible with a portal venous shunting. The
gallbladder, pancreas, spleen, and the adrenal glands appear
unremarkable. Stable appearing left renal hypodense lesions, likely
cysts. Small left renal parapelvic cysts noted. There is a 3 mm
nonobstructing right renal interpolar calculus. No hydronephrosis on
either side. The visualized ureters appear unremarkable. There is
diffuse thickening and trabecular appearance of the bladder wall
compatible with chronic bladder outlet obstruction. The prostate
gland is enlarged with median lobe hypertrophy.

There is a large hiatal hernia with gastroesophageal reflux. Oral
contrast opacifies the stomach and multiple loops of small bowel.
There is no evidence of bowel obstruction or inflammation. There is
apparent diffuse thickening of the wall of the colon predominantly
involving the rectosigmoid which may be related to underdistention.
Colitis is not excluded. Clinical correlation is recommended. There
is sigmoid diverticulosis without active inflammation. Normal
appendix.

Advanced aortoiliac atherosclerotic disease. The origins of the
celiac axis, SMA, IMA as well as the origins of the renal arteries
appear patent. No portal venous gas identified. There is no
adenopathy.

The abdominal wall soft tissues appear unremarkable. There is
osteopenia with extensive degenerative changes of the spine. Old
healed left posterior rib fractures. No acute fracture identified.
There are bilateral L5 pars defects with grade 1 L5-S1
anterolisthesis. There is T12 compression fractures similar to prior
study.
IMPRESSION: Under distention versus colitis. Correlation with clinical exam and
stool cultures recommended. No bowel obstruction. Normal appendix.

Moderate-sized hiatal hernia.

Diffuse bladder wall thickening sequela of chronic bladder outlet
obstruction. Correlation with urinalysis recommended to exclude
superimposed UTI.

Other findings similar to the prior CT.

## 2017-10-12 IMAGING — CR DG ABDOMEN ACUTE W/ 1V CHEST
3 series · 3 of 3 positions shown · non-contrast
Comparison: CT 4 days prior 11/02/2015

CLINICAL DATA: Abdominal pain. Bowel movement 2 days ago. Patient
reports inguinal pain.

EXAM:
DG ABDOMEN ACUTE W/ 1V CHEST

[w chest pa]
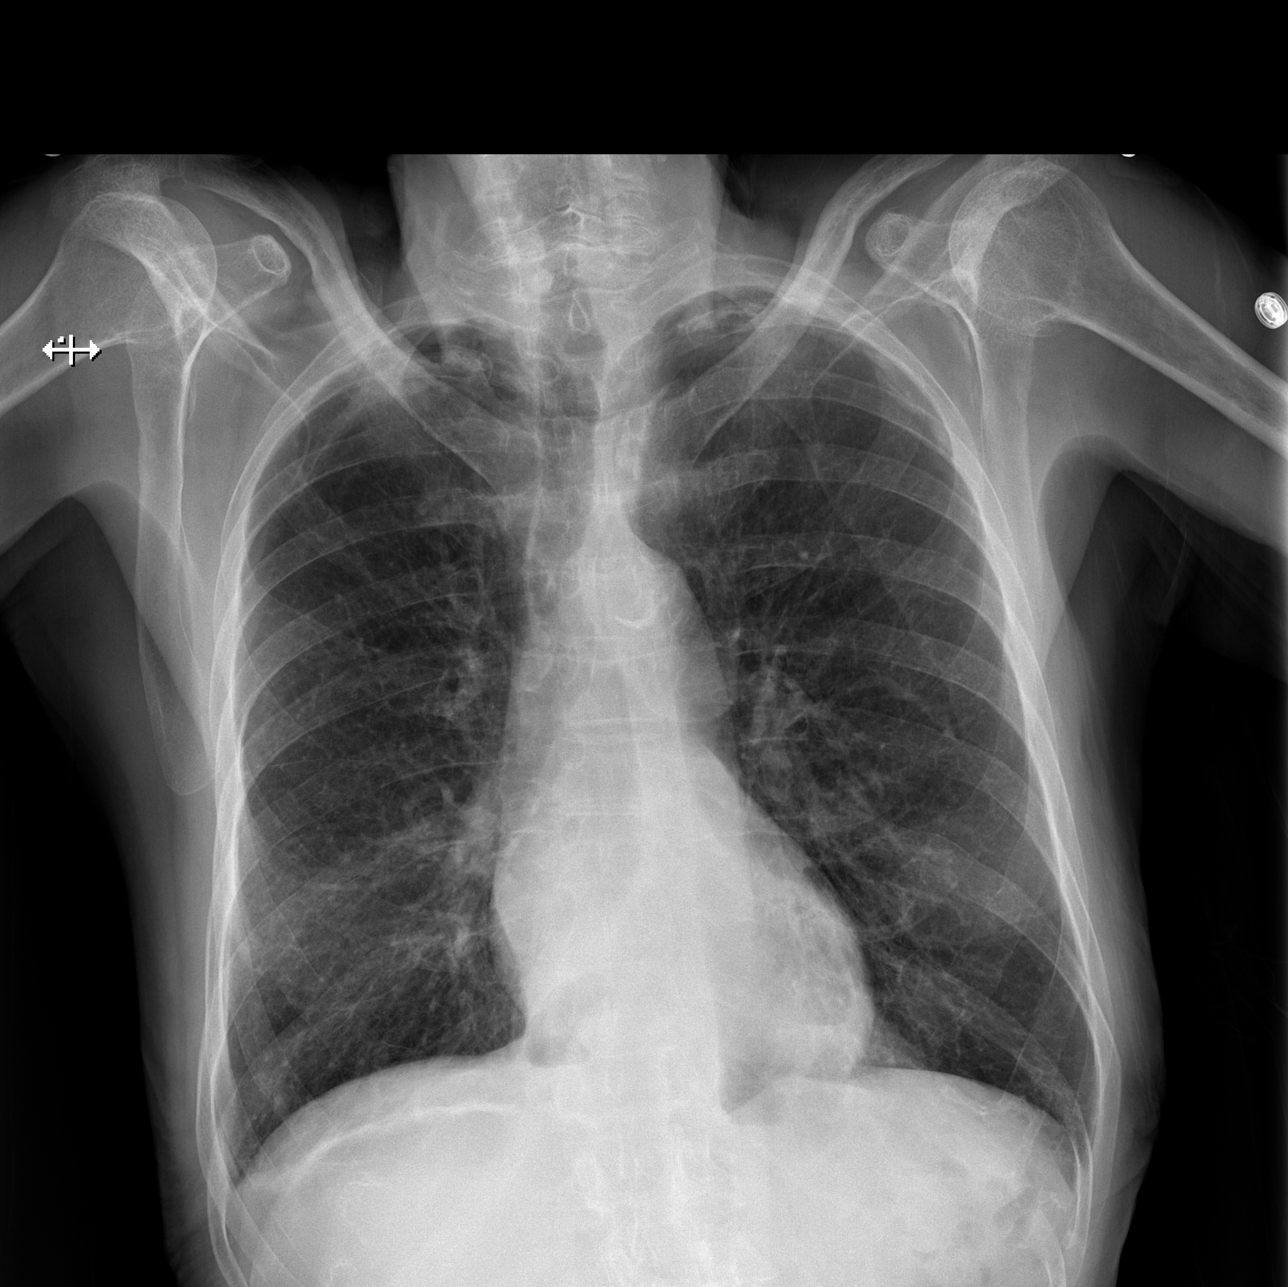

[w abdomen upright]
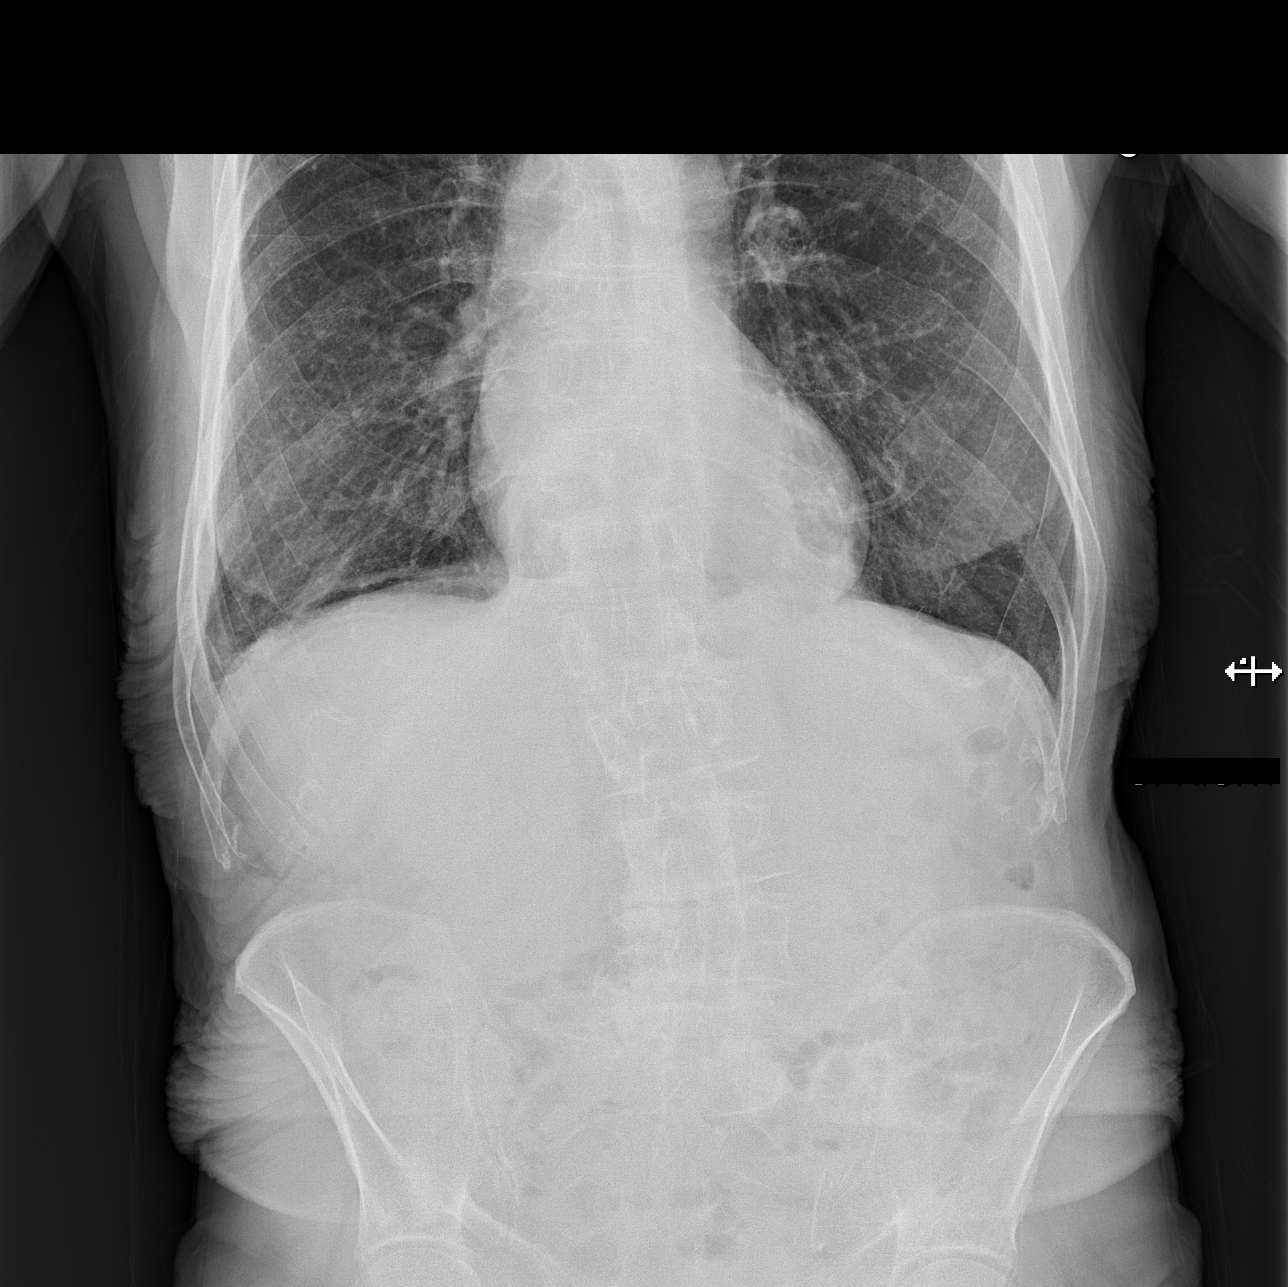

[t abdomen supine]
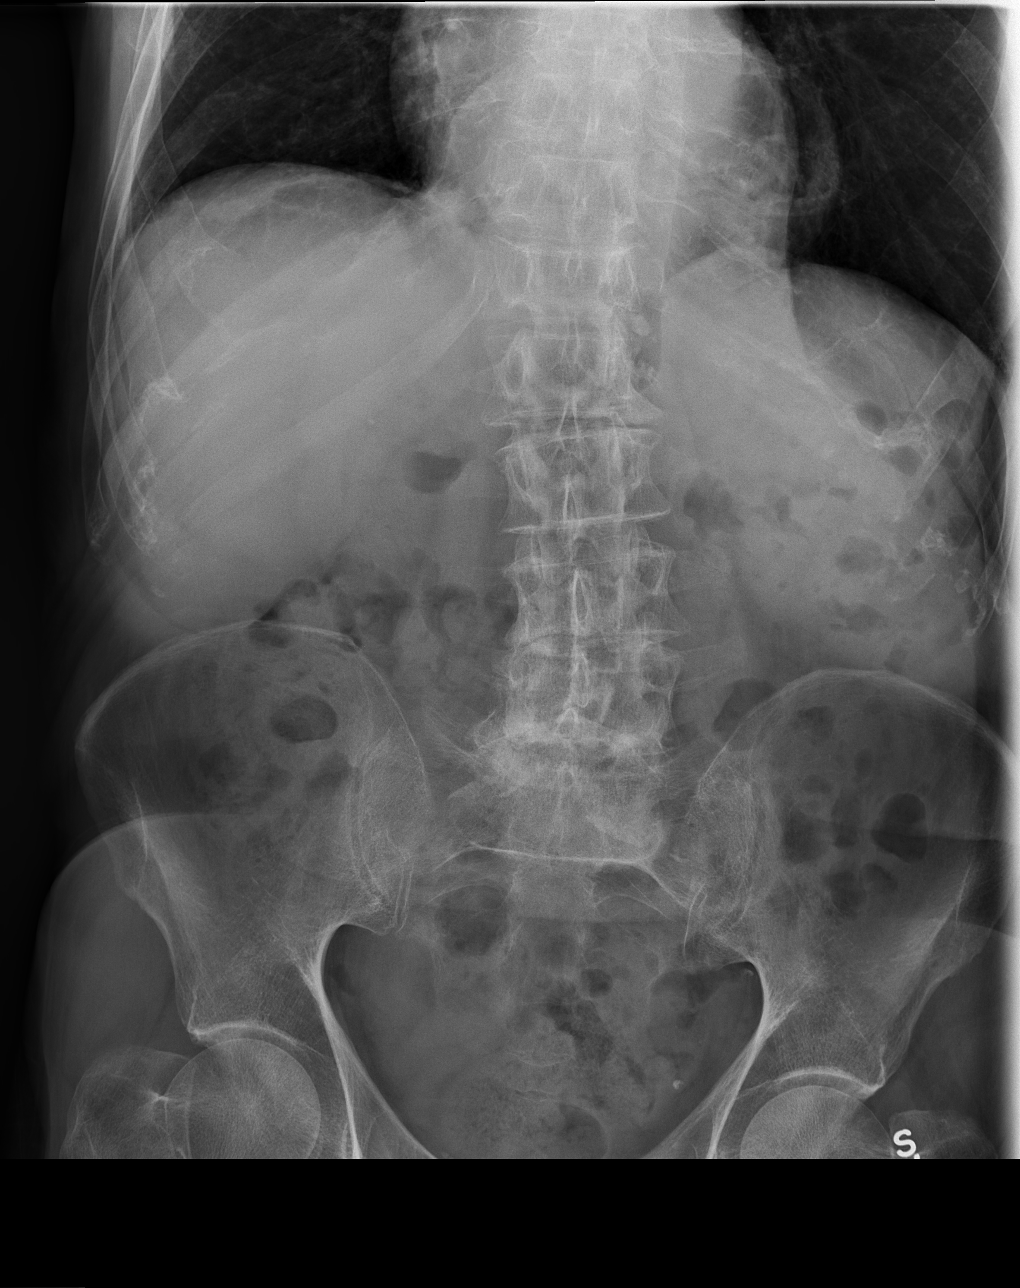

[3 of 3 positions shown; findings below may reference images not displayed]

FINDINGS: Heart size is normal, mild tortuosity and atherosclerosis of the
thoracic aorta. Retrocardiac hiatal hernia again seen. The lungs are
clear.

There is no free intra-abdominal air. No dilated bowel loops to
suggest obstruction. Small volume of stool throughout the colon.
Small calculus again projects over the right renal bed. No acute
osseous abnormalities are seen. There is scoliotic curvature of the
spine.
IMPRESSION: No bowel obstruction or free air. Retrocardiac hiatal hernia. Right
nephrolithiasis again seen.

## 2019-12-12 ENCOUNTER — Encounter (HOSPITAL_COMMUNITY): Payer: Self-pay

## 2019-12-12 ENCOUNTER — Emergency Department (HOSPITAL_COMMUNITY): Payer: Medicare (Managed Care)

## 2019-12-12 ENCOUNTER — Other Ambulatory Visit: Payer: Self-pay

## 2019-12-12 ENCOUNTER — Inpatient Hospital Stay (HOSPITAL_COMMUNITY)
Admission: EM | Admit: 2019-12-12 | Discharge: 2019-12-16 | DRG: 871 | Disposition: A | Discharge status: Hospice | Payer: Medicare (Managed Care) | Attending: Internal Medicine | Admitting: Internal Medicine

## 2019-12-12 DIAGNOSIS — I10 Essential (primary) hypertension: Secondary | ICD-10-CM | POA: Diagnosis present

## 2019-12-12 DIAGNOSIS — G309 Alzheimer's disease, unspecified: Secondary | ICD-10-CM | POA: Diagnosis present

## 2019-12-12 DIAGNOSIS — Z515 Encounter for palliative care: Secondary | ICD-10-CM | POA: Diagnosis present

## 2019-12-12 DIAGNOSIS — F0280 Dementia in other diseases classified elsewhere without behavioral disturbance: Secondary | ICD-10-CM | POA: Diagnosis present

## 2019-12-12 DIAGNOSIS — F17210 Nicotine dependence, cigarettes, uncomplicated: Secondary | ICD-10-CM | POA: Diagnosis present

## 2019-12-12 DIAGNOSIS — N179 Acute kidney failure, unspecified: Secondary | ICD-10-CM | POA: Diagnosis present

## 2019-12-12 DIAGNOSIS — R4182 Altered mental status, unspecified: Secondary | ICD-10-CM | POA: Diagnosis present

## 2019-12-12 DIAGNOSIS — R0902 Hypoxemia: Secondary | ICD-10-CM | POA: Diagnosis present

## 2019-12-12 DIAGNOSIS — Z8673 Personal history of transient ischemic attack (TIA), and cerebral infarction without residual deficits: Secondary | ICD-10-CM | POA: Diagnosis not present

## 2019-12-12 DIAGNOSIS — R7989 Other specified abnormal findings of blood chemistry: Secondary | ICD-10-CM

## 2019-12-12 DIAGNOSIS — Z7189 Other specified counseling: Secondary | ICD-10-CM | POA: Diagnosis not present

## 2019-12-12 DIAGNOSIS — K5289 Other specified noninfective gastroenteritis and colitis: Secondary | ICD-10-CM | POA: Diagnosis present

## 2019-12-12 DIAGNOSIS — M1990 Unspecified osteoarthritis, unspecified site: Secondary | ICD-10-CM | POA: Diagnosis present

## 2019-12-12 DIAGNOSIS — E872 Acidosis, unspecified: Secondary | ICD-10-CM

## 2019-12-12 DIAGNOSIS — H9190 Unspecified hearing loss, unspecified ear: Secondary | ICD-10-CM | POA: Diagnosis present

## 2019-12-12 DIAGNOSIS — R627 Adult failure to thrive: Secondary | ICD-10-CM | POA: Diagnosis present

## 2019-12-12 DIAGNOSIS — E860 Dehydration: Secondary | ICD-10-CM | POA: Diagnosis present

## 2019-12-12 DIAGNOSIS — Z8546 Personal history of malignant neoplasm of prostate: Secondary | ICD-10-CM | POA: Diagnosis not present

## 2019-12-12 DIAGNOSIS — Z8249 Family history of ischemic heart disease and other diseases of the circulatory system: Secondary | ICD-10-CM | POA: Diagnosis not present

## 2019-12-12 DIAGNOSIS — E119 Type 2 diabetes mellitus without complications: Secondary | ICD-10-CM | POA: Diagnosis present

## 2019-12-12 DIAGNOSIS — J1282 Pneumonia due to coronavirus disease 2019: Secondary | ICD-10-CM | POA: Diagnosis present

## 2019-12-12 DIAGNOSIS — Z833 Family history of diabetes mellitus: Secondary | ICD-10-CM

## 2019-12-12 DIAGNOSIS — G301 Alzheimer's disease with late onset: Secondary | ICD-10-CM | POA: Diagnosis not present

## 2019-12-12 DIAGNOSIS — A4189 Other specified sepsis: Principal | ICD-10-CM | POA: Diagnosis present

## 2019-12-12 DIAGNOSIS — Z66 Do not resuscitate: Secondary | ICD-10-CM | POA: Diagnosis present

## 2019-12-12 DIAGNOSIS — R6520 Severe sepsis without septic shock: Secondary | ICD-10-CM | POA: Diagnosis present

## 2019-12-12 DIAGNOSIS — U071 COVID-19: Secondary | ICD-10-CM | POA: Diagnosis present

## 2019-12-12 DIAGNOSIS — J159 Unspecified bacterial pneumonia: Secondary | ICD-10-CM | POA: Diagnosis present

## 2019-12-12 DIAGNOSIS — A419 Sepsis, unspecified organism: Secondary | ICD-10-CM | POA: Diagnosis not present

## 2019-12-12 DIAGNOSIS — G9341 Metabolic encephalopathy: Secondary | ICD-10-CM | POA: Diagnosis present

## 2019-12-12 DIAGNOSIS — Z79899 Other long term (current) drug therapy: Secondary | ICD-10-CM

## 2019-12-12 LAB — SARS CORONAVIRUS 2 (TAT 6-24 HRS): SARS Coronavirus 2: POSITIVE — AB

## 2019-12-12 LAB — URINALYSIS, ROUTINE W REFLEX MICROSCOPIC
Bacteria, UA: NONE SEEN
Glucose, UA: NEGATIVE mg/dL
Ketones, ur: 5 mg/dL — AB
Leukocytes,Ua: NEGATIVE
Nitrite: NEGATIVE
Protein, ur: 30 mg/dL — AB
Specific Gravity, Urine: 1.023 (ref 1.005–1.030)
pH: 5 (ref 5.0–8.0)

## 2019-12-12 LAB — COMPREHENSIVE METABOLIC PANEL
ALT: 14 U/L (ref 0–44)
AST: 34 U/L (ref 15–41)
Albumin: 1.8 g/dL — ABNORMAL LOW (ref 3.5–5.0)
Alkaline Phosphatase: 34 U/L — ABNORMAL LOW (ref 38–126)
Anion gap: 16 — ABNORMAL HIGH (ref 5–15)
BUN: 44 mg/dL — ABNORMAL HIGH (ref 8–23)
CO2: 15 mmol/L — ABNORMAL LOW (ref 22–32)
Calcium: 8 mg/dL — ABNORMAL LOW (ref 8.9–10.3)
Chloride: 107 mmol/L (ref 98–111)
Creatinine, Ser: 2.13 mg/dL — ABNORMAL HIGH (ref 0.61–1.24)
GFR calc Af Amer: 30 mL/min — ABNORMAL LOW (ref >60)
GFR calc non Af Amer: 26 mL/min — ABNORMAL LOW (ref >60)
Glucose, Bld: 92 mg/dL (ref 70–99)
Potassium: 5.1 mmol/L (ref 3.5–5.1)
Sodium: 138 mmol/L (ref 135–145)
Total Bilirubin: 0.8 mg/dL (ref 0.3–1.2)
Total Protein: 3.7 g/dL — ABNORMAL LOW (ref 6.5–8.1)

## 2019-12-12 LAB — CBC WITH DIFFERENTIAL/PLATELET
Abs Immature Granulocytes: 0.05 10*3/uL (ref 0.00–0.07)
Basophils Absolute: 0 10*3/uL (ref 0.0–0.1)
Basophils Relative: 0 %
Eosinophils Absolute: 0 10*3/uL (ref 0.0–0.5)
Eosinophils Relative: 0 %
HCT: 50.4 % (ref 39.0–52.0)
Hemoglobin: 16.9 g/dL (ref 13.0–17.0)
Immature Granulocytes: 0 %
Lymphocytes Relative: 7 %
Lymphs Abs: 1.2 10*3/uL (ref 0.7–4.0)
MCH: 32.1 pg (ref 26.0–34.0)
MCHC: 33.5 g/dL (ref 30.0–36.0)
MCV: 95.6 fL (ref 80.0–100.0)
Monocytes Absolute: 1 10*3/uL (ref 0.1–1.0)
Monocytes Relative: 6 %
Neutro Abs: 14.3 10*3/uL — ABNORMAL HIGH (ref 1.7–7.7)
Neutrophils Relative %: 87 %
Platelets: 279 10*3/uL (ref 150–400)
RBC: 5.27 MIL/uL (ref 4.22–5.81)
RDW: 14.8 % (ref 11.5–15.5)
WBC Morphology: INCREASED
WBC: 16.5 10*3/uL — ABNORMAL HIGH (ref 4.0–10.5)
nRBC: 0 % (ref 0.0–0.2)

## 2019-12-12 LAB — LACTIC ACID, PLASMA
Lactic Acid, Venous: 5.3 mmol/L (ref 0.5–1.9)
Lactic Acid, Venous: 8.6 mmol/L (ref 0.5–1.9)
Lactic Acid, Venous: 3.1 mmol/L (ref 0.5–1.9)
Lactic Acid, Venous: 2.5 mmol/L (ref 0.5–1.9)

## 2019-12-12 LAB — VITAMIN B12: Vitamin B-12: 461 pg/mL (ref 180–914)

## 2019-12-12 LAB — SEDIMENTATION RATE: Sed Rate: 23 mm/hr — ABNORMAL HIGH (ref 0–16)

## 2019-12-12 LAB — SODIUM, URINE, RANDOM: Sodium, Ur: <10 mmol/L

## 2019-12-12 LAB — RPR: RPR Ser Ql: NONREACTIVE

## 2019-12-12 LAB — PROCALCITONIN: Procalcitonin: 31.25 ng/mL

## 2019-12-12 LAB — TROPONIN I (HIGH SENSITIVITY)
Troponin I (High Sensitivity): 20 ng/L — ABNORMAL HIGH (ref <18)
Troponin I (High Sensitivity): 26 ng/L — ABNORMAL HIGH (ref <18)

## 2019-12-12 LAB — PROTEIN / CREATININE RATIO, URINE
Creatinine, Urine: 143.43 mg/dL
Protein Creatinine Ratio: 0.43 mg/mg{Cre} — ABNORMAL HIGH (ref 0.00–0.15)
Total Protein, Urine: 61 mg/dL

## 2019-12-12 LAB — FERRITIN: Ferritin: 85 ng/mL (ref 24–336)

## 2019-12-12 LAB — CK TOTAL AND CKMB (NOT AT ARMC)
CK, MB: 9.3 ng/mL — ABNORMAL HIGH (ref 0.5–5.0)
Relative Index: 1.7 (ref 0.0–2.5)
Total CK: 562 U/L — ABNORMAL HIGH (ref 49–397)

## 2019-12-12 LAB — C-REACTIVE PROTEIN: CRP: 31.7 mg/dL — ABNORMAL HIGH (ref <1.0)

## 2019-12-12 LAB — FIBRINOGEN: Fibrinogen: 633 mg/dL — ABNORMAL HIGH (ref 210–475)

## 2019-12-12 LAB — MAGNESIUM: Magnesium: 1.6 mg/dL — ABNORMAL LOW (ref 1.7–2.4)

## 2019-12-12 LAB — TSH: TSH: 2.057 u[IU]/mL (ref 0.350–4.500)

## 2019-12-12 LAB — LACTATE DEHYDROGENASE: LDH: 231 U/L — ABNORMAL HIGH (ref 98–192)

## 2019-12-12 LAB — LIPASE, BLOOD: Lipase: 15 U/L (ref 11–51)

## 2019-12-12 LAB — D-DIMER, QUANTITATIVE: D-Dimer, Quant: 6.12 ug/mL-FEU — ABNORMAL HIGH (ref 0.00–0.50)

## 2019-12-12 MED ORDER — ACETAMINOPHEN 650 MG RE SUPP: 650.0000 mg | Freq: Four times a day (QID) | RECTAL | Status: DC | PRN

## 2019-12-12 MED ORDER — LACTATED RINGERS IV SOLN: INTRAVENOUS | Status: DC

## 2019-12-12 MED ORDER — HEPARIN SODIUM (PORCINE) 10000 UNIT/ML IJ SOLN
7500.0000 [IU] | Freq: Three times a day (TID) | INTRAMUSCULAR | Status: DC
  Administered 2019-12-13 – 2019-12-14 (×3): 7500 [IU] via SUBCUTANEOUS
  Filled 2019-12-12 (×5): qty 1

## 2019-12-12 MED ORDER — SODIUM CHLORIDE 0.9 % IV SOLN
200.0000 mg | Freq: Once | INTRAVENOUS | Status: AC
  Administered 2019-12-12: 14:00:00 200 mg via INTRAVENOUS
  Filled 2019-12-12: qty 40

## 2019-12-12 MED ORDER — FLUTICASONE PROPIONATE 50 MCG/ACT NA SUSP
2.0000 | Freq: Every day | NASAL | Status: DC
  Administered 2019-12-12 – 2019-12-14 (×3): 2 via NASAL
  Filled 2019-12-12 (×2): qty 16

## 2019-12-12 MED ORDER — VANCOMYCIN HCL IN DEXTROSE 1-5 GM/200ML-% IV SOLN
1000.0000 mg | Freq: Once | INTRAVENOUS | Status: AC
  Administered 2019-12-12: 1000 mg via INTRAVENOUS
  Filled 2019-12-12: qty 200

## 2019-12-12 MED ORDER — SODIUM CHLORIDE 0.9 % IV SOLN: INTRAVENOUS | Status: DC

## 2019-12-12 MED ORDER — MAGNESIUM SULFATE 2 GM/50ML IV SOLN
2.0000 g | Freq: Once | INTRAVENOUS | Status: AC
  Administered 2019-12-12: 2 g via INTRAVENOUS
  Filled 2019-12-12: qty 50

## 2019-12-12 MED ORDER — PIPERACILLIN-TAZOBACTAM 3.375 G IVPB 30 MIN
3.3750 g | Freq: Once | INTRAVENOUS | Status: AC
  Administered 2019-12-12: 3.375 g via INTRAVENOUS
  Filled 2019-12-12: qty 50

## 2019-12-12 MED ORDER — ONDANSETRON HCL 4 MG/2ML IJ SOLN: 4.0000 mg | Freq: Four times a day (QID) | INTRAMUSCULAR | Status: DC | PRN

## 2019-12-12 MED ORDER — ONDANSETRON HCL 4 MG PO TABS: 4.0000 mg | ORAL_TABLET | Freq: Four times a day (QID) | ORAL | Status: DC | PRN

## 2019-12-12 MED ORDER — SODIUM CHLORIDE 0.9 % IV SOLN
100.0000 mg | Freq: Every day | INTRAVENOUS | Status: DC
  Administered 2019-12-13 – 2019-12-15 (×3): 100 mg via INTRAVENOUS
  Filled 2019-12-12 (×3): qty 20

## 2019-12-12 MED ORDER — HYDROCORTISONE NA SUCCINATE PF 100 MG IJ SOLR
50.0000 mg | Freq: Four times a day (QID) | INTRAMUSCULAR | Status: DC
  Administered 2019-12-12 – 2019-12-14 (×9): 50 mg via INTRAVENOUS
  Filled 2019-12-12 (×8): qty 2

## 2019-12-12 MED ORDER — QUETIAPINE FUMARATE 25 MG PO TABS
12.5000 mg | ORAL_TABLET | Freq: Every day | ORAL | Status: DC
  Filled 2019-12-12 (×2): qty 1

## 2019-12-12 MED ORDER — MONTELUKAST SODIUM 10 MG PO TABS
10.0000 mg | ORAL_TABLET | Freq: Every day | ORAL | Status: DC
  Filled 2019-12-12 (×2): qty 1

## 2019-12-12 MED ORDER — PIPERACILLIN-TAZOBACTAM 3.375 G IVPB
3.3750 g | Freq: Three times a day (TID) | INTRAVENOUS | Status: DC
  Administered 2019-12-12 – 2019-12-13 (×3): 3.375 g via INTRAVENOUS
  Filled 2019-12-12 (×3): qty 50

## 2019-12-12 MED ORDER — TRAZODONE HCL 50 MG PO TABS
50.0000 mg | ORAL_TABLET | Freq: Every day | ORAL | Status: DC
  Filled 2019-12-12: qty 1

## 2019-12-12 MED ORDER — LACTATED RINGERS IV BOLUS
1000.0000 mL | Freq: Once | INTRAVENOUS | Status: AC
  Administered 2019-12-12: 04:00:00 1000 mL via INTRAVENOUS

## 2019-12-12 MED ORDER — SODIUM CHLORIDE 0.9% FLUSH
3.0000 mL | Freq: Two times a day (BID) | INTRAVENOUS | Status: DC
  Administered 2019-12-12 – 2019-12-16 (×8): 3 mL via INTRAVENOUS

## 2019-12-12 MED ORDER — ENOXAPARIN SODIUM 30 MG/0.3ML ~~LOC~~ SOLN
30.0000 mg | SUBCUTANEOUS | Status: DC
  Administered 2019-12-12: 30 mg via SUBCUTANEOUS
  Filled 2019-12-12: qty 0.3

## 2019-12-12 MED ORDER — MEMANTINE HCL ER 7 MG PO CP24
14.0000 mg | ORAL_CAPSULE | Freq: Every day | ORAL | Status: DC
  Filled 2019-12-12 (×2): qty 2
  Filled 2019-12-12: qty 1

## 2019-12-12 MED ORDER — LACTATED RINGERS IV BOLUS
1000.0000 mL | Freq: Once | INTRAVENOUS | Status: AC
  Administered 2019-12-12: 1000 mL via INTRAVENOUS

## 2019-12-12 MED ORDER — POLYETHYLENE GLYCOL 3350 17 G PO PACK: 17.0000 g | PACK | Freq: Every day | ORAL | Status: DC | PRN

## 2019-12-12 MED ORDER — VANCOMYCIN HCL 500 MG/100ML IV SOLN
500.0000 mg | INTRAVENOUS | Status: DC
  Administered 2019-12-14: 500 mg via INTRAVENOUS
  Filled 2019-12-12: qty 100

## 2019-12-12 MED ORDER — CHLORHEXIDINE GLUCONATE CLOTH 2 % EX PADS
6.0000 | MEDICATED_PAD | Freq: Every day | CUTANEOUS | Status: DC
  Administered 2019-12-13 – 2019-12-15 (×3): 6 via TOPICAL

## 2019-12-12 MED ORDER — DOCUSATE SODIUM 100 MG PO CAPS
100.0000 mg | ORAL_CAPSULE | Freq: Two times a day (BID) | ORAL | Status: DC
  Filled 2019-12-12 (×3): qty 1

## 2019-12-12 MED ORDER — ACETAMINOPHEN 325 MG PO TABS: 650.0000 mg | ORAL_TABLET | Freq: Four times a day (QID) | ORAL | Status: DC | PRN

## 2019-12-12 NOTE — ED Notes (Signed)
Daughter updated 

## 2019-12-12 NOTE — H&P (Signed)
History and Physical    [name] Rice [id number] DOB: 1925-01-13 DOA: 12/12/2019  PCP: Caprice Renshaw, MD Consultants:  None Patient coming from: [place]; NOK:  [name] Rice, [place] legal guardian, [phone]  Chief Complaint: AMS  HPI: [name] Rice is a 86 y.o. male with medical history significant of dementia; remote ETOH dependence; CVA; HTN; DM; and prostate cancer presenting with AMS.  Per EMS, he is usually alert and ambulatory.  He is obtunded and unable to answer any questions at this time.  Patient has a signed and scanned advanced directive listing [name] Rice as POA.  It says "no life support".  Additionally, he specifically requested no life-prolonged measures if he has advanced dementia that is not likely to improve.  This was signed on 02/07/19.  ED Course: Probably septic, from a nursing home.  Presented with AMS, patient is nonverbal.  Abd TTP - CT with ?RLL infiltrate and stercoral proctitis.  Lactate 5 -> 8; AKI with creatinine 2.  Given Vanc and Zosyn.  DNR.  Review of Systems: Unable to perform   PMH, PSH, FH, and SH were reviewed in Epic and on SNF paperwork   Past Medical History:  Diagnosis Date  . Arthritis   . Cancer [name])    prostate   . Dementia ([place])   . Diabetes mellitus without complication ([place])   . Difficulty urinating   . Essential hypertension, benign 11/17/2015  . Hearing loss   . Nasal congestion   . Stroke ([place])   . Substance abuse ([place])    alcohol -pt is 60 days sober  . Weakness     Past Surgical History:  Procedure Laterality Date  . HERNIA REPAIR  2012    Social History   Socioeconomic History  . Marital status: Widowed    Spouse name: Not on file  . Number of children: Not on file  . Years of education: Not on file  . Highest education level: Not on file  Occupational History  . Not on file  Tobacco Use  . Smoking status: Current Every Day Smoker    Packs/day: 0.25    Years: 20.00    Pack years: 5.00  .  Smokeless tobacco: Never Used  Substance and Sexual Activity  . Alcohol use: Yes    Comment: daily  . Drug use: No  . Sexual activity: Never  Other Topics Concern  . Not on file  Social History Narrative  . Not on file   Social Determinants of Health   Financial Resource Strain:   . Difficulty of Paying Living Expenses: Not on file  Food Insecurity:   . Worried About Charity fundraiser in the Last Year: Not on file  . Ran Out of Food in the Last Year: Not on file  Transportation Needs:   . Lack of Transportation (Medical): Not on file  . Lack of Transportation (Non-Medical): Not on file  Physical Activity:   . Days of Exercise per Week: Not on file  . Minutes of Exercise per Session: Not on file  Stress:   . Feeling of Stress : Not on file  Social Connections:   . Frequency of Communication with Friends and Family: Not on file  . Frequency of Social Gatherings with Friends and Family: Not on file  . Attends Religious Services: Not on file  . Active Member of Clubs or Organizations: Not on file  . Attends Archivist Meetings: Not on file  . Marital Status: Not on file  Intimate Partner Violence:   . Fear of Current or Ex-Partner: Not on file  . Emotionally Abused: Not on file  . Physically Abused: Not on file  . Sexually Abused: Not on file    No Known Allergies  Family History  Problem Relation Age of Onset  . Heart attack Father   . Diabetes Brother   . Hypertension Brother     Prior to Admission medications   Medication Sig Start Date End Date Taking? Authorizing Provider  acetaminophen (TYLENOL) 325 MG tablet Take 650 mg by mouth every morning.   Yes [provider]  Ensure (ENSURE) Take 120 mLs by mouth 3 (three) times daily between meals.   Yes [provider]  fluticasone (FLONASE) 50 MCG/ACT nasal spray Place 2 sprays into both nostrils at bedtime.    Yes [provider]  geriatric multivitamins-minerals  (ELDERTONIC/GEVRABON) LIQD Take 15 mLs by mouth daily.   Yes [provider]  memantine (NAMENDA XR) 14 MG CP24 24 hr capsule Take 14 mg by mouth at bedtime.    Yes [provider]  montelukast (SINGULAIR) 10 MG tablet Take 10 mg by mouth at bedtime.   Yes [provider]  Multiple Vitamins-Minerals (MULTIVITAMIN) tablet Take 1 tablet by mouth daily. 11/07/15  Yes Orlie Dakin, MD  Nutritional Supplements (NUTRITIONAL SUPPLEMENT PO) Take 1 each by mouth 3 (three) times daily with meals. Frozen treat 4 ounces   Yes [provider]  QUEtiapine (SEROQUEL) 25 MG tablet Take 12.5 mg by mouth at bedtime.    Yes [provider]  traZODone (DESYREL) 50 MG tablet Take 50 mg by mouth at bedtime.   Yes [provider]  Vitamin D, Ergocalciferol, (DRISDOL) 1.25 MG (50000 UNIT) CAPS capsule Take 50,000 Units by mouth every 30 (thirty) days. On the 15th of every month   Yes [provider]    Physical Exam: Vitals:   12/12/19 0715 12/12/19 0730 12/12/19 0815 12/12/19 0830  BP: (!) 99/58 107/79 106/82 95/70  Pulse: (!) 103 (!) 106 (!) 107 (!) 106  Resp: (!) 23 (!) 22 (!) 22 (!) 21  Temp:      TempSrc:      SpO2: 100% 100% 100% 100%  Weight:      Height:         . General:  Appears ill, cachectic . Eyes:  Eyes partially open, normal lids, iris . ENT:  Dry mm; pursed lip breathing with Kussmaul breathing . Neck:  no LAD, masses or thyromegaly . Cardiovascular:  RR with mild tachycardia. No LE edema.  Marland Kitchen Respiratory:   Diffuse rhonchi. Mildly increased respiratory effort. . Abdomen:  soft, NT, ND, NABS . Skin:  no rash or induration seen on limited exam . Musculoskeletal:   no bony abnormality . Psychiatric:  Obtunded, GCS 8 . Neurologic:  Unable to perform    Radiological Exams on Admission: CT ABDOMEN PELVIS WO CONTRAST  Result Date: 12/12/2019 CLINICAL DATA:  Lethargic over the last few days. Diverticulitis suspected  clinically. EXAM: CT ABDOMEN AND PELVIS WITHOUT CONTRAST TECHNIQUE: Multidetector CT imaging of the abdomen and pelvis was performed following the standard protocol without IV contrast. COMPARISON:  11/02/2015 FINDINGS: Lower chest: Large hiatal hernia containing the majority of the stomach. Patchy density at the right lung base could represent mild right lower lobe pneumonia. Hepatobiliary: Chronic cyst of the left lobe. No acute liver parenchymal finding. Previously seen vascular anomaly of the lateral aspect of the right lobe not visible without  contrast. Gallbladder is fairly distended but no calcified gallstones are seen. Pancreas: Atrophic changes.  No acute finding. Spleen: Normal Adrenals/Urinary Tract: No adrenal lesion. Simple cyst upper pole left kidney. Simple cyst lower pole left kidney. Few small nonobstructing renal calculi. Catheter in the bladder. The bladder is thick-walled. Stomach/Bowel: Apparent rectal fecal impaction with possible proctitis. Surrounding edema. No sign of diverticulitis. Vascular/Lymphatic: Aortic atherosclerosis. No aneurysm. IVC is normal. No retroperitoneal adenopathy. Reproductive: Negative Other: No free fluid or air. Musculoskeletal: Chronic degenerative changes affect the lumbar spine. Old appearing compression deformities at T9 and T12. IMPRESSION: Large hiatal hernia containing the majority the stomach. Patchy density in the right lower lobe that could represent mild right lower lobe pneumonia. Fecal impaction with wall thickening of the rectum and surrounding stranding suggesting stercoral proctitis. No evidence of diverticulitis. Bladder catheter in place.  Thick-walled bladder. Aortic atherosclerosis. Electronically Signed   By: Nelson Chimes M.D.   On: 12/12/2019 05:56   CT Head Wo Contrast  Result Date: 12/12/2019 CLINICAL DATA:  Altered mental status. EXAM: CT HEAD WITHOUT CONTRAST TECHNIQUE: Contiguous axial images were obtained from the base of the skull  through the vertex without intravenous contrast. COMPARISON:  02/26/2013 FINDINGS: Brain: No evidence of acute infarction, hemorrhage, hydrocephalus, extra-axial collection or mass lesion/mass effect. There is mild diffuse low-attenuation within the subcortical and periventricular white matter compatible with chronic microvascular disease. Prominent sulci and ventricles compatible with brain atrophy. Vascular: No hyperdense vessel or unexpected calcification. Skull: Normal. Negative for fracture or focal lesion. Sinuses/Orbits: No acute finding. Other: None IMPRESSION: 1. No acute intracranial abnormalities. 2. Chronic small vessel ischemic disease and brain atrophy. Electronically Signed   By: Kerby Moors M.D.   On: 12/12/2019 06:04   DG Chest Port 1 View  Result Date: 12/12/2019 CLINICAL DATA:  Altered mental status, lethargy, dementia EXAM: PORTABLE CHEST 1 VIEW COMPARISON:  10/29/2015 FINDINGS: Lungs are essentially clear. Mild platelike scarring/atelectasis at the right lung base. No pleural effusion or pneumothorax. The heart is normal in size. Moderate hiatal hernia. IMPRESSION: No evidence of acute cardiopulmonary disease. Electronically Signed   By: Julian Hy M.D.   On: 12/12/2019 02:20    EKG: Independently reviewed.  Sinus tachycardia with rate 110; RVH with no evidence of acute ischemia   Labs on Admission: I have personally reviewed the available labs and imaging studies at the time of the admission.  Pertinent labs:   CO2 15 BUN 44/Creatinine 2.13/GFR 26 Anion gap 16 Albumin 1.8 CK 562 HS troponin 20 Lactate 5.3, 8.6 UPC 0.43 UA: small Hgb, 5 ketones, 30 protein WBC 16.5   Assessment/Plan Principal Problem:   Sepsis due to undetermined organism [facility]) Active Problems:   Alzheimer's disease ([place])   Essential hypertension, benign   DNR (do not resuscitate)   Sepsis -SIRS criteria in this patient includes: Leukocytosis, tachycardia, tachypnea  -Patient has  evidence of acute organ failure with elevated lactate >2; encephalopathy; creatinine >2 that is not easily explained by another condition. -While awaiting blood cultures, this appears to be a preseptic condition. -Sepsis protocol initiated -Patient had initial lactate >4 and so has received the 30 cc/kg IVF bolus; his repeat lactate was even higher. -Suspected source is RLL PNA and/or stercoral proctitis -Blood and urine cultures pending -Will admit due to:  AMS that is severe or persistent; dehydration that is severe -Treat with IV Vanc/Zosyn for empiric lung and gut coverage in a SNF patient -Will trend lactate to ensure improvement -Will order lower respiratory  tract procalcitonin level.  Antibiotics would not be indicated for PCT <0.1 and probably should not be used for < 0.25.  >0.5 indicates infection and >>0.5 indicates more serious disease.  As the procalcitonin level normalizes, it will be reasonable to consider de-escalation of antibiotic coverage. -Will attempt bowel disimpaction by nursing -This patient is critically ill and has a poor prognosis at this time.  Acute renal failure -Patient is severely dehydrated and his renal function is likely to improve with ongoing hydration  HTN -He no longer appears to be taking medications for this issue  Dementia -If he is able to swallow, will continue home meds - Namenda, Seroquel, and Trazodone  DNR -The patient has advanced directives in his chart, which is extremely helpful -He is a ward of the state and DSS was notified of the admission and the plan to abide by his previously stated wishes -Based on his ACP, he is DNR -Additionally, if he does not respond in a timely manner to the care plan, he has noted that he would not desire ongoing life-prolonging measures and should transition to comfort care -I was able to communicate directly with his legal guardian (despite it being after hours! Her number is listed at the top of my note  and she said that she will be happy to take calls at any time) and confirm our plan of care as well as to notify her of the severity of his condition and his overall poor prognosis at this time.    Note: This patient has been tested and is pending for the novel coronavirus COVID-19.  Notes from his SNF indicate that he may have already been infected with the virus as of 10/29/19.    DVT prophylaxis:  Lovenox Code Status:  DNR - based on ACP paperwork Family Communication: None present; I spoke with his legal guardian at the time of the admission Disposition Plan:  He will d/c back to his SNF if he survives the hospitalization, but in-hospital death is a distinct possibility given the severity of his illness at the time of presentation. Consults called: None  Admission status: Admit - It is my clinical opinion that admission to INPATIENT is reasonable and necessary because of the expectation that this patient will require hospital care that crosses at least 2 midnights to treat this condition based on the medical complexity of the problems presented.  Given the aforementioned information, the predictability of an adverse outcome is felt to be significant.    Karmen Bongo MD [facility]   How to contact the [facility] Attending or Consulting provider [place] or covering provider during after hours [place], for this patient?  1. Check the care team in [facility] and look for a) attending/consulting TR[facility] provider listed and b) the [facility] team listed 2. Log into [email or web address] and use [place]'s universal password to access. If you do not have the password, please contact the hospital operator. 3. Locate the [facility] provider you are looking for under [facility] and page to a number that you can be directly reached. 4. If you still have difficulty reaching the provider, please page the [facility] (Director on Call) for the Hospitalists listed on amion for assistance.   12/12/2019, 9:00 AM

## 2019-12-12 NOTE — Progress Notes (Signed)
Addendum:  Patient tested positive for COVID-19 today.  I called and spoke with staff at [facility].  He previously tested positive for COVID-19 infection on 12/8.  Recently he had diarrhea with vomiting, no fever x 2 days.  He had no prior symptoms associated with COVID, it was detected with asymptomatic screening.  Patient d/w Infection Prevention.  Will place on precautions and admit to a COVID-19 bed for now and check COVID labs.  Lactate has improved to 3.1 (still high but clearly better) and procalcitonin is extremely high at 31.25.  Delta HS troponin is <20.  Patient d/w Dr. Megan Salon.  He could have active COVID (could be a false positive prior) in addition to sepsis.  He needs stress dosed steroids regardless so will add hydrocortisone.  Will also add Remdesivir for now.  If there is a question about whether he should be continued on precautions ongoing, Dr. Baxter Flattery will be available tomorrow.   Carlyon Shadow, M.D.

## 2019-12-12 NOTE — Plan of Care (Addendum)
X[employer] 86 yo male ward of the state, w dementia, h/o stroke, hypertension, Dm2, h/o prostate cancer apparently presents from [facility] for AMS x2 days.    T 97.9, P 105, R 20, Bp 102/63  Pox 98% on RA, wt 49.9kg    CT brain  IMPRESSION: 1. No acute intracranial abnormalities. 2. Chronic small vessel ischemic disease and brain atrophy.  CT abd/ pelvis IMPRESSION: Large hiatal hernia containing the majority the stomach. Patchy density in the right lower lobe that could represent mild right lower lobe pneumonia.  Fecal impaction with wall thickening of the rectum and surrounding stranding suggesting stercoral proctitis. No evidence of diverticulitis.  Bladder catheter in place.  Thick-walled bladder.  Aortic atherosclerosis.  Wbc 16.5, Hgb 16.9, plt 279 Na 138, K 5.1, Bun 44, Creatinine 2.13 Alb 1.8 Ast 34, Alt 14, Alk phos 34, t. Bili 0.8  Magnesium 1.6 (low) Urinalysis rbc 6-10, wbc 0-5  Lactic acid 5.3->  8.6  Blood culture x2 pending  Pt receive LR 1L iv x3 and magnesium sulfate 2gm iv x1, and zosyn iv x1 in ED  ED requesting admission for AMS, and sepsis of unclear source, ? Pneumonia. (HCAP) and acute renal failure    Problems: Sepsis (tachycardia, leukocytosis, elevated lactic acid) Blood culture x2 vanco iv, zosyn iv pharmacy to dose  Hcap Blood culture x2 Urine strep antigen Urine legionella antigen abx as above  Tachycardia Trop I Check d dimer, if positive consider VQ scan, please follow up Check cardiac echo   ARF Check urine sodium, urine prot/ creatinine, urine eosinophils Hydrate with ns iv Check cmp in am  AMS secondary to ? Sepsis Check b12, esr, tsh, rpr, ana, ammonia Monitor  Hypomagnesemia Replete, w magnesium sulfate 69m iv x1  Microscopic hematuria Consider further w/up as outpatient

## 2019-12-12 NOTE — ED Triage Notes (Signed)
Pt arrives from [place] for evaulation of AMS x2 days. Per EMS pt is normally alert, ambulatory. Today pt is lethargic. PMHX of Dementia. GCS 8, VS: BP 146/110. HR 106, SPO2 97%, CBG 146.

## 2019-12-12 NOTE — Progress Notes (Signed)
Pharmacy Antibiotic Note  [name] Rice is a 86 y.o. male admitted on 12/12/2019 with AMS/sepsis.  Pharmacy has been consulted for Vancomycin and Zosyn  dosing.  Plan: Vancomycin 1 g IV now, then 500 mg IV q48h Est AUC 400  Zosyn 3.375 g IV q8h   Height: 5' (152.4 cm) Weight: 110 lb (49.9 kg) IBW/kg (Calculated) : 50  Temp (24hrs), Avg:97.9 F (36.6 C), Min:97.9 F (36.6 C), Max:97.9 F (36.6 C)  Recent Labs  Lab 12/12/19 0130 12/12/19 0350 12/12/19 0355  WBC 16.5*  --   --   CREATININE  --  2.13*  --   LATICACIDVEN 5.3*  --  8.6*    Estimated Creatinine Clearance: 15.6 mL/min (A) (by C-G formula based on SCr of 2.13 mg/dL (H)).    No Known Allergies   Caryl Pina 12/12/2019 6:50 AM

## 2019-12-12 NOTE — ED Provider Notes (Signed)
[place] [facility] EMERGENCY DEPARTMENT Provider Note   CSN: [id number] Arrival date & time: 12/12/19  0126   History Chief Complaint  Patient presents with  . Altered Mental Status    [name] Rice is a 86 y.o. male.  The history is provided by the nursing home. The history is limited by the condition of the patient (Altered mental status).  Altered Mental Status He has history of hypertension, diabetes, dementia and was transferred from the skilled nursing facility where he is at because of altered mentation.  Reportedly, he is usually ambulatory and conversant, but has been neither for the last 2 days.  No further history is available, patient is completely nonverbal today.  Past Medical History:  Diagnosis Date  . Arthritis   . Cancer [facility])    prostate   . Dementia ([place])   . Diabetes mellitus without complication ([place])   . Difficulty urinating   . Essential hypertension, benign 11/17/2015  . Hearing loss   . Nasal congestion   . Stroke ([place])   . 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 Weakness     Patient Active Problem List   Diagnosis Date Noted  . Anxiety associated with depression 06/03/2016  . Allergic rhinitis 01/22/2016  . Essential hypertension, benign 11/17/2015  . Right inguinal hernia 07/10/2011  . Alzheimer's disease ([place]) 01/26/2009  . ADENOCARCINOMA, PROSTATE, GLEASON GRADE 7 10/29/2003    Past Surgical History:  Procedure Laterality Date  . HERNIA REPAIR  2012       Family History  Problem Relation Age of Onset  . Heart attack Father   . Diabetes Brother   . Hypertension Brother     Social History   Tobacco Use  . Smoking status: Current Every Day Smoker    Packs/day: 0.25    Years: 20.00    Pack years: 5.00  . Smokeless tobacco: Never Used  Substance Use Topics  . Alcohol use: Yes    Comment: daily  . Drug use: No    Home Medications Prior to Admission medications   Medication Sig Start Date End  Date Taking? Authorizing Provider  capsaicin (ZOSTRIX) 0.025 % cream Apply 1 application topically daily as needed.    [provider]  carbamide peroxide (DEBROX) 6.5 % otic solution Place 5 drops into both ears daily as needed (wax bld up).    [provider]  donepezil (ARICEPT) 10 MG tablet Take 1 tablet (10 mg total) by mouth at bedtime. 11/07/15   Orlie Dakin, MD  fluticasone (FLONASE) 50 MCG/ACT nasal spray Place 2 sprays into both nostrils daily.    [provider]  guaiFENesin (MUCINEX) 600 MG 12 hr tablet Take 600 mg by mouth 2 (two) times daily.    [provider]  loratadine (CLARITIN) 10 MG tablet Take 10 mg by mouth daily.    [provider]  memantine (NAMENDA XR) 14 MG CP24 24 hr capsule Take 14 mg by mouth daily.    [provider]  montelukast (SINGULAIR) 10 MG tablet Take 10 mg by mouth at bedtime.    [provider]  Multiple Vitamins-Minerals (MULTIVITAMIN) tablet Take 1 tablet by mouth daily. 11/07/15   Orlie Dakin, MD  Nutritional Supplements (NUTRITIONAL SUPPLEMENT PO) Take 90 mLs by mouth 2 (two) times daily. [facility] [employer]    [provider]  QUEtiapine (SEROQUEL) 25 MG tablet Take 12.5 mg by mouth at bedtime.     [provider]  sertraline (ZOLOFT) 25 MG tablet Take 25 mg by mouth daily.    [provider]    Allergies    Patient has no known allergies.  Review of Systems   Review of Systems  Unable to perform ROS: Mental status change    Physical Exam Updated Vital Signs BP 102/63   Pulse (!) 105   Temp 97.9 F (36.6 C) (Axillary)   Resp 20   Ht 5' (1.524 m)   Wt 49.9 kg   SpO2 98%   BMI 21.48 kg/m   Physical Exam Vitals and nursing note reviewed.   Cachectic 86 year old male, resting comfortably and in no acute distress. Vital signs are significant for mildly elevated heart rate. Oxygen saturation is 98%, which is normal. Head is normocephalic and atraumatic.   Right pupil is round and reactive, right pupil is oval and the iris appears to have detached from the medial aspect of the eye. Oropharynx is clear.  Mucous membranes are dry. Neck is nontender and supple without adenopathy or JVD. Back is nontender and there is no CVA tenderness. Lungs are clear without rales, wheezes, or rhonchi. Chest is nontender. Heart has regular rate and rhythm without murmur. Abdomen is soft, flat, with vague tenderness throughout the lower abdomen.  There is no rebound or guarding.  There are no masses or hepatosplenomegaly and peristalsis is hypoactive. Extremities have no cyanosis or edema, full range of motion is present. Skin is warm and dry without rash. Neurologic: Awake and responds to pain but nonverbal and does not follow commands, cranial nerves are intact, there are no gross motor or sensory deficits.  ED Results / Procedures / Treatments   Labs (all labs ordered are listed, but only abnormal results are displayed) Labs Reviewed  LACTIC ACID, PLASMA - Abnormal; Notable for the following components:      Result Value   Lactic Acid, Venous 5.3 (*)    All other components within normal limits  LACTIC ACID, PLASMA - Abnormal; Notable for the following components:   Lactic Acid, Venous 8.6 (*)    All other components within normal limits  CBC WITH DIFFERENTIAL/PLATELET - Abnormal; Notable for the following components:   WBC 16.5 (*)    Neutro Abs 14.3 (*)    All other components within normal limits  URINALYSIS, ROUTINE W REFLEX MICROSCOPIC - Abnormal; Notable for the following components:   Color, Urine AMBER (*)    APPearance HAZY (*)    Hgb urine dipstick SMALL (*)    Bilirubin Urine SMALL (*)    Ketones, ur 5 (*)    Protein, ur 30 (*)    All other components within normal limits  COMPREHENSIVE METABOLIC PANEL - Abnormal; Notable for the following components:   CO2 15 (*)    BUN 44 (*)    Creatinine, Ser 2.13 (*)    Calcium 8.0 (*)    Total  Protein 3.7 (*)    Albumin 1.8 (*)    Alkaline Phosphatase 34 (*)    GFR calc non Af Amer 26 (*)    GFR calc Af Amer 30 (*)    Anion gap 16 (*)    All other components within normal limits  MAGNESIUM - Abnormal; Notable for the following components:   Magnesium 1.6 (*)    All other components within normal limits  CULTURE, BLOOD (ROUTINE X 2)  CULTURE, BLOOD (ROUTINE X 2)  SARS CORONAVIRUS 2 (TAT 6-24 HRS)  LIPASE, BLOOD  SEDIMENTATION  RATE  TSH  RPR  AMMONIA  CK TOTAL AND CKMB (NOT AT [facility])  VITAMIN B12  SODIUM, URINE, RANDOM  PROTEIN / CREATININE RATIO, URINE  CYTOLOGY - NON PAP  TROPONIN I (HIGH SENSITIVITY)    EKG EKG Interpretation  Date/Time:  Sunday December 12 2019 02:11:14 EST Ventricular Rate:  110 PR Interval:    QRS Duration: 111 QT Interval:  340 QTC Calculation: 460 R Axis:   -120 Text Interpretation: Sinus tachycardia Consider right atrial enlargement Right superior axis Right ventricular hypertrophy When compared with ECG of 10/29/2015, No significant change was found Confirmed by Delora Fuel [id number]) on 12/12/2019 2:23:45 AM   Radiology CT ABDOMEN PELVIS WO CONTRAST  Result Date: 12/12/2019 CLINICAL DATA:  Lethargic over the last few days. Diverticulitis suspected clinically. EXAM: CT ABDOMEN AND PELVIS WITHOUT CONTRAST TECHNIQUE: Multidetector CT imaging of the abdomen and pelvis was performed following the standard protocol without IV contrast. COMPARISON:  11/02/2015 FINDINGS: Lower chest: Large hiatal hernia containing the majority of the stomach. Patchy density at the right lung base could represent mild right lower lobe pneumonia. Hepatobiliary: Chronic cyst of the left lobe. No acute liver parenchymal finding. Previously seen vascular anomaly of the lateral aspect of the right lobe not visible without contrast. Gallbladder is fairly distended but no calcified gallstones are seen. Pancreas: Atrophic changes.  No acute finding. Spleen: Normal  Adrenals/Urinary Tract: No adrenal lesion. Simple cyst upper pole left kidney. Simple cyst lower pole left kidney. Few small nonobstructing renal calculi. Catheter in the bladder. The bladder is thick-walled. Stomach/Bowel: Apparent rectal fecal impaction with possible proctitis. Surrounding edema. No sign of diverticulitis. Vascular/Lymphatic: Aortic atherosclerosis. No aneurysm. IVC is normal. No retroperitoneal adenopathy. Reproductive: Negative Other: No free fluid or air. Musculoskeletal: Chronic degenerative changes affect the lumbar spine. Old appearing compression deformities at T9 and T12. IMPRESSION: Large hiatal hernia containing the majority the stomach. Patchy density in the right lower lobe that could represent mild right lower lobe pneumonia. Fecal impaction with wall thickening of the rectum and surrounding stranding suggesting stercoral proctitis. No evidence of diverticulitis. Bladder catheter in place.  Thick-walled bladder. Aortic atherosclerosis. Electronically Signed   By: Nelson Chimes M.D.   On: 12/12/2019 05:56   CT Head Wo Contrast  Result Date: 12/12/2019 CLINICAL DATA:  Altered mental status. EXAM: CT HEAD WITHOUT CONTRAST TECHNIQUE: Contiguous axial images were obtained from the base of the skull through the vertex without intravenous contrast. COMPARISON:  02/26/2013 FINDINGS: Brain: No evidence of acute infarction, hemorrhage, hydrocephalus, extra-axial collection or mass lesion/mass effect. There is mild diffuse low-attenuation within the subcortical and periventricular white matter compatible with chronic microvascular disease. Prominent sulci and ventricles compatible with brain atrophy. Vascular: No hyperdense vessel or unexpected calcification. Skull: Normal. Negative for fracture or focal lesion. Sinuses/Orbits: No acute finding. Other: None IMPRESSION: 1. No acute intracranial abnormalities. 2. Chronic small vessel ischemic disease and brain atrophy. Electronically Signed    By: Kerby Moors M.D.   On: 12/12/2019 06:04   DG Chest Port 1 View  Result Date: 12/12/2019 CLINICAL DATA:  Altered mental status, lethargy, dementia EXAM: PORTABLE CHEST 1 VIEW COMPARISON:  10/29/2015 FINDINGS: Lungs are essentially clear. Mild platelike scarring/atelectasis at the right lung base. No pleural effusion or pneumothorax. The heart is normal in size. Moderate hiatal hernia. IMPRESSION: No evidence of acute cardiopulmonary disease. Electronically Signed   By: Julian Hy M.D.   On: 12/12/2019 02:20    Procedures Procedures  CRITICAL CARE Performed by: Shanon Brow  Wilfred Dayrit Total critical care time: 140 minutes Critical care time was exclusive of separately billable procedures and treating other patients. Critical care was necessary to treat or prevent imminent or life-threatening deterioration. Critical care was time spent personally by me on the following activities: development of treatment plan with patient and/or surrogate as well as nursing, discussions with consultants, evaluation of patient's response to treatment, examination of patient, obtaining history from patient or surrogate, ordering and performing treatments and interventions, ordering and review of laboratory studies, ordering and review of radiographic studies, pulse oximetry and re-evaluation of patient's condition.  Medications Ordered in ED Medications  lactated ringers bolus 1,000 mL (has no administration in time range)    ED Course  I have reviewed the triage vital signs and the nursing notes.  Pertinent labs & imaging results that were available during my care of the patient were reviewed by me and considered in my medical decision making (see chart for details).  MDM Rules/Calculators/A&P Altered mental status of uncertain cause.  Patient appears dehydrated, need to evaluate for possible occult infection.  We will also send for CT of head.  He appears to have abdominal tenderness.  Because of inability  to get adequate history and vague findings on physical exam, will send for CT of abdomen and pelvis.  Old records are reviewed, and he has three prior ED visits for abdominal pain, prior CT scans of abdomen and pelvis showed diverticulosis raising possibility of diverticulitis.  Chest x-ray shows no acute process.  Labs show evidence of acute kidney injury with creatinine 2.13 and BUN 44 compared with creatinine 0.9 and BUN 22 in March 2018.  Lactic acid level has come back at 5.3 and this was felt to be due to dehydration he was given additional IV fluids.  However, repeat lactic acid level has actually increased to 8.6.  Additional fluids are ordered.  CT of abdomen and pelvis shows probable stercoral proctitis and this may be source of sepsis there is also an area of possible pneumonia of the right lung base.  He started on Zosyn which should give adequate coverage for both sources of possible infection.  CT of the head shows no acute process.  I have discussed these findings with his daughter Mariann Laster who states that patient would not want to be on life support, but she does not have power of attorney for him.  Case is discussed with Dr. Maudie Mercury of [facility], who agrees to admit the patient.  Final Clinical Impression(s) / ED Diagnoses Final diagnoses:  Acute kidney injury (nontraumatic) (HCC)  Elevated lactic acid level  Metabolic acidosis    Rx / DC Orders ED Discharge Orders    None       Delora Fuel, MD [id number] [phone]

## 2019-12-13 DIAGNOSIS — N179 Acute kidney failure, unspecified: Secondary | ICD-10-CM

## 2019-12-13 DIAGNOSIS — G301 Alzheimer's disease with late onset: Secondary | ICD-10-CM

## 2019-12-13 DIAGNOSIS — F0280 Dementia in other diseases classified elsewhere without behavioral disturbance: Secondary | ICD-10-CM

## 2019-12-13 DIAGNOSIS — Z66 Do not resuscitate: Secondary | ICD-10-CM

## 2019-12-13 LAB — CBC
HCT: 41.4 % (ref 39.0–52.0)
Hemoglobin: 13.9 g/dL (ref 13.0–17.0)
MCH: 31.6 pg (ref 26.0–34.0)
MCHC: 33.6 g/dL (ref 30.0–36.0)
MCV: 94.1 fL (ref 80.0–100.0)
Platelets: 250 10*3/uL (ref 150–400)
RBC: 4.4 MIL/uL (ref 4.22–5.81)
RDW: 15.1 % (ref 11.5–15.5)
WBC: 11.4 10*3/uL — ABNORMAL HIGH (ref 4.0–10.5)
nRBC: 0 % (ref 0.0–0.2)

## 2019-12-13 LAB — BASIC METABOLIC PANEL
Anion gap: 18 — ABNORMAL HIGH (ref 5–15)
BUN: 55 mg/dL — ABNORMAL HIGH (ref 8–23)
CO2: 19 mmol/L — ABNORMAL LOW (ref 22–32)
Calcium: 8.4 mg/dL — ABNORMAL LOW (ref 8.9–10.3)
Chloride: 106 mmol/L (ref 98–111)
Creatinine, Ser: 1.98 mg/dL — ABNORMAL HIGH (ref 0.61–1.24)
GFR calc Af Amer: 33 mL/min — ABNORMAL LOW (ref >60)
GFR calc non Af Amer: 28 mL/min — ABNORMAL LOW (ref >60)
Glucose, Bld: 116 mg/dL — ABNORMAL HIGH (ref 70–99)
Potassium: 4.5 mmol/L (ref 3.5–5.1)
Sodium: 143 mmol/L (ref 135–145)

## 2019-12-13 LAB — URINE CULTURE: Culture: NO GROWTH

## 2019-12-13 LAB — C DIFFICILE QUICK SCREEN W PCR REFLEX
C Diff antigen: NEGATIVE
C Diff interpretation: NOT DETECTED
C Diff toxin: NEGATIVE

## 2019-12-13 LAB — CYTOLOGY - NON PAP

## 2019-12-13 MED ORDER — FLEET ENEMA 7-19 GM/118ML RE ENEM
1.0000 | ENEMA | Freq: Once | RECTAL | Status: AC
  Administered 2019-12-13: 1 via RECTAL
  Filled 2019-12-13: qty 1

## 2019-12-13 MED ORDER — PIPERACILLIN-TAZOBACTAM IN DEX 2-0.25 GM/50ML IV SOLN
2.2500 g | Freq: Three times a day (TID) | INTRAVENOUS | Status: DC
  Administered 2019-12-13 – 2019-12-14 (×3): 2.25 g via INTRAVENOUS
  Filled 2019-12-13 (×4): qty 50

## 2019-12-13 MED ORDER — BISACODYL 10 MG RE SUPP
10.0000 mg | Freq: Four times a day (QID) | RECTAL | Status: AC
  Administered 2019-12-13 (×2): 10 mg via RECTAL
  Filled 2019-12-13 (×2): qty 1

## 2019-12-13 NOTE — Progress Notes (Signed)
PROGRESS NOTE                                                                                                                                                                                                             Patient Demographics:    [name] Rice, is a 86 y.o. male, DOB - August 12, 1925, QB:[id number]  Admit date - 12/12/2019   Admitting Physician Karmen Bongo, MD  Outpatient Primary MD for the patient is Caprice Renshaw, MD  LOS - 1   Chief Complaint  Patient presents with  . Altered Mental Status       Brief Narrative    86 y.o. male with medical history significant of dementia; remote ETOH dependence; CVA; HTN; DM; and prostate cancer presenting with AMS.  Per EMS, he is usually alert and ambulatory.  He is obtunded and unable to answer any questions at this time.  His work-up significant for COVID-19 pneumonia, and still coral proctitis septic with elevated lactic acid, AKI.  Patient has a signed and scanned advanced directive listing [name] as POA.  It says "no life support".  Additionally, he specifically requested no life-prolonged measures if he has advanced dementia that is not likely to improve.  This was signed on 02/07/19.  Patient is DNR.    Subjective:    [name] Rice today he is verbal, unable to provide any complaints, no significant events as discussed with staff .   Assessment  & Plan :    Principal Problem:   Sepsis due to undetermined organism [facility]) Active Problems:   Alzheimer's disease ([place])   Essential hypertension, benign   DNR (do not resuscitate)   Sepsis present on admission  -Sepsis present on admission including leukocytosis, tachycardia, tachypnea  and evidence of acute organ failure with elevated lactate >2; encephalopathy; creatinine >2 that is not easily explained by another condition. -Sepsis most likely in the setting of stercoral colitis and right lower lobe pneumonia. -Continue with  broad-spectrum antibiotic coverage including vancomycin and Zosyn. -Significant elevated procalcitonin at 31. -Lactic acid trending down. -Follow on blood cultures -Continue with stress dose steroids.  COVID-19 pneumonia -Patient with evidence of opacity on imaging, will do inflammatory markers including CRP and D-dimers, started on IV remdesivir, as well he is hypoxic on 2 L nasal cannula, he is on IV steroids. -Tinea to trend inflammatory  markers, significantly elevated, especially with D-dimers more than 5, continue with DVT prophylaxis per COVID-19 protocol .  Acute renal failure -Prerenal secondary to volume depletion, continue with IV fluids.  HTN -He no longer appears to be taking medications for this issue  Dementia/acute metabolic encephalopathy -Patient appears with underlying dementia with multiple medication occluding Namenda, Seroquel and trazodone. -Currently with worsening mental status secondary to infectious process.    DNR -The patient has advanced directives in his chart, which is extremely helpful -He is a ward of the state and DSS was notified of the admission and the plan to abide by his previously stated wishes -Based on his ACP, he is DNR -Additionally, if he does not respond in a timely manner to the care plan, he has noted that he would not desire ongoing life-prolonging measures and should transition to comfort care -Admitting physician was able to communicate directly with his legal guardian (despite it being after hours! Her number is listed at the top of my note and she said that she will be happy to take calls at any time) and confirmed r plan of care as well as to defy legal guardian  of the severity of his condition and his overall poor prognosis at this time.    COVID-19 Labs  Recent Labs    12/12/19 1213 12/12/19 1216  DDIMER  --  6.12*  FERRITIN 85  --   LDH 231*  --   CRP 31.7*  --     Lab Results  Component Value Date    SARSCOV2NAA POSITIVE (A) 12/12/2019     Code Status : DNR  Family Communication  : Follow progression during daytime and update legal guardian/family later in the day  Disposition Plan  : Pending clinical progression  Barriers For Discharge : Still septic, on IV steroids and antibiotics  Consults  :  None  Procedures  : None  DVT Prophylaxis  :  [place] heparin  Lab Results  Component Value Date   PLT 250 12/13/2019    Antibiotics  :    Anti-infectives (From admission, onward)   Start     Dose/Rate Route Frequency Ordered Stop   12/14/19 0800  vancomycin (VANCOREADY) IVPB 500 mg/100 mL     500 mg 100 mL/hr over 60 Minutes Intravenous Every 48 hours 12/12/19 0655     12/13/19 1400  piperacillin-tazobactam (ZOSYN) IVPB 2.25 g     2.25 g 100 mL/hr over 30 Minutes Intravenous Every 8 hours 12/13/19 0838     12/13/19 1000  remdesivir 100 mg in sodium chloride 0.9 % 100 mL IVPB     100 mg 200 mL/hr over 30 Minutes Intravenous Daily 12/12/19 1110 12/17/19 0959   12/12/19 1400  piperacillin-tazobactam (ZOSYN) IVPB 3.375 g  Status:  Discontinued     3.375 g 12.5 mL/hr over 240 Minutes Intravenous Every 8 hours 12/12/19 0655 12/13/19 0838   12/12/19 1115  remdesivir 200 mg in sodium chloride 0.9% 250 mL IVPB     200 mg 580 mL/hr over 30 Minutes Intravenous Once 12/12/19 1110 12/12/19 1457   12/12/19 0730  vancomycin (VANCOCIN) IVPB 1000 mg/200 mL premix     1,000 mg 200 mL/hr over 60 Minutes Intravenous  Once 12/12/19 0655 12/12/19 0832   12/12/19 0615  piperacillin-tazobactam (ZOSYN) IVPB 3.375 g     3.375 g 100 mL/hr over 30 Minutes Intravenous  Once 12/12/19 0609 12/12/19 0717        Objective:   Vitals:   12/13/19  0200 12/13/19 0431 12/13/19 0638 12/13/19 0800  BP:  (!) 123/102  (!) 150/67  Pulse: 100 (!) 108 (!) 113 (!) 108  Resp:  19 19 19   Temp:  98.3 F (36.8 C)    TempSrc:  Oral    SpO2: 90% 91% 90% 90%  Weight:      Height:        Wt Readings from Last 3  Encounters:  12/12/19 49.9 kg  04/10/17 54 kg  03/06/17 54 kg     Intake/Output Summary (Last 24 hours) at 12/13/2019 1041 Last data filed at 12/13/2019 0524 Gross per 24 hour  Intake 859.61 ml  Output 401 ml  Net 458.61 ml     Physical Exam  Patient eyes are open, but he is not interactive, does not follow commands or answer any questions, extremely frail, cachectic . Symmetrical Chest wall movement, Good air movement bilaterally, no wheezing RRR,No Gallops,Rubs or new Murmurs, No Parasternal Heave +ve B.Sounds, Abd Soft, No tenderness,  No Cyanosis, Clubbing or edema, No new Rash or bruise      Data Review:    CBC Recent Labs  Lab 12/12/19 0130 12/13/19 0536  WBC 16.5* 11.4*  HGB 16.9 13.9  HCT 50.4 41.4  PLT 279 250  MCV 95.6 94.1  MCH 32.1 31.6  MCHC 33.5 33.6  RDW 14.8 15.1  LYMPHSABS 1.2  --   MONOABS 1.0  --   EOSABS 0.0  --   BASOSABS 0.0  --     Chemistries  Recent Labs  Lab 12/12/19 0350 12/13/19 0536  NA 138 143  K 5.1 4.5  CL 107 106  CO2 15* 19*  GLUCOSE 92 116*  BUN 44* 55*  CREATININE 2.13* 1.98*  CALCIUM 8.0* 8.4*  MG 1.6*  --   AST 34  --   ALT 14  --   ALKPHOS 34*  --   BILITOT 0.8  --    ------------------------------------------------------------------------------------------------------------------ No results for input(s): CHOL, HDL, LDLCALC, TRIG, CHOLHDL, LDLDIRECT in the last 72 hours.  Lab Results  Component Value Date   HGBA1C 5.9 (H) 07/26/2015   ------------------------------------------------------------------------------------------------------------------ Recent Labs    12/12/19 0650  TSH 2.057   ------------------------------------------------------------------------------------------------------------------ Recent Labs    12/12/19 0650 12/12/19 1213  VITAMINB12 461  --   FERRITIN  --  85    Coagulation profile No results for input(s): INR, PROTIME in the last 168 hours.  Recent Labs     12/12/19 1216  DDIMER 6.12*    Cardiac Enzymes Recent Labs  Lab 12/12/19 0650  CKMB 9.3*   ------------------------------------------------------------------------------------------------------------------    Component Value Date/Time   BNP 65.6 07/25/2015 1640    Inpatient Medications  Scheduled Meds: . bisacodyl  10 mg Rectal Q6H  . Chlorhexidine Gluconate Cloth  6 each Topical Daily  . docusate sodium  100 mg Oral BID  . fluticasone  2 spray Each Nare QHS  . heparin injection (subcutaneous)  7,500 Units Subcutaneous Q8H  . hydrocortisone sod succinate (SOLU-CORTEF) inj  50 mg Intravenous Q6H  . memantine  14 mg Oral QHS  . montelukast  10 mg Oral QHS  . QUEtiapine  12.5 mg Oral QHS  . sodium chloride flush  3 mL Intravenous Q12H  . sodium phosphate  1 enema Rectal Once  . traZODone  50 mg Oral QHS   Continuous Infusions: . lactated ringers 100 mL/hr at 12/13/19 0416  . piperacillin-tazobactam (ZOSYN)  IV    . remdesivir 100 mg in NS  100 mL    . [START ON 12/14/2019] vancomycin     PRN Meds:.acetaminophen **OR** acetaminophen, ondansetron **OR** ondansetron (ZOFRAN) IV, polyethylene glycol  Micro Results Recent Results (from the past 240 hour(s))  Culture, blood (routine x 2)     Status: None (Preliminary result)   Collection Time: 12/12/19  1:40 AM   Specimen: BLOOD LEFT ARM  Result Value Ref Range Status   Specimen Description BLOOD LEFT ARM  Final   Special Requests   Final    BOTTLES DRAWN AEROBIC AND ANAEROBIC Blood Culture adequate volume   Culture   Final    NO GROWTH 1 DAY Performed at [facility], [place] [address], [place], [place] [address]    Report Status PENDING  Incomplete  Culture, blood (routine x 2)     Status: None (Preliminary result)   Collection Time: 12/12/19  1:40 AM   Specimen: BLOOD RIGHT ARM  Result Value Ref Range Status   Specimen Description BLOOD RIGHT ARM  Final   Special Requests   Final    BOTTLES DRAWN AEROBIC AND  ANAEROBIC Blood Culture adequate volume   Culture   Final    NO GROWTH 1 DAY Performed at [facility], [place] [address] [place], [place] [address]    Report Status PENDING  Incomplete  SARS CORONAVIRUS 2 (TAT 6-24 HRS) Nasopharyngeal Nasopharyngeal Swab     Status: Abnormal   Collection Time: 12/12/19  3:13 AM   Specimen: Nasopharyngeal Swab  Result Value Ref Range Status   SARS Coronavirus 2 POSITIVE (A) NEGATIVE Final    Comment: RESULT CALLED TO, READ BACK BY AND VERIFIED WITH: CRYSTAL BAIN RN.@1014  ON 2.14.21 BY TCALDWELL MT. (NOTE) SARS-CoV-2 target nucleic acids are DETECTED. The SARS-CoV-2 RNA is generally detectable in upper and lower respiratory specimens during the acute phase of infection. Positive results are indicative of the presence of SARS-CoV-2 RNA. Clinical correlation with patient history and other diagnostic information is  necessary to determine patient infection status. Positive results do not rule out bacterial infection or co-infection with other viruses.  The expected result is Negative. Fact Sheet for Patients: SugarRoll.be Fact Sheet for Healthcare Providers: https://www.woods-mathews.com/ This test is not yet approved or cleared by the [place] FDA and  has been authorized for detection and/or diagnosis of SARS-CoV-2 by FDA under an Emergency Use Authorization (EUA). This EUA will remain  in effect (meaning this test can be  used) for the duration of the COVID-19 declaration under Section 564(b)(1) of the Act, 21 U.S.C. section 360bbb-3(b)(1), unless the authorization is terminated or revoked sooner. Performed at [facility], [place] [address] [place], [place] [address]   Culture, Urine     Status: None   Collection Time: 12/12/19  3:03 PM   Specimen: Urine, Random  Result Value Ref Range Status   Specimen Description URINE, RANDOM  Final   Special Requests NONE  Final   Culture   Final     NO GROWTH Performed at [facility], [place] [address] [place], [place] [address]    Report Status 12/13/2019 FINAL  Final  C difficile quick scan w PCR reflex     Status: None   Collection Time: 12/12/19  9:50 PM   Specimen: STOOL  Result Value Ref Range Status   C Diff antigen NEGATIVE NEGATIVE Final   C Diff toxin NEGATIVE NEGATIVE Final   C Diff interpretation No C. difficile detected.  Final    Comment:  Performed at [facility], [place] [address] [place], [place] [address]    Radiology Reports CT ABDOMEN PELVIS WO CONTRAST  Result Date: 12/12/2019 CLINICAL DATA:  Lethargic over the last few days. Diverticulitis suspected clinically. EXAM: CT ABDOMEN AND PELVIS WITHOUT CONTRAST TECHNIQUE: Multidetector CT imaging of the abdomen and pelvis was performed following the standard protocol without IV contrast. COMPARISON:  11/02/2015 FINDINGS: Lower chest: Large hiatal hernia containing the majority of the stomach. Patchy density at the right lung base could represent mild right lower lobe pneumonia. Hepatobiliary: Chronic cyst of the left lobe. No acute liver parenchymal finding. Previously seen vascular anomaly of the lateral aspect of the right lobe not visible without contrast. Gallbladder is fairly distended but no calcified gallstones are seen. Pancreas: Atrophic changes.  No acute finding. Spleen: Normal Adrenals/Urinary Tract: No adrenal lesion. Simple cyst upper pole left kidney. Simple cyst lower pole left kidney. Few small nonobstructing renal calculi. Catheter in the bladder. The bladder is thick-walled. Stomach/Bowel: Apparent rectal fecal impaction with possible proctitis. Surrounding edema. No sign of diverticulitis. Vascular/Lymphatic: Aortic atherosclerosis. No aneurysm. IVC is normal. No retroperitoneal adenopathy. Reproductive: Negative Other: No free fluid or air. Musculoskeletal: Chronic degenerative changes affect the lumbar spine. Old appearing compression deformities  at T9 and T12. IMPRESSION: Large hiatal hernia containing the majority the stomach. Patchy density in the right lower lobe that could represent mild right lower lobe pneumonia. Fecal impaction with wall thickening of the rectum and surrounding stranding suggesting stercoral proctitis. No evidence of diverticulitis. Bladder catheter in place.  Thick-walled bladder. Aortic atherosclerosis. Electronically Signed   By: Nelson Chimes M.D.   On: 12/12/2019 05:56   CT Head Wo Contrast  Result Date: 12/12/2019 CLINICAL DATA:  Altered mental status. EXAM: CT HEAD WITHOUT CONTRAST TECHNIQUE: Contiguous axial images were obtained from the base of the skull through the vertex without intravenous contrast. COMPARISON:  02/26/2013 FINDINGS: Brain: No evidence of acute infarction, hemorrhage, hydrocephalus, extra-axial collection or mass lesion/mass effect. There is mild diffuse low-attenuation within the subcortical and periventricular white matter compatible with chronic microvascular disease. Prominent sulci and ventricles compatible with brain atrophy. Vascular: No hyperdense vessel or unexpected calcification. Skull: Normal. Negative for fracture or focal lesion. Sinuses/Orbits: No acute finding. Other: None IMPRESSION: 1. No acute intracranial abnormalities. 2. Chronic small vessel ischemic disease and brain atrophy. Electronically Signed   By: Kerby Moors M.D.   On: 12/12/2019 06:04   DG Chest Port 1 View  Result Date: 12/12/2019 CLINICAL DATA:  Altered mental status, lethargy, dementia EXAM: PORTABLE CHEST 1 VIEW COMPARISON:  10/29/2015 FINDINGS: Lungs are essentially clear. Mild platelike scarring/atelectasis at the right lung base. No pleural effusion or pneumothorax. The heart is normal in size. Moderate hiatal hernia. IMPRESSION: No evidence of acute cardiopulmonary disease. Electronically Signed   By: Julian Hy M.D.   On: 12/12/2019 02:20     Phillips Climes M.D on 12/13/2019 at 10:41  AM  Between 7am to 7pm - Pager - [phone]  After 7pm go to [email or web address] - password [facility]  [facility] -  Office  [phone]

## 2019-12-13 NOTE — Plan of Care (Signed)
  Problem: Education: Goal: Knowledge of risk factors and measures for prevention of condition will improve Outcome: Not Progressing   Problem: Coping: Goal: Psychosocial and spiritual needs will be supported Outcome: Not Progressing   Problem: Respiratory: Goal: Will maintain a patent airway Outcome: Progressing Goal: Complications related to the disease process, condition or treatment will be avoided or minimized Outcome: Progressing   Problem: Education: Goal: Knowledge of General Education information will improve Description: Including pain rating scale, medication(s)/side effects and non-pharmacologic comfort measures Outcome: Not Progressing   Problem: Health Behavior/Discharge Planning: Goal: Ability to manage health-related needs will improve Outcome: Not Progressing   Problem: Clinical Measurements: Goal: Ability to maintain clinical measurements within normal limits will improve Outcome: Progressing Goal: Will remain free from infection Outcome: Not Progressing Goal: Diagnostic test results will improve Outcome: Progressing Goal: Respiratory complications will improve Outcome: Progressing Goal: Cardiovascular complication will be avoided Outcome: Progressing   Problem: Activity: Goal: Risk for activity intolerance will decrease Outcome: Not Progressing   Problem: Nutrition: Goal: Adequate nutrition will be maintained Outcome: Not Progressing   Problem: Coping: Goal: Level of anxiety will decrease Outcome: Not Applicable   Problem: Elimination: Goal: Will not experience complications related to bowel motility Outcome: Not Progressing Goal: Will not experience complications related to urinary retention Outcome: Not Applicable   Problem: Pain Managment: Goal: General experience of comfort will improve Outcome: Progressing   Problem: Safety: Goal: Ability to remain free from injury will improve Outcome: Progressing   Problem: Skin Integrity: Goal:  Risk for impaired skin integrity will decrease Outcome: Progressing

## 2019-12-13 NOTE — Progress Notes (Signed)
Pharmacy Antibiotic Note  [name] Rice is a 86 y.o. male admitted on 12/12/2019 with AMS/sepsis.  Pharmacy has been consulted for Vancomycin and Zosyn  dosing.  CrCl ~16 ml/min (scr 1.98)  Plan: Zosyn 2.25 g IV q8h   Height: 5' (152.4 cm) Weight: 110 lb (49.9 kg) IBW/kg (Calculated) : 50  Temp (24hrs), Avg:98.2 F (36.8 C), Min:97.5 F (36.4 C), Max:98.6 F (37 C)  Recent Labs  Lab 12/12/19 0130 12/12/19 0350 12/12/19 0355 12/12/19 0909 12/12/19 1212 12/13/19 0536  WBC 16.5*  --   --   --   --  11.4*  CREATININE  --  2.13*  --   --   --  1.98*  LATICACIDVEN 5.3*  --  8.6* 3.1* 2.5*  --     Estimated Creatinine Clearance: 16.8 mL/min (A) (by C-G formula based on SCr of 1.98 mg/dL (H)).    No Known Allergies   [name] Rice A. Levada Dy, PharmD, BCPS, FNKF Clinical Pharmacist [place] Please utilize Amion for appropriate phone number to reach the unit pharmacist ([place])   12/13/2019 8:36 AM

## 2019-12-13 NOTE — Progress Notes (Signed)
Patient is resting quietly, in NAD.  Patient sats are maintained on RA.  Patient is nonverbal still and still nonresponsive.  Will continue to monitor.

## 2019-12-13 NOTE — Progress Notes (Signed)
Patient sats dropped to 88% on RA.  Patient placed on 2L/[place].  Sats increased to 94%.

## 2019-12-13 NOTE — Progress Notes (Signed)
[facility] contacted CSW and expressed concern that hospital notes state that communication has gone through patient's friend, [name] and patient's daughter instead of DSS Guardian. CSW confirmed with DSS Guardian, Elmyra Ricks, that she is legal guardian and POA paperwork from [name] is no longer effective. Elmyra Ricks did confirm that patient is a DNR.   Percell Locus [place] LCSW [phone]

## 2019-12-14 DIAGNOSIS — Z515 Encounter for palliative care: Secondary | ICD-10-CM

## 2019-12-14 LAB — COMPREHENSIVE METABOLIC PANEL
ALT: 31 U/L (ref 0–44)
AST: 62 U/L — ABNORMAL HIGH (ref 15–41)
Albumin: 2.4 g/dL — ABNORMAL LOW (ref 3.5–5.0)
Alkaline Phosphatase: 49 U/L (ref 38–126)
Anion gap: 11 (ref 5–15)
BUN: 43 mg/dL — ABNORMAL HIGH (ref 8–23)
CO2: 20 mmol/L — ABNORMAL LOW (ref 22–32)
Calcium: 7.8 mg/dL — ABNORMAL LOW (ref 8.9–10.3)
Chloride: 113 mmol/L — ABNORMAL HIGH (ref 98–111)
Creatinine, Ser: 1.4 mg/dL — ABNORMAL HIGH (ref 0.61–1.24)
GFR calc Af Amer: 50 mL/min — ABNORMAL LOW (ref >60)
GFR calc non Af Amer: 43 mL/min — ABNORMAL LOW (ref >60)
Glucose, Bld: 121 mg/dL — ABNORMAL HIGH (ref 70–99)
Potassium: 3.4 mmol/L — ABNORMAL LOW (ref 3.5–5.1)
Sodium: 144 mmol/L (ref 135–145)
Total Bilirubin: 1 mg/dL (ref 0.3–1.2)
Total Protein: 5.3 g/dL — ABNORMAL LOW (ref 6.5–8.1)

## 2019-12-14 LAB — CBC
HCT: 38.1 % — ABNORMAL LOW (ref 39.0–52.0)
Hemoglobin: 13 g/dL (ref 13.0–17.0)
MCH: 32 pg (ref 26.0–34.0)
MCHC: 34.1 g/dL (ref 30.0–36.0)
MCV: 93.8 fL (ref 80.0–100.0)
Platelets: 215 10*3/uL (ref 150–400)
RBC: 4.06 MIL/uL — ABNORMAL LOW (ref 4.22–5.81)
RDW: 15.2 % (ref 11.5–15.5)
WBC: 11.6 10*3/uL — ABNORMAL HIGH (ref 4.0–10.5)
nRBC: 0 % (ref 0.0–0.2)

## 2019-12-14 LAB — C-REACTIVE PROTEIN: CRP: 27.3 mg/dL — ABNORMAL HIGH (ref <1.0)

## 2019-12-14 LAB — D-DIMER, QUANTITATIVE: D-Dimer, Quant: 4.43 ug/mL-FEU — ABNORMAL HIGH (ref 0.00–0.50)

## 2019-12-14 LAB — PROCALCITONIN: Procalcitonin: 7.64 ng/mL

## 2019-12-14 MED ORDER — HYDROCORTISONE NA SUCCINATE PF 100 MG IJ SOLR
50.0000 mg | Freq: Every day | INTRAMUSCULAR | Status: DC
  Administered 2019-12-15: 50 mg via INTRAVENOUS
  Filled 2019-12-14: qty 2

## 2019-12-14 MED ORDER — PIPERACILLIN-TAZOBACTAM 3.375 G IVPB
3.3750 g | Freq: Three times a day (TID) | INTRAVENOUS | Status: DC
  Administered 2019-12-14 – 2019-12-15 (×3): 3.375 g via INTRAVENOUS
  Filled 2019-12-14 (×3): qty 50

## 2019-12-14 MED ORDER — VANCOMYCIN HCL 750 MG/150ML IV SOLN: 750.0000 mg | INTRAVENOUS | Status: DC

## 2019-12-14 MED ORDER — BISACODYL 10 MG RE SUPP: 10.0000 mg | Freq: Two times a day (BID) | RECTAL | Status: AC

## 2019-12-14 MED ORDER — HEPARIN SODIUM (PORCINE) 5000 UNIT/ML IJ SOLN
5000.0000 [IU] | Freq: Three times a day (TID) | INTRAMUSCULAR | Status: DC
  Administered 2019-12-14 – 2019-12-15 (×3): 5000 [IU] via SUBCUTANEOUS
  Filled 2019-12-14 (×3): qty 1

## 2019-12-14 NOTE — Progress Notes (Signed)
Pharmacy notified of prolonged QTc interval when zosyn is infusing., Pharmacy confirmed to run zosyn over 1 hour and requested an 12 lead EKG to confirm findings if his Qt prolongs with this rate

## 2019-12-14 NOTE — Progress Notes (Signed)
Pharmacy Antibiotic Note  [name] Rice is a 86 y.o. male admitted on 12/12/2019 with AMS/sepsis.  Pharmacy has been consulted for Vancomycin and Zosyn  dosing.  CrCl ~23 ml/min (scr 1.98>1.4) Blood cxs NGTD  Plan: Increase Zosyn 3.375gm g IV q8h Increase Vancomycin to 750mg  IV q48h with next dose due on 2/17 at 2000.  Expected AUC 528, Scr used 1.4 F/u deescalation and LOT  Height: 5' (152.4 cm) Weight: 110 lb 7.2 oz (50.1 kg) IBW/kg (Calculated) : 50  Temp (24hrs), Avg:98.7 F (37.1 C), Min:98.3 F (36.8 C), Max:99 F (37.2 C)  Recent Labs  Lab 12/12/19 0130 12/12/19 0350 12/12/19 0355 12/12/19 0909 12/12/19 1212 12/13/19 0536 12/14/19 0313  WBC 16.5*  --   --   --   --  11.4* 11.6*  CREATININE  --  2.13*  --   --   --  1.98* 1.40*  LATICACIDVEN 5.3*  --  8.6* 3.1* 2.5*  --   --     Estimated Creatinine Clearance: 23.8 mL/min (A) (by C-G formula based on SCr of 1.4 mg/dL (H)).    No Known Allergies   [name] Rice A. Levada Dy, PharmD, BCPS, FNKF Clinical Pharmacist [place] Please utilize Amion for appropriate phone number to reach the unit pharmacist ([place])   12/14/2019 9:08 AM

## 2019-12-14 NOTE — Consult Note (Signed)
Consultation Note Date: 12/14/2019   Patient Name: [name] Rice  DOB: 1925-09-22  MRN: [id number]  Age / Sex: 86 y.o., male  PCP: Caprice Renshaw, MD Referring Physician: Albertine Patricia, MD  Reason for Consultation: Establishing goals of care  HPI/Patient Profile: 86 y.o. male  with past medical history of dementia, remote ETOH dependence, CVA, HTN, DM and prostate cancer admitted on 12/12/2019 with AMS/nonverbal.  Diagnosed with sepsis most likely in setting of stercoral proctitis and COVID-19 RLL pna.  Patient is obtunded with no PO intake. PMT consulted for [place].  Clinical Assessment and Goals of Care: I have reviewed medical records including EPIC notes, labs and imaging, and received report from Dr. Waldron Labs and RN.  Patient is not interactive - nonverbal, unable to consume any PO intake. Per Dr. Waldron Labs - patient has had appropriate medical treatment with fluids and antibiotics yet remains encephalopathic with no PO intake - seems appropriate to proceed with comfort care if guardian in agreement.   I attempted to call patient's legal guardian x2 - Terrace [place] [phone] - left voicemail with call back number. Also, attempted to call supervisor Adria Devon at [phone] - no answer, left voicemail with call back number.   Primary Decision Maker LEGAL GUARDIAN Terrace [place] [phone]    SUMMARY OF RECOMMENDATIONS   - agree that comfort care is appropriate if legal guardian agrees - will plan to discuss return to SNF with hospice - will await call back / plan to reach out to legal guardian again 2/17  Code Status/Advance Care Planning:  DNR  Prognosis:   < 2 weeks  Discharge Planning: To Be Determined      Primary Diagnoses: Present on Admission: . Sepsis due to undetermined organism ([place]) . Alzheimer's disease ([place]) . Essential hypertension, benign . DNR (do not resuscitate)   I have reviewed the medical  record, interviewed the patient and family, and examined the patient. The following aspects are pertinent.  Past Medical History:  Diagnosis Date  . Arthritis   . Cancer [facility])    prostate   . Dementia ([place])   . Diabetes mellitus without complication ([place])   . Difficulty urinating   . Essential hypertension, benign 11/17/2015  . Hearing loss   . Nasal congestion   . Stroke ([place])   . Substance abuse ([place])    alcohol -pt is 60 days sober  . Weakness    Social History   Socioeconomic History  . Marital status: Widowed    Spouse name: Not on file  . Number of children: Not on file  . Years of education: Not on file  . Highest education level: Not on file  Occupational History  . Not on file  Tobacco Use  . Smoking status: Current Every Day Smoker    Packs/day: 0.25    Years: 20.00    Pack years: 5.00  . Smokeless tobacco: Never Used  Substance and Sexual Activity  . Alcohol use: Yes    Comment: daily  . Drug use: No  . Sexual activity: Never  Other Topics Concern  . Not on file  Social History Narrative  . Not on file   Social Determinants of Health   Financial Resource Strain:   . Difficulty of Paying Living Expenses: Not on file  Food Insecurity:   . Worried About Charity fundraiser in the Last Year: Not on file  . Ran Out of Food in the Last Year: Not on file  Transportation Needs:   .  Lack of Transportation (Medical): Not on file  . Lack of Transportation (Non-Medical): Not on file  Physical Activity:   . Days of Exercise per Week: Not on file  . Minutes of Exercise per Session: Not on file  Stress:   . Feeling of Stress : Not on file  Social Connections:   . Frequency of Communication with Friends and Family: Not on file  . Frequency of Social Gatherings with Friends and Family: Not on file  . Attends Religious Services: Not on file  . Active Member of Clubs or Organizations: Not on file  . Attends Archivist Meetings: Not on file  . Marital  Status: Not on file   Family History  Problem Relation Age of Onset  . Heart attack Father   . Diabetes Brother   . Hypertension Brother    Scheduled Meds: . Chlorhexidine Gluconate Cloth  6 each Topical Daily  . docusate sodium  100 mg Oral BID  . fluticasone  2 spray Each Nare QHS  . heparin injection (subcutaneous)  5,000 Units Subcutaneous Q8H  . hydrocortisone sod succinate (SOLU-CORTEF) inj  50 mg Intravenous Q6H  . memantine  14 mg Oral QHS  . montelukast  10 mg Oral QHS  . QUEtiapine  12.5 mg Oral QHS  . sodium chloride flush  3 mL Intravenous Q12H  . traZODone  50 mg Oral QHS   Continuous Infusions: . lactated ringers 100 mL/hr at 12/14/19 0815  . piperacillin-tazobactam (ZOSYN)  IV    . remdesivir 100 mg in NS 100 mL Stopped (12/13/19 1401)  . [START ON 12/15/2019] vancomycin     PRN Meds:.acetaminophen **OR** acetaminophen, ondansetron **OR** ondansetron (ZOFRAN) IV, polyethylene glycol No Known Allergies  Vital Signs: BP (!) 149/73 (BP Location: Right Arm)   Pulse (!) 115   Temp 98.8 F (37.1 C) (Axillary)   Resp 14   Ht 5' (1.524 m)   Wt 50.1 kg   SpO2 97%   BMI 21.57 kg/m  Pain Scale: PAINAD POSS *See Group Information*: 2-Acceptable,Slightly drowsy, easily aroused Pain Score: 0-No pain   SpO2: SpO2: 97 % O2 Device:SpO2: 97 % O2 Flow Rate: .O2 Flow Rate (L/min): 2 L/min  IO: Intake/output summary:   Intake/Output Summary (Last 24 hours) at 12/14/2019 1049 Last data filed at 12/14/2019 1000 Gross per 24 hour  Intake 3 ml  Output 1800 ml  Net -1797 ml    LBM: Last BM Date: 12/13/19 Baseline Weight: Weight: 49.9 kg Most recent weight: Weight: 50.1 kg     Palliative Assessment/Data: PPS 10%    The above conversation was completed via telephone due to the visitor restrictions during the COVID-19 pandemic. Thorough chart review and discussion with necessary members of the care team was completed as part of assessment. All issues were discussed and  addressed but no physical exam was performed.  Time Total: 30 minutes Greater than 50%  of this time was spent counseling and coordinating care related to the above assessment and plan.  Juel Burrow, DNP, AGNP-C Palliative Medicine Team [phone] Pager: [phone]

## 2019-12-14 NOTE — Progress Notes (Signed)
PROGRESS NOTE                                                                                                                                                                                                             Patient Demographics:    [name] Rice, is a 86 y.o. male, DOB - 23-Dec-1924, [id number]  Admit date - 12/12/2019   Admitting Physician Karmen Bongo, MD  Outpatient Primary MD for the patient is Caprice Renshaw, MD  LOS - 2   Chief Complaint  Patient presents with  . Altered Mental Status       Brief Narrative    86 y.o. male with medical history significant of dementia; remote ETOH dependence; CVA; HTN; DM; and prostate cancer presenting with AMS.  Per EMS, he is usually alert and ambulatory.  He is obtunded and unable to answer any questions at this time.  His work-up significant for COVID-19 pneumonia, and still coral proctitis septic with elevated lactic acid, AKI.  Patient has a signed and scanned advanced directive listing [name] as POA.  It says "no life support".  Additionally, he specifically requested no life-prolonged measures if he has advanced dementia that is not likely to improve.  This was signed on 02/07/19.  Patient is DNR.    Subjective:    [name] Rice today he remains  nonverbal, unable to provide any complaints, no significant events as discussed with staff .   Assessment  & Plan :    Principal Problem:   Sepsis due to undetermined organism [facility]) Active Problems:   Alzheimer's disease ([place])   Essential hypertension, benign   DNR (do not resuscitate)   Sepsis present on admission  -Sepsis present on admission including leukocytosis, tachycardia, tachypnea  and evidence of acute organ failure with elevated lactate >2; encephalopathy; creatinine >2 that is not easily explained by another condition. -Sepsis most likely in the setting of stercoral colitis and right lower lobe pneumonia. -Continue with  broad-spectrum antibiotic coverage including vancomycin and Zosyn. -Significant elevated procalcitonin at 31, trending down -Lactic acid trending down. -Blood cultures remains with no growth to date -On stress dose steroids, will start weaning.  COVID-19 pneumonia -Patient with evidence of opacity on imaging, will do inflammatory markers including CRP and D-dimers, started on IV remdesivir, as well he is hypoxic on 2 L nasal cannula. -  he is on IV steroids, currently on IV hydrocortisone, will transition to Decadron once weaned off. -Dimers trending down, it is less than 5 today, will change subcu heparin to 5000 units every 8 hours.  Acute renal failure -Prerenal secondary to volume depletion, improving with IV fluids  HTN -He no longer appears to be taking medications for this issue  Dementia/acute metabolic encephalopathy -Patient appears with underlying dementia with multiple medication occluding Namenda, Seroquel and trazodone. -He does remain significantly encephalopathic, only open his eyes to loud verbal stimuli, otherwise he remains noncommunicative, does not follow commands, unable to have any oral intake yet.     DNR -The patient has advanced directives in his chart, which is extremely helpful -He is a ward of the state and DSS was notified of the admission and the plan to abide by his previously stated wishes -Based on his ACP, he is DNR -Additionally, if he does not respond in a timely manner to the care plan, he has noted that he would not desire ongoing life-prolonging measures and should transition to comfort care. -Despite treatment with fluids, and antibiotics, patient remains significantly encephalopathic, unable to communicate, or have any oral intake, lethargic, have consulted palliative to discuss goals of care with legal guardian, as at this point it seems to be appropriate to proceed with comfort care if they agree.    COVID-19 Labs  Recent Labs     12/12/19 1213 12/12/19 1216 12/14/19 0313  DDIMER  --  6.12* 4.43*  FERRITIN 85  --   --   LDH 231*  --   --   CRP 31.7*  --  27.3*    Lab Results  Component Value Date   SARSCOV2NAA POSITIVE (A) 12/12/2019     Code Status : DNR  Family Communication  : Palliative  will discuss with legal guardian goals of care today  Disposition Plan  : Pending clinical progression  Barriers For Discharge : Still septic, on IV steroids and antibiotics  Consults  :  None  Procedures  : None  DVT Prophylaxis  :  [place] heparin  Lab Results  Component Value Date   PLT 215 12/14/2019    Antibiotics  :    Anti-infectives (From admission, onward)   Start     Dose/Rate Route Frequency Ordered Stop   12/15/19 2000  vancomycin (VANCOREADY) IVPB 750 mg/150 mL     750 mg 150 mL/hr over 60 Minutes Intravenous Every 48 hours 12/14/19 0906     12/14/19 1400  piperacillin-tazobactam (ZOSYN) IVPB 3.375 g     3.375 g 12.5 mL/hr over 240 Minutes Intravenous Every 8 hours 12/14/19 0901     12/14/19 0800  vancomycin (VANCOREADY) IVPB 500 mg/100 mL  Status:  Discontinued     500 mg 100 mL/hr over 60 Minutes Intravenous Every 48 hours 12/12/19 0655 12/14/19 0906   12/13/19 1400  piperacillin-tazobactam (ZOSYN) IVPB 2.25 g  Status:  Discontinued     2.25 g 100 mL/hr over 30 Minutes Intravenous Every 8 hours 12/13/19 0838 12/14/19 0901   12/13/19 1000  remdesivir 100 mg in sodium chloride 0.9 % 100 mL IVPB     100 mg 200 mL/hr over 30 Minutes Intravenous Daily 12/12/19 1110 12/17/19 0959   12/12/19 1400  piperacillin-tazobactam (ZOSYN) IVPB 3.375 g  Status:  Discontinued     3.375 g 12.5 mL/hr over 240 Minutes Intravenous Every 8 hours 12/12/19 0655 12/13/19 0838   12/12/19 1115  remdesivir 200 mg in sodium chloride  0.9% 250 mL IVPB     200 mg 580 mL/hr over 30 Minutes Intravenous Once 12/12/19 1110 12/12/19 1457   12/12/19 0730  vancomycin (VANCOCIN) IVPB 1000 mg/200 mL premix     1,000 mg 200  mL/hr over 60 Minutes Intravenous  Once 12/12/19 0655 12/12/19 0832   12/12/19 0615  piperacillin-tazobactam (ZOSYN) IVPB 3.375 g     3.375 g 100 mL/hr over 30 Minutes Intravenous  Once 12/12/19 0609 12/12/19 0717        Objective:   Vitals:   12/13/19 1930 12/14/19 0414 12/14/19 0527 12/14/19 0730  BP:      Pulse:  (!) 107  (!) 115  Resp:  19  14  Temp: 98.3 F (36.8 C)  98.8 F (37.1 C)   TempSrc:   Axillary   SpO2:  94%  97%  Weight:  50.1 kg    Height:        Wt Readings from Last 3 Encounters:  12/14/19 50.1 kg  04/10/17 54 kg  03/06/17 54 kg     Intake/Output Summary (Last 24 hours) at 12/14/2019 1115 Last data filed at 12/14/2019 1000 Gross per 24 hour  Intake 3 ml  Output 1800 ml  Net -1797 ml     Physical Exam  Patient is lethargic, only open eyes for few seconds to loud verbal stimuli, otherwise he is noncommunicative, nonverbal, does not follow any commands or answer any questions . Symmetrical chest wall movement bilaterally, no wheezing  Regular rate and rhythm, no rubs or gallops  Abdomen soft, nontender, bowel sounds present  No Cyanosis, Clubbing or edema, No new Rash or bruise      Data Review:    CBC Recent Labs  Lab 12/12/19 0130 12/13/19 0536 12/14/19 0313  WBC 16.5* 11.4* 11.6*  HGB 16.9 13.9 13.0  HCT 50.4 41.4 38.1*  PLT 279 250 215  MCV 95.6 94.1 93.8  MCH 32.1 31.6 32.0  MCHC 33.5 33.6 34.1  RDW 14.8 15.1 15.2  LYMPHSABS 1.2  --   --   MONOABS 1.0  --   --   EOSABS 0.0  --   --   BASOSABS 0.0  --   --     Chemistries  Recent Labs  Lab 12/12/19 0350 12/13/19 0536 12/14/19 0313  NA 138 143 144  K 5.1 4.5 3.4*  CL 107 106 113*  CO2 15* 19* 20*  GLUCOSE 92 116* 121*  BUN 44* 55* 43*  CREATININE 2.13* 1.98* 1.40*  CALCIUM 8.0* 8.4* 7.8*  MG 1.6*  --   --   AST 34  --  62*  ALT 14  --  31  ALKPHOS 34*  --  49  BILITOT 0.8  --  1.0    ------------------------------------------------------------------------------------------------------------------ No results for input(s): CHOL, HDL, LDLCALC, TRIG, CHOLHDL, LDLDIRECT in the last 72 hours.  Lab Results  Component Value Date   HGBA1C 5.9 (H) 07/26/2015   ------------------------------------------------------------------------------------------------------------------ Recent Labs    12/12/19 0650  TSH 2.057   ------------------------------------------------------------------------------------------------------------------ Recent Labs    12/12/19 0650 12/12/19 1213  VITAMINB12 461  --   FERRITIN  --  85    Coagulation profile No results for input(s): INR, PROTIME in the last 168 hours.  Recent Labs    12/12/19 1216 12/14/19 0313  DDIMER 6.12* 4.43*    Cardiac Enzymes Recent Labs  Lab 12/12/19 0650  CKMB 9.3*   ------------------------------------------------------------------------------------------------------------------    Component Value Date/Time   BNP 65.6 07/25/2015 1640  Inpatient Medications  Scheduled Meds: . Chlorhexidine Gluconate Cloth  6 each Topical Daily  . docusate sodium  100 mg Oral BID  . fluticasone  2 spray Each Nare QHS  . heparin injection (subcutaneous)  5,000 Units Subcutaneous Q8H  . hydrocortisone sod succinate (SOLU-CORTEF) inj  50 mg Intravenous Q6H  . memantine  14 mg Oral QHS  . montelukast  10 mg Oral QHS  . QUEtiapine  12.5 mg Oral QHS  . sodium chloride flush  3 mL Intravenous Q12H  . traZODone  50 mg Oral QHS   Continuous Infusions: . lactated ringers 100 mL/hr at 12/14/19 0815  . piperacillin-tazobactam (ZOSYN)  IV    . remdesivir 100 mg in NS 100 mL Stopped (12/13/19 1401)  . [START ON 12/15/2019] vancomycin     PRN Meds:.acetaminophen **OR** acetaminophen, ondansetron **OR** ondansetron (ZOFRAN) IV, polyethylene glycol  Micro Results Recent Results (from the past 240 hour(s))  Culture, blood  (routine x 2)     Status: None (Preliminary result)   Collection Time: 12/12/19  1:40 AM   Specimen: BLOOD LEFT ARM  Result Value Ref Range Status   Specimen Description BLOOD LEFT ARM  Final   Special Requests   Final    BOTTLES DRAWN AEROBIC AND ANAEROBIC Blood Culture adequate volume   Culture   Final    NO GROWTH 2 DAYS Performed at [facility], [address] [place], [place] [address]    Report Status PENDING  Incomplete  Culture, blood (routine x 2)     Status: None (Preliminary result)   Collection Time: 12/12/19  1:40 AM   Specimen: BLOOD RIGHT ARM  Result Value Ref Range Status   Specimen Description BLOOD RIGHT ARM  Final   Special Requests   Final    BOTTLES DRAWN AEROBIC AND ANAEROBIC Blood Culture adequate volume   Culture   Final    NO GROWTH 2 DAYS Performed at [facility], [place] [address], [place], [place] [address]    Report Status PENDING  Incomplete  SARS CORONAVIRUS 2 (TAT 6-24 HRS) Nasopharyngeal Nasopharyngeal Swab     Status: Abnormal   Collection Time: 12/12/19  3:13 AM   Specimen: Nasopharyngeal Swab  Result Value Ref Range Status   SARS Coronavirus 2 POSITIVE (A) NEGATIVE Final    Comment: RESULT CALLED TO, READ BACK BY AND VERIFIED WITH: CRYSTAL BAIN RN.@1014  ON 2.14.21 BY TCALDWELL MT. (NOTE) SARS-CoV-2 target nucleic acids are DETECTED. The SARS-CoV-2 RNA is generally detectable in upper and lower respiratory specimens during the acute phase of infection. Positive results are indicative of the presence of SARS-CoV-2 RNA. Clinical correlation with patient history and other diagnostic information is  necessary to determine patient infection status. Positive results do not rule out bacterial infection or co-infection with other viruses.  The expected result is Negative. Fact Sheet for Patients: SugarRoll.be Fact Sheet for Healthcare Providers: https://www.woods-mathews.com/ This test is not  yet approved or cleared by the [place] FDA and  has been authorized for detection and/or diagnosis of SARS-CoV-2 by FDA under an Emergency Use Authorization (EUA). This EUA will remain  in effect (meaning this test can be  used) for the duration of the COVID-19 declaration under Section 564(b)(1) of the Act, 21 U.S.C. section 360bbb-3(b)(1), unless the authorization is terminated or revoked sooner. Performed at [facility], [place] [address] [place], [place] [address]   Culture, Urine     Status: None   Collection Time: 12/12/19  3:03 PM   Specimen: Urine, Random  Result Value Ref Range Status   Specimen Description URINE, RANDOM  Final   Special Requests NONE  Final   Culture   Final    NO GROWTH Performed at [facility], [address], [place], [place] [address]    Report Status 12/13/2019 FINAL  Final  C difficile quick scan w PCR reflex     Status: None   Collection Time: 12/12/19  9:50 PM   Specimen: STOOL  Result Value Ref Range Status   C Diff antigen NEGATIVE NEGATIVE Final   C Diff toxin NEGATIVE NEGATIVE Final   C Diff interpretation No C. difficile detected.  Final    Comment: Performed at [facility], [place] [address], [place], [place] [address]    Radiology Reports CT ABDOMEN PELVIS WO CONTRAST  Result Date: 12/12/2019 CLINICAL DATA:  Lethargic over the last few days. Diverticulitis suspected clinically. EXAM: CT ABDOMEN AND PELVIS WITHOUT CONTRAST TECHNIQUE: Multidetector CT imaging of the abdomen and pelvis was performed following the standard protocol without IV contrast. COMPARISON:  11/02/2015 FINDINGS: Lower chest: Large hiatal hernia containing the majority of the stomach. Patchy density at the right lung base could represent mild right lower lobe pneumonia. Hepatobiliary: Chronic cyst of the left lobe. No acute liver parenchymal finding. Previously seen vascular anomaly of the lateral aspect of the right lobe not visible without contrast.  Gallbladder is fairly distended but no calcified gallstones are seen. Pancreas: Atrophic changes.  No acute finding. Spleen: Normal Adrenals/Urinary Tract: No adrenal lesion. Simple cyst upper pole left kidney. Simple cyst lower pole left kidney. Few small nonobstructing renal calculi. Catheter in the bladder. The bladder is thick-walled. Stomach/Bowel: Apparent rectal fecal impaction with possible proctitis. Surrounding edema. No sign of diverticulitis. Vascular/Lymphatic: Aortic atherosclerosis. No aneurysm. IVC is normal. No retroperitoneal adenopathy. Reproductive: Negative Other: No free fluid or air. Musculoskeletal: Chronic degenerative changes affect the lumbar spine. Old appearing compression deformities at T9 and T12. IMPRESSION: Large hiatal hernia containing the majority the stomach. Patchy density in the right lower lobe that could represent mild right lower lobe pneumonia. Fecal impaction with wall thickening of the rectum and surrounding stranding suggesting stercoral proctitis. No evidence of diverticulitis. Bladder catheter in place.  Thick-walled bladder. Aortic atherosclerosis. Electronically Signed   By: Nelson Chimes M.D.   On: 12/12/2019 05:56   CT Head Wo Contrast  Result Date: 12/12/2019 CLINICAL DATA:  Altered mental status. EXAM: CT HEAD WITHOUT CONTRAST TECHNIQUE: Contiguous axial images were obtained from the base of the skull through the vertex without intravenous contrast. COMPARISON:  02/26/2013 FINDINGS: Brain: No evidence of acute infarction, hemorrhage, hydrocephalus, extra-axial collection or mass lesion/mass effect. There is mild diffuse low-attenuation within the subcortical and periventricular white matter compatible with chronic microvascular disease. Prominent sulci and ventricles compatible with brain atrophy. Vascular: No hyperdense vessel or unexpected calcification. Skull: Normal. Negative for fracture or focal lesion. Sinuses/Orbits: No acute finding. Other: None  IMPRESSION: 1. No acute intracranial abnormalities. 2. Chronic small vessel ischemic disease and brain atrophy. Electronically Signed   By: Kerby Moors M.D.   On: 12/12/2019 06:04   DG Chest Port 1 View  Result Date: 12/12/2019 CLINICAL DATA:  Altered mental status, lethargy, dementia EXAM: PORTABLE CHEST 1 VIEW COMPARISON:  10/29/2015 FINDINGS: Lungs are essentially clear. Mild platelike scarring/atelectasis at the right lung base. No pleural effusion or pneumothorax. The heart is normal in size. Moderate hiatal hernia. IMPRESSION: No evidence of  acute cardiopulmonary disease. Electronically Signed   By: Julian Hy M.D.   On: 12/12/2019 02:20     Phillips Climes M.D on 12/14/2019 at 11:15 AM  Between 7am to 7pm - Pager - [phone]  After 7pm go to [email or web address] - password [facility]  [facility] -  Office  [phone]

## 2019-12-15 DIAGNOSIS — Z7189 Other specified counseling: Secondary | ICD-10-CM

## 2019-12-15 DIAGNOSIS — Z515 Encounter for palliative care: Secondary | ICD-10-CM

## 2019-12-15 LAB — CBC
HCT: 33.1 % — ABNORMAL LOW (ref 39.0–52.0)
Hemoglobin: 11.3 g/dL — ABNORMAL LOW (ref 13.0–17.0)
MCH: 32 pg (ref 26.0–34.0)
MCHC: 34.1 g/dL (ref 30.0–36.0)
MCV: 93.8 fL (ref 80.0–100.0)
Platelets: 182 10*3/uL (ref 150–400)
RBC: 3.53 MIL/uL — ABNORMAL LOW (ref 4.22–5.81)
RDW: 15.2 % (ref 11.5–15.5)
WBC: 9.9 10*3/uL (ref 4.0–10.5)
nRBC: 0 % (ref 0.0–0.2)

## 2019-12-15 LAB — COMPREHENSIVE METABOLIC PANEL
ALT: 37 U/L (ref 0–44)
AST: 67 U/L — ABNORMAL HIGH (ref 15–41)
Albumin: 2.1 g/dL — ABNORMAL LOW (ref 3.5–5.0)
Alkaline Phosphatase: 39 U/L (ref 38–126)
Anion gap: 14 (ref 5–15)
BUN: 27 mg/dL — ABNORMAL HIGH (ref 8–23)
CO2: 19 mmol/L — ABNORMAL LOW (ref 22–32)
Calcium: 8 mg/dL — ABNORMAL LOW (ref 8.9–10.3)
Chloride: 117 mmol/L — ABNORMAL HIGH (ref 98–111)
Creatinine, Ser: 1.11 mg/dL (ref 0.61–1.24)
GFR calc Af Amer: >60 mL/min (ref >60)
GFR calc non Af Amer: 57 mL/min — ABNORMAL LOW (ref >60)
Glucose, Bld: 83 mg/dL (ref 70–99)
Potassium: 3 mmol/L — ABNORMAL LOW (ref 3.5–5.1)
Sodium: 150 mmol/L — ABNORMAL HIGH (ref 135–145)
Total Bilirubin: 1.7 mg/dL — ABNORMAL HIGH (ref 0.3–1.2)
Total Protein: 4.8 g/dL — ABNORMAL LOW (ref 6.5–8.1)

## 2019-12-15 LAB — MRSA PCR SCREENING: MRSA by PCR: NEGATIVE

## 2019-12-15 MED ORDER — LORAZEPAM 2 MG/ML IJ SOLN
1.0000 mg | INTRAMUSCULAR | Status: DC | PRN
  Administered 2019-12-15 – 2019-12-16 (×3): 1 mg via INTRAVENOUS
  Filled 2019-12-15 (×3): qty 1

## 2019-12-15 MED ORDER — HALOPERIDOL 0.5 MG PO TABS: 0.5000 mg | ORAL_TABLET | ORAL | Status: DC | PRN

## 2019-12-15 MED ORDER — GLYCOPYRROLATE 0.2 MG/ML IJ SOLN: 0.2000 mg | INTRAMUSCULAR | Status: DC | PRN

## 2019-12-15 MED ORDER — HALOPERIDOL LACTATE 5 MG/ML IJ SOLN
0.5000 mg | INTRAMUSCULAR | Status: DC | PRN
  Administered 2019-12-16: 0.5 mg via INTRAVENOUS
  Filled 2019-12-15: qty 1

## 2019-12-15 MED ORDER — HALOPERIDOL LACTATE 2 MG/ML PO CONC
0.5000 mg | ORAL | Status: DC | PRN
  Filled 2019-12-15: qty 0.3

## 2019-12-15 MED ORDER — MORPHINE SULFATE (PF) 2 MG/ML IV SOLN
2.0000 mg | INTRAVENOUS | Status: DC | PRN
  Administered 2019-12-16: 2 mg via INTRAVENOUS
  Filled 2019-12-15: qty 1

## 2019-12-15 MED ORDER — LORAZEPAM 2 MG/ML PO CONC: 1.0000 mg | ORAL | Status: DC | PRN

## 2019-12-15 MED ORDER — GLYCOPYRROLATE 1 MG PO TABS: 1.0000 mg | ORAL_TABLET | ORAL | Status: DC | PRN

## 2019-12-15 MED ORDER — LORAZEPAM 1 MG PO TABS: 1.0000 mg | ORAL_TABLET | ORAL | Status: DC | PRN

## 2019-12-15 MED ORDER — BIOTENE DRY MOUTH MT LIQD
15.0000 mL | Freq: Two times a day (BID) | OROMUCOSAL | Status: DC
  Administered 2019-12-15 – 2019-12-16 (×2): 15 mL via TOPICAL

## 2019-12-15 NOTE — Progress Notes (Addendum)
PROGRESS NOTE                                                                                                                                                                                                             Patient Demographics:    [name] Rice, is a 86 y.o. male, DOB - 1925-03-31, [id number]  Admit date - 12/12/2019   Admitting Physician Karmen Bongo, MD  Outpatient Primary MD for the patient is Caprice Renshaw, MD  LOS - 3   Chief Complaint  Patient presents with  . Altered Mental Status       Brief Narrative    86 y.o. male with medical history significant of dementia; remote ETOH dependence; CVA; HTN; DM; and prostate cancer brought to the ED for evaluation of altered mental status-found to have Covid pneumonia, stercoral proctitis along with sepsis and AKI.  Patient was admitted to the hospitalist service and started on IV fluids, IV antibiotics-unfortunately continued to have persistent encephalopathy and electrolyte abnormalities-palliative care was consulted-after discussion with legal guardian-patient was transitioned to full comfort measures on 2/17.     Subjective:   Remains obtunded.   Assessment  & Plan :   Sepsis secondary to COVID-19 pneumonia-concurrent bacterial pneumonia and stercoral colitis (POA): Continues to have severe encephalopathy-with very poor oral intake and failure to thrive syndrome in spite of treatment with IV fluids and IV antibiotics.  Blood/urine cultures negative so far.  Palliative care following-after discussion with legal guardian-has been transitioned to full comfort measures.  No longer on any IV antibiotics.  Acute renal failure:Prerenal secondary to volume depletion-no further electrolyte monitoring required-as transition to full comfort care.  Hypernatremia: Secondary to poor oral intake-no further electrolyte monitoring required-as transition to full comfort care.  HTN:He no longer  appears to be taking medications for this issue  Acute metabolic encephalopathy superimposed on dementia:Secondary to sepsis, AKI-continues to very lethargic-essentially obtunded today.  Transitioning to comfort measures.   Code Status : DNR  Family Communication  : Palliative  discussed with legal guardian today  Disposition Plan  : SNF with hospice  Barriers For Discharge : Awaiting SNF with hospice-just transition to palliative care today  Consults  :  None  Procedures  : None  DVT Prophylaxis  : Not needed-full comfort measures  Lab Results  Component Value Date   PLT  182 12/15/2019    Antibiotics  :    Anti-infectives (From admission, onward)   Start     Dose/Rate Route Frequency Ordered Stop   12/15/19 2000  vancomycin (VANCOREADY) IVPB 750 mg/150 mL  Status:  Discontinued     750 mg 150 mL/hr over 60 Minutes Intravenous Every 48 hours 12/14/19 0906 12/15/19 1119   12/14/19 1400  piperacillin-tazobactam (ZOSYN) IVPB 3.375 g  Status:  Discontinued     3.375 g 12.5 mL/hr over 240 Minutes Intravenous Every 8 hours 12/14/19 0901 12/15/19 1119   12/14/19 0800  vancomycin (VANCOREADY) IVPB 500 mg/100 mL  Status:  Discontinued     500 mg 100 mL/hr over 60 Minutes Intravenous Every 48 hours 12/12/19 0655 12/14/19 0906   12/13/19 1400  piperacillin-tazobactam (ZOSYN) IVPB 2.25 g  Status:  Discontinued     2.25 g 100 mL/hr over 30 Minutes Intravenous Every 8 hours 12/13/19 0838 12/14/19 0901   12/13/19 1000  remdesivir 100 mg in sodium chloride 0.9 % 100 mL IVPB  Status:  Discontinued     100 mg 200 mL/hr over 30 Minutes Intravenous Daily 12/12/19 1110 12/15/19 1119   12/12/19 1400  piperacillin-tazobactam (ZOSYN) IVPB 3.375 g  Status:  Discontinued     3.375 g 12.5 mL/hr over 240 Minutes Intravenous Every 8 hours 12/12/19 0655 12/13/19 0838   12/12/19 1115  remdesivir 200 mg in sodium chloride 0.9% 250 mL IVPB     200 mg 580 mL/hr over 30 Minutes Intravenous Once  12/12/19 1110 12/12/19 1457   12/12/19 0730  vancomycin (VANCOCIN) IVPB 1000 mg/200 mL premix     1,000 mg 200 mL/hr over 60 Minutes Intravenous  Once 12/12/19 0655 12/12/19 0832   12/12/19 0615  piperacillin-tazobactam (ZOSYN) IVPB 3.375 g     3.375 g 100 mL/hr over 30 Minutes Intravenous  Once 12/12/19 0609 12/12/19 0717        Objective:   Vitals:   12/14/19 2200 12/15/19 0000 12/15/19 0407 12/15/19 1222  BP:  131/69 138/60 (!) 114/99  Pulse: (!) 103 (!) 103 100 90  Resp: 19 20 18 12   Temp:   97.7 F (36.5 C) 98.1 F (36.7 C)  TempSrc:   Oral Axillary  SpO2: 98% 97% 99% 97%  Weight:      Height:        Wt Readings from Last 3 Encounters:  12/14/19 50.1 kg  04/10/17 54 kg  03/06/17 54 kg     Intake/Output Summary (Last 24 hours) at 12/15/2019 1501 Last data filed at 12/15/2019 1121 Gross per 24 hour  Intake 3292.51 ml  Output 600 ml  Net 2692.51 ml     Physical Exam Lethargic-obtunded-does not respond to verbal or gentle physical stimuli    Data Review:    CBC Recent Labs  Lab 12/12/19 0130 12/13/19 0536 12/14/19 0313 12/15/19 0435  WBC 16.5* 11.4* 11.6* 9.9  HGB 16.9 13.9 13.0 11.3*  HCT 50.4 41.4 38.1* 33.1*  PLT 279 250 215 182  MCV 95.6 94.1 93.8 93.8  MCH 32.1 31.6 32.0 32.0  MCHC 33.5 33.6 34.1 34.1  RDW 14.8 15.1 15.2 15.2  LYMPHSABS 1.2  --   --   --   MONOABS 1.0  --   --   --   EOSABS 0.0  --   --   --   BASOSABS 0.0  --   --   --     Chemistries  Recent Labs  Lab 12/12/19 0350 12/13/19 0536  12/14/19 0313 12/15/19 0435  NA 138 143 144 150*  K 5.1 4.5 3.4* 3.0*  CL 107 106 113* 117*  CO2 15* 19* 20* 19*  GLUCOSE 92 116* 121* 83  BUN 44* 55* 43* 27*  CREATININE 2.13* 1.98* 1.40* 1.11  CALCIUM 8.0* 8.4* 7.8* 8.0*  MG 1.6*  --   --   --   AST 34  --  62* 67*  ALT 14  --  31 37  ALKPHOS 34*  --  49 39  BILITOT 0.8  --  1.0 1.7*    ------------------------------------------------------------------------------------------------------------------ No results for input(s): CHOL, HDL, LDLCALC, TRIG, CHOLHDL, LDLDIRECT in the last 72 hours.  Lab Results  Component Value Date   HGBA1C 5.9 (H) 07/26/2015   ------------------------------------------------------------------------------------------------------------------ No results for input(s): TSH, T4TOTAL, T3FREE, THYROIDAB in the last 72 hours.  Invalid input(s): FREET3 ------------------------------------------------------------------------------------------------------------------ No results for input(s): VITAMINB12, FOLATE, FERRITIN, TIBC, IRON, RETICCTPCT in the last 72 hours.  Coagulation profile No results for input(s): INR, PROTIME in the last 168 hours.  Recent Labs    12/14/19 0313  DDIMER 4.43*    Cardiac Enzymes Recent Labs  Lab 12/12/19 0650  CKMB 9.3*   ------------------------------------------------------------------------------------------------------------------    Component Value Date/Time   BNP 65.6 07/25/2015 1640    Inpatient Medications  Scheduled Meds: . antiseptic oral rinse  15 mL Topical BID  . bisacodyl  10 mg Rectal BID  . sodium chloride flush  3 mL Intravenous Q12H   Continuous Infusions:  PRN Meds:.[DISCONTINUED] glycopyrrolate **OR** glycopyrrolate **OR** glycopyrrolate, [DISCONTINUED] haloperidol **OR** haloperidol **OR** haloperidol lactate, [DISCONTINUED] LORazepam **OR** LORazepam **OR** LORazepam, morphine injection, [DISCONTINUED] ondansetron **OR** ondansetron (ZOFRAN) IV  Micro Results Recent Results (from the past 240 hour(s))  Culture, blood (routine x 2)     Status: None (Preliminary result)   Collection Time: 12/12/19  1:40 AM   Specimen: BLOOD LEFT ARM  Result Value Ref Range Status   Specimen Description BLOOD LEFT ARM  Final   Special Requests   Final    BOTTLES DRAWN AEROBIC AND ANAEROBIC Blood  Culture adequate volume   Culture   Final    NO GROWTH 3 DAYS Performed at [facility], [place] [address] [place], [place] [address]    Report Status PENDING  Incomplete  Culture, blood (routine x 2)     Status: None (Preliminary result)   Collection Time: 12/12/19  1:40 AM   Specimen: BLOOD RIGHT ARM  Result Value Ref Range Status   Specimen Description BLOOD RIGHT ARM  Final   Special Requests   Final    BOTTLES DRAWN AEROBIC AND ANAEROBIC Blood Culture adequate volume   Culture   Final    NO GROWTH 3 DAYS Performed at [facility], [address], [place], [place] [address]    Report Status PENDING  Incomplete  SARS CORONAVIRUS 2 (TAT 6-24 HRS) Nasopharyngeal Nasopharyngeal Swab     Status: Abnormal   Collection Time: 12/12/19  3:13 AM   Specimen: Nasopharyngeal Swab  Result Value Ref Range Status   SARS Coronavirus 2 POSITIVE (A) NEGATIVE Final    Comment: RESULT CALLED TO, READ BACK BY AND VERIFIED WITH: CRYSTAL BAIN RN.@1014  ON 2.14.21 BY TCALDWELL MT. (NOTE) SARS-CoV-2 target nucleic acids are DETECTED. The SARS-CoV-2 RNA is generally detectable in upper and lower respiratory specimens during the acute phase of infection. Positive results are indicative of the presence of SARS-CoV-2 RNA. Clinical correlation with patient history and other diagnostic information is  necessary to determine patient infection status. Positive results do not rule out bacterial infection or co-infection with other viruses.  The expected result is Negative. Fact Sheet for Patients: SugarRoll.be Fact Sheet for Healthcare Providers: https://www.woods-mathews.com/ This test is not yet approved or cleared by the [place] FDA and  has been authorized for detection and/or diagnosis of SARS-CoV-2 by FDA under an Emergency Use Authorization (EUA). This EUA will remain  in effect (meaning this test can be  used) for the duration of the COVID-19  declaration under Section 564(b)(1) of the Act, 21 U.S.C. section 360bbb-3(b)(1), unless the authorization is terminated or revoked sooner. Performed at [facility], [place] [address], [place], [place] [address]   Culture, Urine     Status: None   Collection Time: 12/12/19  3:03 PM   Specimen: Urine, Random  Result Value Ref Range Status   Specimen Description URINE, RANDOM  Final   Special Requests NONE  Final   Culture   Final    NO GROWTH Performed at [facility], [place] [address] [place], [place] [address]    Report Status 12/13/2019 FINAL  Final  C difficile quick scan w PCR reflex     Status: None   Collection Time: 12/12/19  9:50 PM   Specimen: STOOL  Result Value Ref Range Status   C Diff antigen NEGATIVE NEGATIVE Final   C Diff toxin NEGATIVE NEGATIVE Final   C Diff interpretation No C. difficile detected.  Final    Comment: Performed at [facility], [place] [address] [place], [place] [address]  MRSA PCR Screening     Status: None   Collection Time: 12/15/19  7:42 AM   Specimen: Nasopharyngeal  Result Value Ref Range Status   MRSA by PCR NEGATIVE NEGATIVE Final    Comment:        The GeneXpert MRSA Assay (FDA approved for NASAL specimens only), is one component of a comprehensive MRSA colonization surveillance program. It is not intended to diagnose MRSA infection nor to guide or monitor treatment for MRSA infections. Performed at [facility], [place] [address], [place], [place] [address]     Radiology Reports CT ABDOMEN PELVIS WO CONTRAST  Result Date: 12/12/2019 CLINICAL DATA:  Lethargic over the last few days. Diverticulitis suspected clinically. EXAM: CT ABDOMEN AND PELVIS WITHOUT CONTRAST TECHNIQUE: Multidetector CT imaging of the abdomen and pelvis was performed following the standard protocol without IV contrast. COMPARISON:  11/02/2015 FINDINGS: Lower chest: Large hiatal hernia containing the majority of the stomach. Patchy density at  the right lung base could represent mild right lower lobe pneumonia. Hepatobiliary: Chronic cyst of the left lobe. No acute liver parenchymal finding. Previously seen vascular anomaly of the lateral aspect of the right lobe not visible without contrast. Gallbladder is fairly distended but no calcified gallstones are seen. Pancreas: Atrophic changes.  No acute finding. Spleen: Normal Adrenals/Urinary Tract: No adrenal lesion. Simple cyst upper pole left kidney. Simple cyst lower pole left kidney. Few small nonobstructing renal calculi. Catheter in the bladder. The bladder is thick-walled. Stomach/Bowel: Apparent rectal fecal impaction with possible proctitis. Surrounding edema. No sign of diverticulitis. Vascular/Lymphatic: Aortic atherosclerosis. No aneurysm. IVC is normal. No retroperitoneal adenopathy. Reproductive: Negative Other: No free fluid or air. Musculoskeletal: Chronic degenerative changes affect the lumbar spine. Old appearing compression deformities at T9 and T12. IMPRESSION: Large hiatal hernia containing the majority the stomach. Patchy density in the right lower lobe that could represent mild right  lower lobe pneumonia. Fecal impaction with wall thickening of the rectum and surrounding stranding suggesting stercoral proctitis. No evidence of diverticulitis. Bladder catheter in place.  Thick-walled bladder. Aortic atherosclerosis. Electronically Signed   By: Nelson Chimes M.D.   On: 12/12/2019 05:56   CT Head Wo Contrast  Result Date: 12/12/2019 CLINICAL DATA:  Altered mental status. EXAM: CT HEAD WITHOUT CONTRAST TECHNIQUE: Contiguous axial images were obtained from the base of the skull through the vertex without intravenous contrast. COMPARISON:  02/26/2013 FINDINGS: Brain: No evidence of acute infarction, hemorrhage, hydrocephalus, extra-axial collection or mass lesion/mass effect. There is mild diffuse low-attenuation within the subcortical and periventricular white matter compatible with  chronic microvascular disease. Prominent sulci and ventricles compatible with brain atrophy. Vascular: No hyperdense vessel or unexpected calcification. Skull: Normal. Negative for fracture or focal lesion. Sinuses/Orbits: No acute finding. Other: None IMPRESSION: 1. No acute intracranial abnormalities. 2. Chronic small vessel ischemic disease and brain atrophy. Electronically Signed   By: Kerby Moors M.D.   On: 12/12/2019 06:04   DG Chest Port 1 View  Result Date: 12/12/2019 CLINICAL DATA:  Altered mental status, lethargy, dementia EXAM: PORTABLE CHEST 1 VIEW COMPARISON:  10/29/2015 FINDINGS: Lungs are essentially clear. Mild platelike scarring/atelectasis at the right lung base. No pleural effusion or pneumothorax. The heart is normal in size. Moderate hiatal hernia. IMPRESSION: No evidence of acute cardiopulmonary disease. Electronically Signed   By: Julian Hy M.D.   On: 12/12/2019 02:20     Oren Binet M.D on 12/15/2019 at 3:01 PM  Between 7am to 7pm - Pager - [phone]  After 7pm go to [email or web address] - password [facility]  [facility] -  Office  [phone]

## 2019-12-15 NOTE — Progress Notes (Signed)
Daily Progress Note   Patient Name: [name] Rice       Date: 12/15/2019 DOB: 04-Aug-1925  Age: 86 y.o. MRN#: [id number] Attending Physician: [name] Osgood, MD Primary Care Physician: [name] Renshaw, MD Admit Date: 12/12/2019  Reason for Consultation/Follow-up: Establishing goals of care  Subjective: Discussed with RN - Nonverbal, does not follow commands, no PO intake  Length of Stay: 3  Current Medications: Scheduled Meds:  . bisacodyl  10 mg Rectal BID  . Chlorhexidine Gluconate Cloth  6 each Topical Daily  . docusate sodium  100 mg Oral BID  . fluticasone  2 spray Each Nare QHS  . heparin injection (subcutaneous)  5,000 Units Subcutaneous Q8H  . hydrocortisone sod succinate (SOLU-CORTEF) inj  50 mg Intravenous Daily  . memantine  14 mg Oral QHS  . montelukast  10 mg Oral QHS  . QUEtiapine  12.5 mg Oral QHS  . sodium chloride flush  3 mL Intravenous Q12H  . traZODone  50 mg Oral QHS    Continuous Infusions: . lactated ringers 100 mL/hr at 12/15/19 0302  . piperacillin-tazobactam (ZOSYN)  IV 3.375 g (12/15/19 0507)  . remdesivir 100 mg in NS 100 mL 100 mg (12/15/19 0944)  . vancomycin      PRN Meds: acetaminophen **OR** acetaminophen, ondansetron **OR** ondansetron (ZOFRAN) IV, polyethylene glycol    Vital Signs: BP 138/60 (BP Location: Right Arm)   Pulse 100   Temp 97.7 F (36.5 C) (Oral)   Resp 18   Ht 5' (1.524 m)   Wt 50.1 kg   SpO2 99%   BMI 21.57 kg/m  SpO2: SpO2: 99 % O2 Device: O2 Device: Room Air O2 Flow Rate: O2 Flow Rate (L/min): 1 L/min  Intake/output summary:   Intake/Output Summary (Last 24 hours) at 12/15/2019 1111 Last data filed at 12/15/2019 0900 Gross per 24 hour  Intake 3292.51 ml  Output 350 ml  Net 2942.51 ml   LBM: Last BM Date: 12/15/19 Baseline  Weight: Weight: 49.9 kg Most recent weight: Weight: 50.1 kg       Palliative Assessment/Data: PPS 10%    Flowsheet Rows     Most Recent Value  Intake Tab  Referral Department  Hospitalist  Unit at Time of Referral  Cardiac/Telemetry Unit  Palliative Care Primary Diagnosis  Sepsis/Infectious Disease  Date Notified  12/13/19  Palliative Care Type  New Palliative care  Reason for referral  Clarify Goals of Care  Date of Admission  12/12/19  Date first seen by Palliative Care  12/14/19  # of days Palliative referral response time  1 Day(s)  # of days IP prior to Palliative referral  1  Clinical Assessment  Palliative Performance Scale Score  10%  Psychosocial & Spiritual Assessment  Palliative Care Outcomes      Patient Active Problem List   Diagnosis Date Noted  . Palliative care by specialist   . Sepsis due to undetermined organism ([name] Rice) 12/12/2019  . DNR (do not resuscitate) 12/12/2019  . Anxiety associated with depression 06/03/2016  . Allergic rhinitis 01/22/2016  . Essential hypertension, benign 11/17/2015  . Right inguinal hernia 07/10/2011  . Alzheimer's disease ([place]) 01/26/2009  . ADENOCARCINOMA, PROSTATE, GLEASON GRADE 7 10/29/2003  Palliative Care Assessment & Plan   HPI: 86 y.o. male  with past medical history of dementia, remote ETOH dependence, CVA, HTN, DM and prostate cancer admitted on 12/12/2019 with AMS/nonverbal.  Diagnosed with sepsis most likely in setting of stercoral proctitis and COVID-19 RLL pna.  Patient is obtunded with no PO intake. PMT consulted for [place].  Assessment: I have reviewed medical records including EPIC notes, labs and imaging, received report from RN and Dr. Sloan Rice, assessed the patient and then spoke with patient's legal guardian, [name] Rice,  to discuss diagnosis prognosis, [place], EOL wishes, disposition and options.  I introduced Palliative Medicine as specialized medical care for people living with serious illness. It focuses  on providing relief from the symptoms and stress of a serious illness. The goal is to improve quality of life for both the patient and the family.   We discussed his current illness and what it means in the larger context of his on-going co-morbidities.  Natural disease trajectory and expectations at EOL were discussed. Discussed current treatment and that patient has received appropriate medical treatment yet remains encephalopathic with no PO intake. Legal guardian [name] Rice agrees that comfort care is appropriate for Mr [name] Rice.  Discussed plan of returning to facility where he knows staff and may feel more comfortable with hospice to help care for him there. [name] Rice agrees to this.  Discussed patient's family - he has a daughter. [name] Rice plans to call and update her.   Questions and concerns were addressed. [name] Rice was encouraged to call with questions or concerns.   Recommendations/Plan: Transition to full comfort care - discontinue any measures not needed to promote comfort TOC consult for transfer back to facility with hospice  Goals of Care and Additional Recommendations: Limitations on Scope of Treatment: Full Comfort Care  Code Status: DNR  Prognosis:  < 2 weeks  Discharge Planning: [place] with Hospice  Care plan was discussed with legal guardian, [name] Rice, Dr. Sloan Leiter, RN - requested not to call daughter (legal guardian will update)  Thank you for allowing the Palliative Medicine Team to assist in the care of this patient.   Total Time 35 minutes Prolonged Time Billed  no       Greater than 50%  of this time was spent counseling and coordinating care related to the above assessment and plan.  [name] Burrow, DNP, [facility] Palliative Medicine Team Team Phone # [phone]  Pager [phone]

## 2019-12-15 NOTE — NC FL2 (Signed)
[place] LEVEL OF CARE SCREENING TOOL     IDENTIFICATION  Patient Name: [name] Rice Birthdate: [birth year] Sex: male Admission Date (Current Location): 12/12/2019  [facility] and [place] Number:  Herbalist and Address:  [facility] [place], [place] [address], [place], [place] [address]      Provider Number: [id number]  Attending Physician Name and Address:  Jonetta Osgood, MD  Relative Name and Phone Number:  Terrace Arabia, Legal Guardian [phone]    Current Level of Care: Hospital Recommended Level of Care: [place] Prior Approval Number:    Date Approved/Denied:   PASRR Number: [id number]  Discharge Plan: SNF    Current Diagnoses: Patient Active Problem List   Diagnosis Date Noted  . Goals of care, counseling/discussion   . Comfort measures only status   . Palliative care by specialist   . Sepsis due to undetermined organism ([place]) 12/12/2019  . DNR (do not resuscitate) 12/12/2019  . Anxiety associated with depression 06/03/2016  . Allergic rhinitis 01/22/2016  . Essential hypertension, benign 11/17/2015  . Right inguinal hernia 07/10/2011  . Alzheimer's disease ([place]) 01/26/2009  . ADENOCARCINOMA, PROSTATE, GLEASON GRADE 7 10/29/2003    Orientation RESPIRATION BLADDER Height & Weight     (Disoriented x4)  O2(Nasal cannula 2L) Incontinent, Indwelling catheter Weight: 110 lb 7.2 oz (50.1 kg) Height:  5' (152.4 cm)  BEHAVIORAL SYMPTOMS/MOOD NEUROLOGICAL BOWEL NUTRITION STATUS  (None; Dementia)   Incontinent Diet(Please see DC Summary)  AMBULATORY STATUS COMMUNICATION OF NEEDS Skin   Extensive Assist Verbally Normal                       Personal Care Assistance Level of Assistance  Bathing, Feeding, Dressing Bathing Assistance: Maximum assistance Feeding assistance: Maximum assistance Dressing Assistance: Maximum assistance     Functional Limitations Info  Sight, Hearing, Speech Sight Info:  Adequate Hearing Info: Adequate Speech Info: Adequate    SPECIAL CARE FACTORS FREQUENCY                       Contractures Contractures Info: Not present    Additional Factors Info  Code Status, Allergies, Isolation Precautions, Psychotropic Code Status Info: DNR Allergies Info: NKA Psychotropic Info: Seroquel; Trazadone   Isolation Precautions Info: COVID +     Current Medications (12/15/2019):  This is the current hospital active medication list Current Facility-Administered Medications  Medication Dose Route Frequency Provider Last Rate Last Admin  . antiseptic oral rinse (BIOTENE) solution 15 mL  15 mL Topical BID Philis Pique, NP   15 mL at 12/15/19 1224  . bisacodyl (DULCOLAX) suppository 10 mg  10 mg Rectal BID Elgergawy, Silver Huguenin, MD      . glycopyrrolate (ROBINUL) injection 0.2 mg  0.2 mg Subcutaneous Q4H PRN Shaffer, Johnell Comings, NP       Or  . glycopyrrolate (ROBINUL) injection 0.2 mg  0.2 mg Intravenous Q4H PRN Philis Pique, NP      . haloperidol (HALDOL) 2 MG/ML solution 0.5 mg  0.5 mg Sublingual Q4H PRN Philis Pique, NP       Or  . haloperidol lactate (HALDOL) injection 0.5 mg  0.5 mg Intravenous Q4H PRN Philis Pique, NP      . LORazepam (ATIVAN) 2 MG/ML concentrated solution 1 mg  1 mg Sublingual Q4H PRN Philis Pique, NP       Or  .  LORazepam (ATIVAN) injection 1 mg  1 mg Intravenous Q4H PRN Philis Pique, NP      . morphine 2 MG/ML injection 2 mg  2 mg Intravenous Q2H PRN Philis Pique, NP      . ondansetron [facility]) injection 4 mg  4 mg Intravenous Q6H PRN Karmen Bongo, MD      . sodium chloride flush (NS) 0.9 % injection 3 mL  3 mL Intravenous Q12H Karmen Bongo, MD   3 mL at 12/15/19 0940     Discharge Medications: Please see discharge summary for a list of discharge medications.  Relevant Imaging Results:  Relevant Lab Results:   Additional Information SSN: [id number]                 Add  Hospice at SNF.  Benard Halsted, LCSW

## 2019-12-16 DIAGNOSIS — I10 Essential (primary) hypertension: Secondary | ICD-10-CM

## 2019-12-16 MED ORDER — MORPHINE SULFATE (CONCENTRATE) 10 MG/0.5ML PO SOLN: 5.0000 mg | ORAL | 0 refills | Status: AC | PRN

## 2019-12-16 MED ORDER — LORAZEPAM 2 MG/ML PO CONC: 1.0000 mg | ORAL | 0 refills | Status: AC | PRN

## 2019-12-16 MED ORDER — MORPHINE SULFATE (CONCENTRATE) 10 MG/0.5ML PO SOLN: 5.0000 mg | ORAL | Status: DC | PRN

## 2019-12-16 MED ORDER — HALOPERIDOL LACTATE 2 MG/ML PO CONC: 1.0000 mg | ORAL | 0 refills | Status: AC | PRN

## 2019-12-16 MED ORDER — GLYCOPYRROLATE 0.2 MG/ML IJ SOLN: 0.2000 mg | INTRAMUSCULAR | Status: AC | PRN

## 2019-12-16 NOTE — Discharge Summary (Signed)
PATIENT DETAILS Name: [name] Rice Age: 86 y.o. Sex: male Date of Birth: 1925-01-05 MRN: [id number]. Admitting Physician: Karmen Bongo, MD [id number], Fara Olden, MD  Admit Date: 12/12/2019 Discharge date: 12/16/2019  Recommendations for Outpatient Follow-up:  1. Optimize comfort measures.  Admitted From:  SNF  Disposition: SNF with hospice care.   Home Health: No  Equipment/Devices: None  Discharge Condition: Stable  CODE STATUS:  DNR  Diet recommendation:  Diet Order            Diet - low sodium heart healthy        Diet NPO time specified  Diet effective now               Brief Summary: See H&P, Labs, Consult and Test reports for all details in brief,  86 y.o.malewith medical history significant ofdementia; remote ETOH dependence; CVA; HTN; DM; and prostate cancer brought to the ED for evaluation of altered mental status-found to have Covid pneumonia, stercoral proctitis along with sepsis and AKI.  Patient was admitted to the hospitalist service and started on IV fluids, IV antibiotics-unfortunately continued to have persistent encephalopathy and electrolyte abnormalities-palliative care was consulted-after discussion with legal guardian-patient was transitioned to full comfort measures on 2/17.   Brief Hospital Course: Sepsis secondary to COVID-19 pneumonia-concurrent bacterial pneumonia and stercoral colitis (POA): Continues to have severe encephalopathy-with very poor oral intake and failure to thrive syndrome inspite of treatment with IV fluids and IV antibiotics.  Blood/urine cultures negative so far.  Palliative care followed-after discussion with legal guardian-has been transitioned to full comfort measures.  No longer on any IV antibiotics.  New recommendations are to discharge back to SNF with hospice care.  Acute renal failure:Prerenal secondary to volume depletion-no further electrolyte monitoring required-as transitioned to full comfort  care.  Hypernatremia: Secondary to poor oral intake-no further electrolyte monitoring required-as transitioned to full comfort care.  HTN:He no longer appears to be taking medications for this issue  Acute metabolic encephalopathy superimposed on dementia:Secondary to sepsis, AKI-continues to very lethargic-essentially obtunded today.  Transitioned to comfort measures.  Procedures/Studies: None  Discharge Diagnoses:  Principal Problem:   Sepsis due to undetermined organism [facility]) Active Problems:   Alzheimer's disease ([place])   Essential hypertension, benign   DNR (do not resuscitate)   Palliative care by specialist   Goals of care, counseling/discussion   Comfort measures only status   Discharge Instructions:    Person Under Monitoring Name: [name] Rice  Location: [place] [address]   Infection Prevention Recommendations for Individuals Confirmed to have, or Being Evaluated for, 2019 Novel Coronavirus (COVID-19) Infection Who Receive Care at Home  Individuals who are confirmed to have, or are being evaluated for, COVID-19 should follow the prevention steps below until a healthcare provider or local or state health department says they can return to normal activities.  Stay home except to get medical care You should restrict activities outside your home, except for getting medical care. Do not go to work, school, or public areas, and do not use public transportation or taxis.  Call ahead before visiting your doctor Before your medical appointment, call the healthcare provider and tell them that you have, or are being evaluated for, COVID-19 infection. This will help the healthcare provider's office take steps to keep other people from getting infected. Ask your healthcare provider to call the local or state health department.  Monitor your symptoms Seek prompt medical attention if your illness is worsening (e.g., difficulty breathing). Before  going to your medical appointment, call the healthcare provider and tell them that you have, or are being evaluated for, COVID-19 infection. Ask your healthcare provider to call the local or state health department.  Wear a facemask You should wear a facemask that covers your nose and mouth when you are in the same room with other people and when you visit a healthcare provider. People who live with or visit you should also wear a facemask while they are in the same room with you.  Separate yourself from other people in your home As much as possible, you should stay in a different room from other people in your home. Also, you should use a separate bathroom, if available.  Avoid sharing household items You should not share dishes, drinking glasses, cups, eating utensils, towels, bedding, or other items with other people in your home. After using these items, you should wash them thoroughly with soap and water.  Cover your coughs and sneezes Cover your mouth and nose with a tissue when you cough or sneeze, or you can cough or sneeze into your sleeve. Throw used tissues in a lined trash can, and immediately wash your hands with soap and water for at least 20 seconds or use an alcohol-based hand rub.  Wash your [facility] your hands often and thoroughly with soap and water for at least 20 seconds. You can use an alcohol-based hand sanitizer if soap and water are not available and if your hands are not visibly dirty. Avoid touching your eyes, nose, and mouth with unwashed hands.   Prevention Steps for Caregivers and Household Members of Individuals Confirmed to have, or Being Evaluated for, COVID-19 Infection Being Cared for in the Home  If you live with, or provide care at home for, a person confirmed to have, or being evaluated for, COVID-19 infection please follow these guidelines to prevent infection:  Follow healthcare provider's instructions Make sure that you understand and can  help the patient follow any healthcare provider instructions for all care.  Provide for the patient's basic needs You should help the patient with basic needs in the home and provide support for getting groceries, prescriptions, and other personal needs.  Monitor the patient's symptoms If they are getting sicker, call his or her medical provider and tell them that the patient has, or is being evaluated for, COVID-19 infection. This will help the healthcare provider's office take steps to keep other people from getting infected. Ask the healthcare provider to call the local or state health department.  Limit the number of people who have contact with the patient  If possible, have only one caregiver for the patient.  Other household members should stay in another home or place of residence. If this is not possible, they should stay  in another room, or be separated from the patient as much as possible. Use a separate bathroom, if available.  Restrict visitors who do not have an essential need to be in the home.  Keep older adults, very young children, and other sick people away from the patient Keep older adults, very young children, and those who have compromised immune systems or chronic health conditions away from the patient. This includes people with chronic heart, lung, or kidney conditions, diabetes, and cancer.  Ensure good ventilation Make sure that shared spaces in the home have good air flow, such as from an air conditioner or an opened window, weather permitting.  Wash your hands often  Wash your hands often and  thoroughly with soap and water for at least 20 seconds. You can use an alcohol based hand sanitizer if soap and water are not available and if your hands are not visibly dirty.  Avoid touching your eyes, nose, and mouth with unwashed hands.  Use disposable paper towels to dry your hands. If not available, use dedicated cloth towels and replace them when they  become wet.  Wear a facemask and gloves  Wear a disposable facemask at all times in the room and gloves when you touch or have contact with the patient's blood, body fluids, and/or secretions or excretions, such as sweat, saliva, sputum, nasal mucus, vomit, urine, or feces.  Ensure the mask fits over your nose and mouth tightly, and do not touch it during use.  Throw out disposable facemasks and gloves after using them. Do not reuse.  Wash your hands immediately after removing your facemask and gloves.  If your personal clothing becomes contaminated, carefully remove clothing and launder. Wash your hands after handling contaminated clothing.  Place all used disposable facemasks, gloves, and other waste in a lined container before disposing them with other household waste.  Remove gloves and wash your hands immediately after handling these items.  Do not share dishes, glasses, or other household items with the patient  Avoid sharing household items. You should not share dishes, drinking glasses, cups, eating utensils, towels, bedding, or other items with a patient who is confirmed to have, or being evaluated for, COVID-19 infection.  After the person uses these items, you should wash them thoroughly with soap and water.  Wash laundry thoroughly  Immediately remove and wash clothes or bedding that have blood, body fluids, and/or secretions or excretions, such as sweat, saliva, sputum, nasal mucus, vomit, urine, or feces, on them.  Wear gloves when handling laundry from the patient.  Read and follow directions on labels of laundry or clothing items and detergent. In general, wash and dry with the warmest temperatures recommended on the label.  Clean all areas the individual has used often  Clean all touchable surfaces, such as counters, tabletops, doorknobs, bathroom fixtures, toilets, phones, keyboards, tablets, and bedside tables, every day. Also, clean any surfaces that may have  blood, body fluids, and/or secretions or excretions on them.  Wear gloves when cleaning surfaces the patient has come in contact with.  Use a diluted bleach solution (e.g., dilute bleach with 1 part bleach and 10 parts water) or a household disinfectant with a label that says EPA-registered for coronaviruses. To make a bleach solution at home, add 1 tablespoon of bleach to 1 quart (4 cups) of water. For a larger supply, add  cup of bleach to 1 gallon (16 cups) of water.  Read labels of cleaning products and follow recommendations provided on product labels. Labels contain instructions for safe and effective use of the cleaning product including precautions you should take when applying the product, such as wearing gloves or eye protection and making sure you have good ventilation during use of the product.  Remove gloves and wash hands immediately after cleaning.  Monitor yourself for signs and symptoms of illness Caregivers and household members are considered close contacts, should monitor their health, and will be asked to limit movement outside of the home to the extent possible. Follow the monitoring steps for close contacts listed on the symptom monitoring form.   ? If you have additional questions, contact your local health department or call the epidemiologist on call at [phone] (available 24/7). ?  This guidance is subject to change. For the most up-to-date guidance from [facility], please refer to their website: YouBlogs.pl    Activity:  As tolerated with Full fall precautions use walker/cane & assistance as needed   Discharge Instructions    Diet - low sodium heart healthy   Complete by: As directed    Comfort feeding as tolerated   Increase activity slowly   Complete by: As directed      Allergies as of 12/16/2019   No Known Allergies     Medication List    STOP taking these medications   acetaminophen  325 MG tablet Commonly known as: TYLENOL   Ensure   fluticasone 50 MCG/ACT nasal spray Commonly known as: FLONASE   geriatric multivitamins-minerals Liqd   montelukast 10 MG tablet Commonly known as: SINGULAIR   multivitamin tablet   Namenda XR 14 MG Cp24 24 hr capsule Generic drug: memantine   NUTRITIONAL SUPPLEMENT PO   QUEtiapine 25 MG tablet Commonly known as: SEROQUEL   traZODone 50 MG tablet Commonly known as: DESYREL   Vitamin D (Ergocalciferol) 1.25 MG (50000 UNIT) Caps capsule Commonly known as: DRISDOL     TAKE these medications   glycopyrrolate 0.2 MG/ML injection Commonly known as: ROBINUL Inject 1 mL (0.2 mg total) into the skin every 4 (four) hours as needed (excessive secretions).   haloperidol 2 MG/ML solution Commonly known as: HALDOL Place 0.5 mLs (1 mg total) under the tongue every 4 (four) hours as needed for agitation (or delirium).   LORazepam 2 MG/ML concentrated solution Commonly known as: ATIVAN Place 0.5 mLs (1 mg total) under the tongue every 4 (four) hours as needed for anxiety.   morphine CONCENTRATE 10 MG/0.5ML Soln concentrated solution Take 0.25 mLs (5 mg total) by mouth every 2 (two) hours as needed for moderate pain, anxiety or shortness of breath.      Follow-up Information    Caprice Renshaw, MD. Schedule an appointment as soon as possible for a visit.   Specialty: Internal Medicine Why: as needed Contact information: [address] [place] [address] [phone]          No Known Allergies  Consultations:   Palliative care   Other Procedures/Studies: CT ABDOMEN PELVIS WO CONTRAST  Result Date: 12/12/2019 CLINICAL DATA:  Lethargic over the last few days. Diverticulitis suspected clinically. EXAM: CT ABDOMEN AND PELVIS WITHOUT CONTRAST TECHNIQUE: Multidetector CT imaging of the abdomen and pelvis was performed following the standard protocol without IV contrast. COMPARISON:  11/02/2015 FINDINGS:  Lower chest: Large hiatal hernia containing the majority of the stomach. Patchy density at the right lung base could represent mild right lower lobe pneumonia. Hepatobiliary: Chronic cyst of the left lobe. No acute liver parenchymal finding. Previously seen vascular anomaly of the lateral aspect of the right lobe not visible without contrast. Gallbladder is fairly distended but no calcified gallstones are seen. Pancreas: Atrophic changes.  No acute finding. Spleen: Normal Adrenals/Urinary Tract: No adrenal lesion. Simple cyst upper pole left kidney. Simple cyst lower pole left kidney. Few small nonobstructing renal calculi. Catheter in the bladder. The bladder is thick-walled. Stomach/Bowel: Apparent rectal fecal impaction with possible proctitis. Surrounding edema. No sign of diverticulitis. Vascular/Lymphatic: Aortic atherosclerosis. No aneurysm. IVC is normal. No retroperitoneal adenopathy. Reproductive: Negative Other: No free fluid or air. Musculoskeletal: Chronic degenerative changes affect the lumbar spine. Old appearing compression deformities at T9 and T12. IMPRESSION: Large hiatal hernia containing the majority the stomach. Patchy density in the right  lower lobe that could represent mild right lower lobe pneumonia. Fecal impaction with wall thickening of the rectum and surrounding stranding suggesting stercoral proctitis. No evidence of diverticulitis. Bladder catheter in place.  Thick-walled bladder. Aortic atherosclerosis. Electronically Signed   By: Nelson Chimes M.D.   On: 12/12/2019 05:56   CT Head Wo Contrast  Result Date: 12/12/2019 CLINICAL DATA:  Altered mental status. EXAM: CT HEAD WITHOUT CONTRAST TECHNIQUE: Contiguous axial images were obtained from the base of the skull through the vertex without intravenous contrast. COMPARISON:  02/26/2013 FINDINGS: Brain: No evidence of acute infarction, hemorrhage, hydrocephalus, extra-axial collection or mass lesion/mass effect. There is mild diffuse  low-attenuation within the subcortical and periventricular white matter compatible with chronic microvascular disease. Prominent sulci and ventricles compatible with brain atrophy. Vascular: No hyperdense vessel or unexpected calcification. Skull: Normal. Negative for fracture or focal lesion. Sinuses/Orbits: No acute finding. Other: None IMPRESSION: 1. No acute intracranial abnormalities. 2. Chronic small vessel ischemic disease and brain atrophy. Electronically Signed   By: Kerby Moors M.D.   On: 12/12/2019 06:04   DG Chest Port 1 View  Result Date: 12/12/2019 CLINICAL DATA:  Altered mental status, lethargy, dementia EXAM: PORTABLE CHEST 1 VIEW COMPARISON:  10/29/2015 FINDINGS: Lungs are essentially clear. Mild platelike scarring/atelectasis at the right lung base. No pleural effusion or pneumothorax. The heart is normal in size. Moderate hiatal hernia. IMPRESSION: No evidence of acute cardiopulmonary disease. Electronically Signed   By: Julian Hy M.D.   On: 12/12/2019 02:20      TODAY-DAY OF DISCHARGE:  Subjective:   [name] Rice today remains very lethargic does not follow commands.  Objective:   Blood pressure 109/63, pulse 96, temperature 98.9 F (37.2 C), temperature source Oral, resp. rate 14, height 5' (1.524 m), weight 50.1 kg, SpO2 96 %.  Intake/Output Summary (Last 24 hours) at 12/16/2019 1002 Last data filed at 12/16/2019 0102 Gross per 24 hour  Intake --  Output 975 ml  Net -975 ml   Filed Weights   12/12/19 0136 12/14/19 0414  Weight: 49.9 kg 50.1 kg    Exam: Lethargic-does not follow commands   PERTINENT RADIOLOGIC STUDIES: CT ABDOMEN PELVIS WO CONTRAST  Result Date: 12/12/2019 CLINICAL DATA:  Lethargic over the last few days. Diverticulitis suspected clinically. EXAM: CT ABDOMEN AND PELVIS WITHOUT CONTRAST TECHNIQUE: Multidetector CT imaging of the abdomen and pelvis was performed following the standard protocol without IV contrast. COMPARISON:   11/02/2015 FINDINGS: Lower chest: Large hiatal hernia containing the majority of the stomach. Patchy density at the right lung base could represent mild right lower lobe pneumonia. Hepatobiliary: Chronic cyst of the left lobe. No acute liver parenchymal finding. Previously seen vascular anomaly of the lateral aspect of the right lobe not visible without contrast. Gallbladder is fairly distended but no calcified gallstones are seen. Pancreas: Atrophic changes.  No acute finding. Spleen: Normal Adrenals/Urinary Tract: No adrenal lesion. Simple cyst upper pole left kidney. Simple cyst lower pole left kidney. Few small nonobstructing renal calculi. Catheter in the bladder. The bladder is thick-walled. Stomach/Bowel: Apparent rectal fecal impaction with possible proctitis. Surrounding edema. No sign of diverticulitis. Vascular/Lymphatic: Aortic atherosclerosis. No aneurysm. IVC is normal. No retroperitoneal adenopathy. Reproductive: Negative Other: No free fluid or air. Musculoskeletal: Chronic degenerative changes affect the lumbar spine. Old appearing compression deformities at T9 and T12. IMPRESSION: Large hiatal hernia containing the majority the stomach. Patchy density in the right lower lobe that could represent mild right lower lobe pneumonia. Fecal impaction with wall  thickening of the rectum and surrounding stranding suggesting stercoral proctitis. No evidence of diverticulitis. Bladder catheter in place.  Thick-walled bladder. Aortic atherosclerosis. Electronically Signed   By: Nelson Chimes M.D.   On: 12/12/2019 05:56   CT Head Wo Contrast  Result Date: 12/12/2019 CLINICAL DATA:  Altered mental status. EXAM: CT HEAD WITHOUT CONTRAST TECHNIQUE: Contiguous axial images were obtained from the base of the skull through the vertex without intravenous contrast. COMPARISON:  02/26/2013 FINDINGS: Brain: No evidence of acute infarction, hemorrhage, hydrocephalus, extra-axial collection or mass lesion/mass effect.  There is mild diffuse low-attenuation within the subcortical and periventricular white matter compatible with chronic microvascular disease. Prominent sulci and ventricles compatible with brain atrophy. Vascular: No hyperdense vessel or unexpected calcification. Skull: Normal. Negative for fracture or focal lesion. Sinuses/Orbits: No acute finding. Other: None IMPRESSION: 1. No acute intracranial abnormalities. 2. Chronic small vessel ischemic disease and brain atrophy. Electronically Signed   By: Kerby Moors M.D.   On: 12/12/2019 06:04   DG Chest Port 1 View  Result Date: 12/12/2019 CLINICAL DATA:  Altered mental status, lethargy, dementia EXAM: PORTABLE CHEST 1 VIEW COMPARISON:  10/29/2015 FINDINGS: Lungs are essentially clear. Mild platelike scarring/atelectasis at the right lung base. No pleural effusion or pneumothorax. The heart is normal in size. Moderate hiatal hernia. IMPRESSION: No evidence of acute cardiopulmonary disease. Electronically Signed   By: Julian Hy M.D.   On: 12/12/2019 02:20     PERTINENT LAB RESULTS: CBC: Recent Labs    12/14/19 0313 12/15/19 0435  WBC 11.6* 9.9  HGB 13.0 11.3*  HCT 38.1* 33.1*  PLT 215 182   CMET CMP     Component Value Date/Time   NA 150 (H) 12/15/2019 0435   NA 144 01/03/2017 0000   K 3.0 (L) 12/15/2019 0435   CL 117 (H) 12/15/2019 0435   CO2 19 (L) 12/15/2019 0435   GLUCOSE 83 12/15/2019 0435   BUN 27 (H) 12/15/2019 0435   BUN 22 (A) 01/03/2017 0000   CREATININE 1.11 12/15/2019 0435   CALCIUM 8.0 (L) 12/15/2019 0435   PROT 4.8 (L) 12/15/2019 0435   ALBUMIN 2.1 (L) 12/15/2019 0435   AST 67 (H) 12/15/2019 0435   ALT 37 12/15/2019 0435   ALKPHOS 39 12/15/2019 0435   BILITOT 1.7 (H) 12/15/2019 0435   GFRNONAA 57 (L) 12/15/2019 0435   GFRAA >60 12/15/2019 0435    GFR Estimated Creatinine Clearance: 30 mL/min (by C-G formula based on SCr of 1.11 mg/dL). No results for input(s): LIPASE, AMYLASE in the last 72 hours. No  results for input(s): CKTOTAL, CKMB, CKMBINDEX, TROPONINI in the last 72 hours. Invalid input(s): POCBNP Recent Labs    12/14/19 0313  DDIMER 4.43*   No results for input(s): HGBA1C in the last 72 hours. No results for input(s): CHOL, HDL, LDLCALC, TRIG, CHOLHDL, LDLDIRECT in the last 72 hours. No results for input(s): TSH, T4TOTAL, T3FREE, THYROIDAB in the last 72 hours.  Invalid input(s): FREET3 No results for input(s): VITAMINB12, FOLATE, FERRITIN, TIBC, IRON, RETICCTPCT in the last 72 hours. Coags: No results for input(s): INR in the last 72 hours.  Invalid input(s): PT Microbiology: Recent Results (from the past 240 hour(s))  Culture, blood (routine x 2)     Status: None (Preliminary result)   Collection Time: 12/12/19  1:40 AM   Specimen: BLOOD LEFT ARM  Result Value Ref Range Status   Specimen Description BLOOD LEFT ARM  Final   Special Requests   Final  BOTTLES DRAWN AEROBIC AND ANAEROBIC Blood Culture adequate volume   Culture   Final    NO GROWTH 4 DAYS Performed at [facility], [place] [address], [place], [place] [address]    Report Status PENDING  Incomplete  Culture, blood (routine x 2)     Status: None (Preliminary result)   Collection Time: 12/12/19  1:40 AM   Specimen: BLOOD RIGHT ARM  Result Value Ref Range Status   Specimen Description BLOOD RIGHT ARM  Final   Special Requests   Final    BOTTLES DRAWN AEROBIC AND ANAEROBIC Blood Culture adequate volume   Culture   Final    NO GROWTH 4 DAYS Performed at [facility], [place] [address], [place], [place] [address]    Report Status PENDING  Incomplete  SARS CORONAVIRUS 2 (TAT 6-24 HRS) Nasopharyngeal Nasopharyngeal Swab     Status: Abnormal   Collection Time: 12/12/19  3:13 AM   Specimen: Nasopharyngeal Swab  Result Value Ref Range Status   SARS Coronavirus 2 POSITIVE (A) NEGATIVE Final    Comment: RESULT CALLED TO, READ BACK BY AND VERIFIED WITH: CRYSTAL BAIN RN.@1014  ON 2.14.21 BY TCALDWELL  MT. (NOTE) SARS-CoV-2 target nucleic acids are DETECTED. The SARS-CoV-2 RNA is generally detectable in upper and lower respiratory specimens during the acute phase of infection. Positive results are indicative of the presence of SARS-CoV-2 RNA. Clinical correlation with patient history and other diagnostic information is  necessary to determine patient infection status. Positive results do not rule out bacterial infection or co-infection with other viruses.  The expected result is Negative. Fact Sheet for Patients: SugarRoll.be Fact Sheet for Healthcare Providers: https://www.woods-mathews.com/ This test is not yet approved or cleared by the [place] FDA and  has been authorized for detection and/or diagnosis of SARS-CoV-2 by FDA under an Emergency Use Authorization (EUA). This EUA will remain  in effect (meaning this test can be  used) for the duration of the COVID-19 declaration under Section 564(b)(1) of the Act, 21 U.S.C. section 360bbb-3(b)(1), unless the authorization is terminated or revoked sooner. Performed at [facility], [place] [address], [place], [place] [address]   Culture, Urine     Status: None   Collection Time: 12/12/19  3:03 PM   Specimen: Urine, Random  Result Value Ref Range Status   Specimen Description URINE, RANDOM  Final   Special Requests NONE  Final   Culture   Final    NO GROWTH Performed at [facility], [place] [address] [place], [place] [address]    Report Status 12/13/2019 FINAL  Final  C difficile quick scan w PCR reflex     Status: None   Collection Time: 12/12/19  9:50 PM   Specimen: STOOL  Result Value Ref Range Status   C Diff antigen NEGATIVE NEGATIVE Final   C Diff toxin NEGATIVE NEGATIVE Final   C Diff interpretation No C. difficile detected.  Final    Comment: Performed at [facility], [place] [address], [place], [place] [address]  MRSA PCR Screening     Status: None    Collection Time: 12/15/19  7:42 AM   Specimen: Nasopharyngeal  Result Value Ref Range Status   MRSA by PCR NEGATIVE NEGATIVE Final    Comment:        The GeneXpert MRSA Assay (FDA approved for NASAL specimens only), is one component of a comprehensive MRSA colonization surveillance program. It is not intended to diagnose MRSA infection  nor to guide or monitor treatment for MRSA infections. Performed at [facility], [place] [address], [place], [place] [address]     FURTHER DISCHARGE INSTRUCTIONS:  Get Medicines reviewed and adjusted: Please take all your medications with you for your next visit with your Primary MD  Laboratory/radiological data: Please request your Primary MD to go over all hospital tests and procedure/radiological results at the follow up, please ask your Primary MD to get all Hospital records sent to his/her office.  In some cases, they will be blood work, cultures and biopsy results pending at the time of your discharge. Please request that your primary care M.D. goes through all the records of your hospital data and follows up on these results.  Also Note the following: If you experience worsening of your admission symptoms, develop shortness of breath, life threatening emergency, suicidal or homicidal thoughts you must seek medical attention immediately by calling 911 or calling your MD immediately  if symptoms less severe.  You must read complete instructions/literature along with all the possible adverse reactions/side effects for all the Medicines you take and that have been prescribed to you. Take any new Medicines after you have completely understood and accpet all the possible adverse reactions/side effects.   Do not drive when taking Pain medications or sleeping medications (Benzodaizepines)  Do not take more than prescribed Pain, Sleep and Anxiety Medications. It is not advisable to combine anxiety,sleep and pain medications without talking with  your primary care practitioner  Special Instructions: If you have smoked or chewed Tobacco  in the last 2 yrs please stop smoking, stop any regular Alcohol  and or any Recreational drug use.  Wear Seat belts while driving.  Please note: You were cared for by a hospitalist during your hospital stay. Once you are discharged, your primary care physician will handle any further medical issues. Please note that NO REFILLS for any discharge medications will be authorized once you are discharged, as it is imperative that you return to your primary care physician (or establish a relationship with a primary care physician if you do not have one) for your post hospital discharge needs so that they can reassess your need for medications and monitor your lab values.  Total Time spent coordinating discharge including counseling, education and face to face time equals 25 minutes.  SignedOren Binet 12/16/2019 10:02 AM

## 2019-12-16 NOTE — TOC Transition Note (Addendum)
Transition of Care [facility]) - CM/SW Discharge Note   Patient Details  Name: [name] Rice MRN: [id number] Date of Birth: [birth year]  Transition of Care [facility]) CM/SW Contact:  Benard Halsted, LCSW Phone Number: 12/16/2019, 10:26 AM   Clinical Narrative:    Patient will DC to: [facility] Anticipated DC date: 12/16/19 Family notified: Patriciaann Clan Transport by: Corey Harold   Please make sure DNR and signed scripts go with patient.   Per MD patient ready for DC to [facility] with Hospice (SNF to arrange). RN, patient, patient's family, and facility notified of DC. Discharge Summary and FL2 sent to facility. RN to call report prior to discharge [phone] Room 114). DC packet on chart. Ambulance transport requested for patient.   CSW will sign off for now as social work intervention is no longer needed. Please consult [place] again if new needs arise.  Cedric Fishman, LCSW Clinical Social Worker [phone]    Final next level of care: Skilled Nursing Facility Barriers to Discharge: No Barriers Identified   Patient Goals and CMS Choice Patient states their goals for this hospitalization and ongoing recovery are:: Return to SNF CMS Medicare.gov Compare Post Acute Care list provided to:: Legal Guardian(Nicole) Choice offered to / list presented to : [place] / Guardian  Discharge Placement   Existing PASRR number confirmed : 12/16/19          Patient [facility] bed at: [facility] Patient to be transferred to facility by: [place] Name of family member notified: Patriciaann Clan Patient and family notified of of transfer: 12/16/19  Discharge Plan and Services In-house Referral: Clinical Social Work   Post Acute Care Choice: [place]                               Social Determinants of Health (SDOH) Interventions     Readmission Risk Interventions No flowsheet data found.

## 2019-12-16 NOTE — Progress Notes (Signed)
Patient was discharged to SNF [facility] rm 114 by MD order; discharged instructions  review and given to [facility] staff with care notes and prescriptions; IV DC;patient will be transported via stretcher accompanied by [facility] staff. -Report called to [facility], all questions answered.

## 2019-12-17 LAB — CULTURE, BLOOD (ROUTINE X 2)
Culture: NO GROWTH
Culture: NO GROWTH
Special Requests: ADEQUATE
Special Requests: ADEQUATE

## 2019-12-27 DEATH — deceased
